# Patient Record
Sex: Male | Born: 1940
Health system: Southern US, Community
[De-identification: ages and names within clinical notes are randomized; demographics above are authoritative.]

## PROBLEM LIST (undated history)

## (undated) DIAGNOSIS — H409 Unspecified glaucoma: Secondary | ICD-10-CM

## (undated) DIAGNOSIS — H269 Unspecified cataract: Secondary | ICD-10-CM

## (undated) DIAGNOSIS — C9110 Chronic lymphocytic leukemia of B-cell type not having achieved remission: Principal | ICD-10-CM

## (undated) DIAGNOSIS — I4891 Unspecified atrial fibrillation: Secondary | ICD-10-CM

## (undated) HISTORY — DX: Chronic lymphocytic leukemia of B-cell type not having achieved remission: C91.10

## (undated) HISTORY — PX: TRANSTHORACIC ECHOCARDIOGRAM: SHX275

## (undated) HISTORY — PX: GUM SURGERY: SHX658

## (undated) HISTORY — DX: Unspecified cataract: H26.9

## (undated) HISTORY — PX: COLONOSCOPY: SHX174

## (undated) HISTORY — DX: Unspecified glaucoma: H40.9

## (undated) HISTORY — PX: TONSILLECTOMY: SUR1361

---

## 1997-08-17 HISTORY — PX: ROTATOR CUFF REPAIR: SHX139

## 1998-05-13 ENCOUNTER — Other Ambulatory Visit: Admission: RE | Admit: 1998-05-13 | Discharge: 1998-05-13 | Payer: Self-pay | Admitting: Hematology & Oncology

## 2001-12-12 ENCOUNTER — Encounter: Admission: RE | Admit: 2001-12-12 | Discharge: 2002-03-12 | Payer: Self-pay | Admitting: Internal Medicine

## 2004-10-28 ENCOUNTER — Ambulatory Visit: Payer: Self-pay | Admitting: Hematology & Oncology

## 2005-04-28 ENCOUNTER — Ambulatory Visit: Payer: Self-pay | Admitting: Hematology & Oncology

## 2005-11-04 ENCOUNTER — Ambulatory Visit: Payer: Self-pay | Admitting: Hematology & Oncology

## 2006-04-28 ENCOUNTER — Ambulatory Visit: Payer: Self-pay | Admitting: Hematology & Oncology

## 2006-04-30 LAB — CBC WITH DIFFERENTIAL/PLATELET
MCHC: 33.3 g/dL (ref 32.0–35.9)
MCV: 96.8 fL (ref 81.6–98.0)
Platelets: 207 10*3/uL (ref 145–400)
RBC: 4.17 10*6/uL — ABNORMAL LOW (ref 4.20–5.71)
RDW: 12.7 % (ref 11.2–14.6)
WBC: 24.8 10*3/uL — ABNORMAL HIGH (ref 4.0–10.0)

## 2006-04-30 LAB — MANUAL DIFFERENTIAL
ALC: 19.3 10*3/uL — ABNORMAL HIGH (ref 0.9–3.3)
ANC (CHCC manual diff): 5.2 10*3/uL (ref 1.5–6.5)
LYMPH: 78 % — ABNORMAL HIGH (ref 14–49)
MONO: 1 % (ref 0–14)
PLT EST: ADEQUATE
SEG: 21 % — ABNORMAL LOW (ref 38–77)
nRBC: 0 % (ref 0–0)

## 2006-10-26 ENCOUNTER — Ambulatory Visit: Payer: Self-pay | Admitting: Hematology & Oncology

## 2006-10-29 LAB — CBC WITH DIFFERENTIAL/PLATELET
BASO%: 0.2 % (ref 0.0–2.0)
Eosinophils Absolute: 0 10*3/uL (ref 0.0–0.5)
LYMPH%: 87.7 % — ABNORMAL HIGH (ref 14.0–48.0)
MCHC: 33.3 g/dL (ref 32.0–35.9)
MONO#: 0.2 10*3/uL (ref 0.1–0.9)
NEUT#: 2.3 10*3/uL (ref 1.5–6.5)
RBC: 4.62 10*6/uL (ref 4.20–5.71)
RDW: 15.3 % — ABNORMAL HIGH (ref 11.2–14.6)
WBC: 21.2 10*3/uL — ABNORMAL HIGH (ref 4.0–10.0)
lymph#: 18.6 10*3/uL — ABNORMAL HIGH (ref 0.9–3.3)

## 2006-10-29 LAB — CHCC SMEAR

## 2007-05-17 ENCOUNTER — Ambulatory Visit: Payer: Self-pay | Admitting: Hematology & Oncology

## 2007-05-20 LAB — CBC WITH DIFFERENTIAL/PLATELET
BASO%: 0.4 % (ref 0.0–2.0)
EOS%: 0.1 % (ref 0.0–7.0)
Eosinophils Absolute: 0 10*3/uL (ref 0.0–0.5)
MCH: 33 pg (ref 28.0–33.4)
MCHC: 34.3 g/dL (ref 32.0–35.9)
MCV: 96.4 fL (ref 81.6–98.0)
MONO%: 2.2 % (ref 0.0–13.0)
NEUT#: 3.7 10*3/uL (ref 1.5–6.5)
RBC: 4.42 10*6/uL (ref 4.20–5.71)
RDW: 13.9 % (ref 11.2–14.6)

## 2007-05-20 LAB — CHCC SMEAR

## 2007-10-31 ENCOUNTER — Ambulatory Visit: Payer: Self-pay | Admitting: Gastroenterology

## 2007-11-09 ENCOUNTER — Ambulatory Visit: Payer: Self-pay | Admitting: Internal Medicine

## 2007-11-16 ENCOUNTER — Ambulatory Visit: Payer: Self-pay | Admitting: Hematology & Oncology

## 2007-11-18 LAB — CBC WITH DIFFERENTIAL/PLATELET
BASO%: 0.2 % (ref 0.0–2.0)
EOS%: 0.1 % (ref 0.0–7.0)
HCT: 42.1 % (ref 38.7–49.9)
MCH: 32.1 pg (ref 28.0–33.4)
MCHC: 33.4 g/dL (ref 32.0–35.9)
NEUT%: 15.1 % — ABNORMAL LOW (ref 40.0–75.0)
lymph#: 20.3 10*3/uL — ABNORMAL HIGH (ref 0.9–3.3)

## 2008-05-17 ENCOUNTER — Ambulatory Visit: Payer: Self-pay | Admitting: Hematology & Oncology

## 2008-05-18 LAB — CBC WITH DIFFERENTIAL (CANCER CENTER ONLY)
EOS%: 0.2 % (ref 0.0–7.0)
LYMPH%: 86.9 % — ABNORMAL HIGH (ref 14.0–48.0)
MCH: 32.2 pg (ref 28.0–33.4)
MCHC: 33.7 g/dL (ref 32.0–35.9)
MCV: 95 fL (ref 82–98)
MONO%: 2.4 % (ref 0.0–13.0)
NEUT#: 2.5 10*3/uL (ref 1.5–6.5)
Platelets: 120 10*3/uL — ABNORMAL LOW (ref 145–400)
RDW: 11.5 % (ref 10.5–14.6)

## 2008-05-18 LAB — CHCC SATELLITE - SMEAR

## 2008-05-18 LAB — TECHNOLOGIST REVIEW CHCC SATELLITE

## 2008-07-19 ENCOUNTER — Ambulatory Visit: Payer: Self-pay | Admitting: Hematology & Oncology

## 2008-07-20 LAB — CBC WITH DIFFERENTIAL (CANCER CENTER ONLY)
BASO#: 1 10*3/uL — ABNORMAL HIGH (ref 0.0–0.2)
EOS%: 0.1 % (ref 0.0–7.0)
HCT: 44.4 % (ref 38.7–49.9)
HGB: 14.8 g/dL (ref 13.0–17.1)
LYMPH%: 87.8 % — ABNORMAL HIGH (ref 14.0–48.0)
MCH: 32.6 pg (ref 28.0–33.4)
MCHC: 33.4 g/dL (ref 32.0–35.9)
MCV: 98 fL (ref 82–98)
MONO%: 3 % (ref 0.0–13.0)
NEUT#: 2.3 10*3/uL (ref 1.5–6.5)
NEUT%: 6.4 % — ABNORMAL LOW (ref 40.0–80.0)

## 2008-07-24 LAB — IGG, IGA, IGM
IgA: 67 mg/dL — ABNORMAL LOW (ref 68–378)
IgG (Immunoglobin G), Serum: 888 mg/dL (ref 694–1618)

## 2008-07-24 LAB — PROTEIN ELECTROPHORESIS, SERUM
Albumin ELP: 64.9 % (ref 55.8–66.1)
Alpha-1-Globulin: 3.6 % (ref 2.9–4.9)
Alpha-2-Globulin: 9.7 % (ref 7.1–11.8)
Total Protein, Serum Electrophoresis: 6.6 g/dL (ref 6.0–8.3)

## 2008-08-17 HISTORY — PX: CYST REMOVAL TRUNK: SHX6283

## 2008-10-04 ENCOUNTER — Ambulatory Visit (HOSPITAL_BASED_OUTPATIENT_CLINIC_OR_DEPARTMENT_OTHER): Admission: RE | Admit: 2008-10-04 | Discharge: 2008-10-04 | Payer: Self-pay | Admitting: General Surgery

## 2008-10-04 ENCOUNTER — Encounter (INDEPENDENT_AMBULATORY_CARE_PROVIDER_SITE_OTHER): Payer: Self-pay | Admitting: General Surgery

## 2008-10-11 ENCOUNTER — Ambulatory Visit: Payer: Self-pay | Admitting: Hematology & Oncology

## 2008-10-12 LAB — CBC WITH DIFFERENTIAL (CANCER CENTER ONLY)
BASO#: 1.3 10*3/uL — ABNORMAL HIGH (ref 0.0–0.2)
EOS%: 0.2 % (ref 0.0–7.0)
Eosinophils Absolute: 0.1 10*3/uL (ref 0.0–0.5)
HCT: 47.1 % (ref 38.7–49.9)
HGB: 15.3 g/dL (ref 13.0–17.1)
LYMPH#: 31.8 10*3/uL — ABNORMAL HIGH (ref 0.9–3.3)
MCHC: 32.4 g/dL (ref 32.0–35.9)
NEUT#: 2.3 10*3/uL (ref 1.5–6.5)
NEUT%: 6.4 % — ABNORMAL LOW (ref 40.0–80.0)
RBC: 4.71 10*6/uL (ref 4.20–5.70)

## 2008-10-12 LAB — CHCC SATELLITE - SMEAR

## 2008-10-12 LAB — TECHNOLOGIST REVIEW CHCC SATELLITE

## 2008-12-20 ENCOUNTER — Ambulatory Visit: Payer: Self-pay | Admitting: Hematology & Oncology

## 2008-12-21 LAB — CBC WITH DIFFERENTIAL (CANCER CENTER ONLY)
BASO#: 0.5 10*3/uL — ABNORMAL HIGH (ref 0.0–0.2)
BASO%: 1.7 % (ref 0.0–2.0)
HCT: 42.1 % (ref 38.7–49.9)
HGB: 14 g/dL (ref 13.0–17.1)
LYMPH#: 23.2 10*3/uL — ABNORMAL HIGH (ref 0.9–3.3)
MONO#: 0.4 10*3/uL (ref 0.1–0.9)
NEUT#: 3 10*3/uL (ref 1.5–6.5)
NEUT%: 11.1 % — ABNORMAL LOW (ref 40.0–80.0)
RDW: 11.4 % (ref 10.5–14.6)
WBC: 27.2 10*3/uL — ABNORMAL HIGH (ref 4.0–10.0)

## 2008-12-21 LAB — TECHNOLOGIST REVIEW CHCC SATELLITE

## 2009-04-24 ENCOUNTER — Ambulatory Visit: Payer: Self-pay | Admitting: Hematology & Oncology

## 2009-04-25 LAB — CBC WITH DIFFERENTIAL (CANCER CENTER ONLY)
BASO%: 2.4 % — ABNORMAL HIGH (ref 0.0–2.0)
EOS%: 0.2 % (ref 0.0–7.0)
LYMPH#: 27.7 10*3/uL — ABNORMAL HIGH (ref 0.9–3.3)
MONO#: 0.7 10*3/uL (ref 0.1–0.9)
Platelets: 128 10*3/uL — ABNORMAL LOW (ref 145–400)
RDW: 11.8 % (ref 10.5–14.6)
WBC: 31.5 10*3/uL — ABNORMAL HIGH (ref 4.0–10.0)

## 2009-04-25 LAB — TECHNOLOGIST REVIEW CHCC SATELLITE

## 2009-08-27 ENCOUNTER — Ambulatory Visit: Payer: Self-pay | Admitting: Hematology & Oncology

## 2009-08-29 LAB — CBC WITH DIFFERENTIAL (CANCER CENTER ONLY)
BASO#: 0.6 10*3/uL — ABNORMAL HIGH (ref 0.0–0.2)
BASO%: 2 % (ref 0.0–2.0)
EOS%: 0.3 % (ref 0.0–7.0)
Eosinophils Absolute: 0.1 10*3/uL (ref 0.0–0.5)
HCT: 46.4 % (ref 38.7–49.9)
HGB: 15.1 g/dL (ref 13.0–17.1)
LYMPH#: 28.4 10*3/uL — ABNORMAL HIGH (ref 0.9–3.3)
LYMPH%: 88.7 % — ABNORMAL HIGH (ref 14.0–48.0)
MCH: 32 pg (ref 28.0–33.4)
MCHC: 32.6 g/dL (ref 32.0–35.9)
MCV: 98 fL (ref 82–98)
MONO#: 0.8 10*3/uL (ref 0.1–0.9)
MONO%: 2.4 % (ref 0.0–13.0)
NEUT#: 2.1 10*3/uL (ref 1.5–6.5)
NEUT%: 6.6 % — ABNORMAL LOW (ref 40.0–80.0)
Platelets: 120 10*3/uL — ABNORMAL LOW (ref 145–400)
RBC: 4.73 10*6/uL (ref 4.20–5.70)
RDW: 11.2 % (ref 10.5–14.6)
WBC: 32 10*3/uL — ABNORMAL HIGH (ref 4.0–10.0)

## 2009-08-29 LAB — CHCC SATELLITE - SMEAR

## 2009-12-31 ENCOUNTER — Ambulatory Visit: Payer: Self-pay | Admitting: Hematology & Oncology

## 2010-01-02 LAB — CBC WITH DIFFERENTIAL (CANCER CENTER ONLY)
BASO#: 0.8 10*3/uL — ABNORMAL HIGH (ref 0.0–0.2)
BASO%: 2.6 % — ABNORMAL HIGH (ref 0.0–2.0)
EOS%: 0.2 % (ref 0.0–7.0)
Eosinophils Absolute: 0.1 10*3/uL (ref 0.0–0.5)
HCT: 45.6 % (ref 38.7–49.9)
HGB: 15.1 g/dL (ref 13.0–17.1)
LYMPH#: 27.4 10*3/uL — ABNORMAL HIGH (ref 0.9–3.3)
LYMPH%: 87.7 % — ABNORMAL HIGH (ref 14.0–48.0)
MCH: 32.5 pg (ref 28.0–33.4)
MCHC: 33.1 g/dL (ref 32.0–35.9)
MCV: 98 fL (ref 82–98)
MONO#: 0.6 10*3/uL (ref 0.1–0.9)
MONO%: 2 % (ref 0.0–13.0)
NEUT#: 2.4 10*3/uL (ref 1.5–6.5)
NEUT%: 7.5 % — ABNORMAL LOW (ref 40.0–80.0)
Platelets: 103 10*3/uL — ABNORMAL LOW (ref 145–400)
RBC: 4.65 10*6/uL (ref 4.20–5.70)
RDW: 11.1 % (ref 10.5–14.6)
WBC: 31.3 10*3/uL — ABNORMAL HIGH (ref 4.0–10.0)

## 2010-01-02 LAB — CHCC SATELLITE - SMEAR

## 2010-04-29 ENCOUNTER — Ambulatory Visit: Payer: Self-pay | Admitting: Hematology & Oncology

## 2010-08-17 HISTORY — PX: CATARACT EXTRACTION: SUR2

## 2010-08-26 ENCOUNTER — Ambulatory Visit: Payer: Self-pay | Admitting: Hematology & Oncology

## 2010-08-28 LAB — CBC WITH DIFFERENTIAL (CANCER CENTER ONLY)
BASO#: 0.8 10*3/uL — ABNORMAL HIGH (ref 0.0–0.2)
BASO%: 2.4 % — ABNORMAL HIGH (ref 0.0–2.0)
EOS%: 0.2 % (ref 0.0–7.0)
Eosinophils Absolute: 0.1 10*3/uL (ref 0.0–0.5)
HCT: 46.7 % (ref 38.7–49.9)
HGB: 15.2 g/dL (ref 13.0–17.1)
LYMPH#: 30.4 10*3/uL — ABNORMAL HIGH (ref 0.9–3.3)
LYMPH%: 86.7 % — ABNORMAL HIGH (ref 14.0–48.0)
MCH: 32 pg (ref 28.0–33.4)
MCHC: 32.6 g/dL (ref 32.0–35.9)
MCV: 98 fL (ref 82–98)
MONO#: 0.9 10*3/uL (ref 0.1–0.9)
MONO%: 2.6 % (ref 0.0–13.0)
NEUT#: 2.8 10*3/uL (ref 1.5–6.5)
NEUT%: 8.1 % — ABNORMAL LOW (ref 40.0–80.0)
Platelets: 131 10*3/uL — ABNORMAL LOW (ref 145–400)
RBC: 4.75 10*6/uL (ref 4.20–5.70)
RDW: 10.8 % (ref 10.5–14.6)
WBC: 35.1 10*3/uL — ABNORMAL HIGH (ref 4.0–10.0)

## 2010-08-28 LAB — COMPREHENSIVE METABOLIC PANEL
ALT: 14 U/L (ref 0–53)
AST: 13 U/L (ref 0–37)
Albumin: 4.2 g/dL (ref 3.5–5.2)
Alkaline Phosphatase: 64 U/L (ref 39–117)
BUN: 16 mg/dL (ref 6–23)
CO2: 30 mEq/L (ref 19–32)
Calcium: 8.8 mg/dL (ref 8.4–10.5)
Chloride: 102 mEq/L (ref 96–112)
Creatinine, Ser: 0.97 mg/dL (ref 0.40–1.50)
Glucose, Bld: 244 mg/dL — ABNORMAL HIGH (ref 70–99)
Potassium: 4.3 mEq/L (ref 3.5–5.3)
Sodium: 142 mEq/L (ref 135–145)
Total Bilirubin: 0.5 mg/dL (ref 0.3–1.2)
Total Protein: 6.8 g/dL (ref 6.0–8.3)

## 2010-08-28 LAB — CHCC SATELLITE - SMEAR

## 2010-08-28 LAB — TECHNOLOGIST REVIEW CHCC SATELLITE

## 2010-12-02 LAB — POCT HEMOGLOBIN-HEMACUE: Hemoglobin: 14.6 g/dL (ref 13.0–17.0)

## 2010-12-11 ENCOUNTER — Encounter (HOSPITAL_BASED_OUTPATIENT_CLINIC_OR_DEPARTMENT_OTHER): Payer: Medicare Other | Admitting: Hematology & Oncology

## 2010-12-11 ENCOUNTER — Other Ambulatory Visit: Payer: Self-pay | Admitting: Hematology & Oncology

## 2010-12-11 DIAGNOSIS — H318 Other specified disorders of choroid: Secondary | ICD-10-CM

## 2010-12-11 DIAGNOSIS — C9110 Chronic lymphocytic leukemia of B-cell type not having achieved remission: Secondary | ICD-10-CM

## 2010-12-11 LAB — MANUAL DIFFERENTIAL (CHCC SATELLITE)
ALC: 23.8 10*3/uL — ABNORMAL HIGH (ref 0.9–3.3)
ANC (CHCC HP manual diff): 2.7 10*3/uL (ref 1.5–6.5)
Platelet Morphology: NORMAL

## 2010-12-11 LAB — CBC WITH DIFFERENTIAL (CANCER CENTER ONLY)
MCHC: 32.1 g/dL (ref 32.0–35.9)
Platelets: 115 10*3/uL — ABNORMAL LOW (ref 145–400)
RDW: 15.1 % (ref 11.1–15.7)

## 2010-12-11 LAB — LACTATE DEHYDROGENASE: LDH: 95 U/L (ref 94–250)

## 2010-12-11 LAB — CHCC SATELLITE - SMEAR

## 2010-12-30 NOTE — Op Note (Signed)
NAME:  Lucas [name], HANDEL NO.:  [id number]   MEDICAL RECORD NO.:  [id number]          PATIENT TYPE:  AMB   LOCATION:  DSC                          FACILITY:  [facility]   PHYSICIAN:  Gabrielle Dare. Janee Morn, M.D.DATE OF BIRTH:  10/12/1940   DATE OF PROCEDURE:  10/04/2008  DATE OF DISCHARGE:                               OPERATIVE REPORT   PREOPERATIVE DIAGNOSIS:  Sebaceous cyst, back.   POSTOPERATIVE DIAGNOSIS:  Sebaceous cyst, back.   PROCEDURE:  Excision of sebaceous cyst of the back.   SURGEON:  Gabrielle Dare. Janee Morn, M.D.   ANESTHESIA:  MAC.   HISTORY OF PRESENT ILLNESS:  Mr. Onstott is a 70 year old gentleman with  a history of CLL, who I initially saw in the office for an infected  sebaceous cyst.  He underwent incision and drainage and has completed a  course of antibiotics.  On followup, the acute infection was completely  resolved.  He was then scheduled for elective excision in light of his  medical history to prevent infectious complications and recurrence.   PROCEDURE IN DETAIL:  Informed consent was obtained.  The patient was  identified in the preop holding area.  His site was marked.  He was  brought to the operating room.  MAC anesthesia was administered by the  Anesthesia staff to his back.  He was prepped and draped in sterile  fashion.  A mixture of 1% lidocaine with epinephrine and 0.25% Marcaine  plain was injected under and around the cyst area.  An elliptical skin  incision was made to encompass the entirety of the old I and D site and  previous sinus.  Subcutaneous tissues were dissected down, and the cyst  was dissected out completely intact from the subcutaneous tissues.  It  did extend more cephalad.  This was sent to Pathology.  The wound was  copiously irrigated and meticulous hemostasis was obtained using Bovie  cautery.  The wound was then closed in layers with subcutaneous tissues  approximated with interrupted 3-0 Vicryl suture and the skin  closed with  running 4-0 Monocryl subcuticular stitch followed by Dermabond.  The  patient tolerated the procedure well without apparent complication.  He  was taken to recovery room in stable condition.      Gabrielle Dare Janee Morn, M.D.  Electronically Signed     BET/MEDQ  D:  10/04/2008  T:  10/04/2008  Job:  [id number]   cc:   Rose Phi. Myna Hidalgo, M.D.  Geoffry Paradise, M.D.

## 2011-04-10 ENCOUNTER — Other Ambulatory Visit: Payer: Self-pay | Admitting: Dermatology

## 2011-05-04 ENCOUNTER — Other Ambulatory Visit: Payer: Self-pay | Admitting: Hematology & Oncology

## 2011-05-04 ENCOUNTER — Encounter (HOSPITAL_BASED_OUTPATIENT_CLINIC_OR_DEPARTMENT_OTHER): Payer: Medicare Other | Admitting: Hematology & Oncology

## 2011-05-04 DIAGNOSIS — D696 Thrombocytopenia, unspecified: Secondary | ICD-10-CM

## 2011-05-04 DIAGNOSIS — C9110 Chronic lymphocytic leukemia of B-cell type not having achieved remission: Secondary | ICD-10-CM

## 2011-05-04 DIAGNOSIS — H318 Other specified disorders of choroid: Secondary | ICD-10-CM

## 2011-05-04 LAB — CHCC SATELLITE - SMEAR

## 2011-05-04 LAB — MANUAL DIFFERENTIAL (CHCC SATELLITE)
ALC: 26.6 10*3/uL — ABNORMAL HIGH (ref 0.9–3.3)
PLT EST ~~LOC~~: DECREASED
Platelet Morphology: NORMAL
SEG: 12 % — ABNORMAL LOW (ref 40–75)

## 2011-05-04 LAB — CBC WITH DIFFERENTIAL (CANCER CENTER ONLY)
Platelets: 105 10*3/uL — ABNORMAL LOW (ref 145–400)
RBC: 4.61 10*6/uL (ref 4.20–5.70)
RDW: 14.5 % (ref 11.1–15.7)
WBC: 30.6 10*3/uL — ABNORMAL HIGH (ref 4.0–10.0)

## 2011-05-15 ENCOUNTER — Other Ambulatory Visit: Payer: Self-pay | Admitting: Dermatology

## 2011-08-07 ENCOUNTER — Telehealth: Payer: Self-pay | Admitting: Hematology & Oncology

## 2011-08-07 ENCOUNTER — Telehealth: Payer: Self-pay | Admitting: *Deleted

## 2011-08-07 NOTE — Telephone Encounter (Signed)
I left a message telling him that NO live viral vaccines should be administered.  Yellow fever, oral typhoid, MMR, zoster and oral polio are live vaccines.  Any other vaccine should be ok.  I told him to call back with any questions.  Lucas [name]

## 2011-08-07 NOTE — Telephone Encounter (Signed)
Pt called requesting to speak with Dr Myna Hidalgo. He is going to [place] in Feb and there are questions regarding some of the injections he has to have because of his CLL. He can be reached at [phone]. Message given to Dr Myna Hidalgo.

## 2011-08-25 DIAGNOSIS — Z23 Encounter for immunization: Secondary | ICD-10-CM | POA: Diagnosis not present

## 2011-09-03 ENCOUNTER — Encounter: Payer: Self-pay | Admitting: Hematology & Oncology

## 2011-09-03 ENCOUNTER — Ambulatory Visit (HOSPITAL_BASED_OUTPATIENT_CLINIC_OR_DEPARTMENT_OTHER): Payer: Medicare Other | Admitting: Hematology & Oncology

## 2011-09-03 ENCOUNTER — Other Ambulatory Visit (HOSPITAL_BASED_OUTPATIENT_CLINIC_OR_DEPARTMENT_OTHER): Payer: Medicare Other | Admitting: Lab

## 2011-09-03 VITALS — BP 109/72 | HR 70 | Temp 97.6°F | Ht 71.25 in | Wt 168.0 lb

## 2011-09-03 DIAGNOSIS — C9110 Chronic lymphocytic leukemia of B-cell type not having achieved remission: Secondary | ICD-10-CM

## 2011-09-03 DIAGNOSIS — IMO0001 Reserved for inherently not codable concepts without codable children: Secondary | ICD-10-CM | POA: Insufficient documentation

## 2011-09-03 DIAGNOSIS — H318 Other specified disorders of choroid: Secondary | ICD-10-CM | POA: Diagnosis not present

## 2011-09-03 HISTORY — DX: Chronic lymphocytic leukemia of B-cell type not having achieved remission: C91.10

## 2011-09-03 LAB — MANUAL DIFFERENTIAL (CHCC SATELLITE)
ALC: 29.7 10*3/uL — ABNORMAL HIGH (ref 0.9–3.3)
PLT EST ~~LOC~~: DECREASED

## 2011-09-03 LAB — CBC WITH DIFFERENTIAL (CANCER CENTER ONLY)
MCV: 98 fL (ref 82–98)
Platelets: 105 10*3/uL — ABNORMAL LOW (ref 145–400)
RDW: 14.4 % (ref 11.1–15.7)
WBC: 32.3 10*3/uL — ABNORMAL HIGH (ref 4.0–10.0)

## 2011-09-03 NOTE — Progress Notes (Signed)
This office note has been dictated.

## 2011-09-03 NOTE — Progress Notes (Signed)
CC:   Geoffry Paradise, M.D. Bernette Redbird, M.D. Casper Harrison, MD Rodrigo Ran, OD  DIAGNOSES: 1. Chronic lymphocytic leukemia, stage B. 2. Chorioretinitis of the right eye. 3. Hematochezia.  CURRENT THERAPY:  Observation.  INTERIM HISTORY:  Mr. Turgeon comes in for followup.  We last saw him in September.  Since then, he has been doing okay.  Unfortunately, he has noted some bright red blood per rectum over the past couple days.  It is painless.  This has not happened to him before.  He says there is no blood on the tissue paper.  His last colonoscopy was about 4 years ago.  He has had no other issues.  He says his chorioretinitis is not much of a problem right now.  He is getting ready go to [place] in February.  I talked to him about which vaccines that he can and cannot take.  Given his CLL, live vaccines may not be a good idea for him.  He has had no cough or shortness of breath.  He had no problems over the holidays.  He has had a good appetite.  No nausea or vomiting.  PHYSICAL EXAM:  General:  This is a well-developed, well-nourished white gentleman in no obvious distress.  Vital Signs:  Temperature 97.6, pulse 70, respiratory rate 16, blood pressure 109/72.  Weight 168.  Head/Neck: Exam shows a normocephalic, atraumatic skull.  There are no ocular or oral lesions.  I did not notice any erythema or inflammation of the right.  Pupils react appropriately.  His left supraclavicular lymph node really is barely palpable.  I cannot palpate his left submandibular lymph node at this point time.  Lungs:  Clear bilaterally.  Cardiac: Regular rate and rhythm with a normal S1, S2.  There are no murmurs, rubs, or bruits.  Axillae:  Exam shows no bilateral axillary adenopathy. Abdomen:  Soft with good bowel sounds.  There is no fluid wave.  There is no guarding or rebound tenderness.  There is no palpable hepatosplenomegaly.  Rectal:  Exam shows very small external hemorrhoids.   The prostate is very smooth.  There are no rectal masses. His stool is brown with some focal areas of blood.  His guaiac, however, is negative.  Extremities:  No clubbing, cyanosis or edema.  Neurologic: No focal neurological deficits.  LABORATORY STUDIES:  White cell count is 32.1, hemoglobin 15.4, hematocrit 46, platelet count 105.  White cell differential shows 22 lymphocytes, 7 segs.  IMPRESSION:  Mr. Weissinger is a 71 year old gentleman with a history of CLL.  This really has not been an issue for him.  I have been following him for many years.  His white cell count has been trending up slowly, but again, he has been asymptomatic.  His lymphadenopathy seems to "wax and wane."  I am not sure what to make of his rectal bleeding.  Again, his stool looks like there were streaks of blood, but on the guaiac test, it was negative.  I did speak to Dr. Matthias Hughs of gastroenterology.  He would like to see Mr. Livsey for an evaluation and likely colonoscopy.  I gave Dr. Matthias Hughs Mr. Ormond's phone number.  Otherwise, we will have Mr. Kundrat come back to see [place] in 3-4 months.  I do not see any problems with him going over to [place] with respect to the CLL.  I do want to make sure, however, that this rectal issue is addressed.    ______________________________ Josph Macho, M.D. PRE/MEDQ  D:  09/03/2011  T:  09/03/2011  Job:  [id number]  ADDENDUM:  Mr. Dargis called and told us that he and wife had beets before he noted the "blood" in the stool.  This could explain the (-) guaiac test.

## 2011-09-08 LAB — PROTEIN ELECTROPHORESIS, SERUM
Albumin ELP: 64.3 % (ref 55.8–66.1)
Alpha-1-Globulin: 3.5 % (ref 2.9–4.9)
Alpha-2-Globulin: 9.6 % (ref 7.1–11.8)
Beta 2: 3.5 % (ref 3.2–6.5)
Beta Globulin: 5.3 % (ref 4.7–7.2)
Gamma Globulin: 13.8 % (ref 11.1–18.8)

## 2011-09-09 ENCOUNTER — Emergency Department (HOSPITAL_COMMUNITY): Payer: Medicare Other

## 2011-09-09 ENCOUNTER — Inpatient Hospital Stay (HOSPITAL_COMMUNITY)
Admission: EM | Admit: 2011-09-09 | Discharge: 2011-09-10 | DRG: 309 | Disposition: A | Discharge status: Home / Self Care | Payer: Medicare Other | Attending: Cardiovascular Disease | Admitting: Cardiovascular Disease

## 2011-09-09 ENCOUNTER — Encounter (HOSPITAL_COMMUNITY): Payer: Self-pay | Admitting: *Deleted

## 2011-09-09 DIAGNOSIS — Z91013 Allergy to seafood: Secondary | ICD-10-CM

## 2011-09-09 DIAGNOSIS — R5383 Other fatigue: Secondary | ICD-10-CM | POA: Diagnosis not present

## 2011-09-09 DIAGNOSIS — Z7982 Long term (current) use of aspirin: Secondary | ICD-10-CM

## 2011-09-09 DIAGNOSIS — R55 Syncope and collapse: Secondary | ICD-10-CM | POA: Diagnosis not present

## 2011-09-09 DIAGNOSIS — H209 Unspecified iridocyclitis: Secondary | ICD-10-CM | POA: Diagnosis not present

## 2011-09-09 DIAGNOSIS — R5381 Other malaise: Secondary | ICD-10-CM | POA: Diagnosis not present

## 2011-09-09 DIAGNOSIS — IMO0001 Reserved for inherently not codable concepts without codable children: Secondary | ICD-10-CM | POA: Diagnosis present

## 2011-09-09 DIAGNOSIS — H409 Unspecified glaucoma: Secondary | ICD-10-CM | POA: Diagnosis present

## 2011-09-09 DIAGNOSIS — C9110 Chronic lymphocytic leukemia of B-cell type not having achieved remission: Secondary | ICD-10-CM | POA: Diagnosis present

## 2011-09-09 DIAGNOSIS — Z79899 Other long term (current) drug therapy: Secondary | ICD-10-CM

## 2011-09-09 DIAGNOSIS — E119 Type 2 diabetes mellitus without complications: Secondary | ICD-10-CM | POA: Diagnosis present

## 2011-09-09 DIAGNOSIS — I4891 Unspecified atrial fibrillation: Secondary | ICD-10-CM | POA: Diagnosis not present

## 2011-09-09 DIAGNOSIS — Z87892 Personal history of anaphylaxis: Secondary | ICD-10-CM

## 2011-09-09 DIAGNOSIS — D7289 Other specified disorders of white blood cells: Secondary | ICD-10-CM | POA: Diagnosis not present

## 2011-09-09 DIAGNOSIS — Z9849 Cataract extraction status, unspecified eye: Secondary | ICD-10-CM

## 2011-09-09 DIAGNOSIS — I959 Hypotension, unspecified: Secondary | ICD-10-CM | POA: Diagnosis not present

## 2011-09-09 DIAGNOSIS — I480 Paroxysmal atrial fibrillation: Secondary | ICD-10-CM | POA: Diagnosis present

## 2011-09-09 DIAGNOSIS — R42 Dizziness and giddiness: Secondary | ICD-10-CM | POA: Diagnosis not present

## 2011-09-09 LAB — CBC
HCT: 45.4 % (ref 39.0–52.0)
Hemoglobin: 14.9 g/dL (ref 13.0–17.0)
MCH: 32 pg (ref 26.0–34.0)
MCHC: 32.8 g/dL (ref 30.0–36.0)
MCV: 97.6 fL (ref 78.0–100.0)
Platelets: 112 10*3/uL — ABNORMAL LOW (ref 150–400)
RBC: 4.65 MIL/uL (ref 4.22–5.81)
RDW: 14.5 % (ref 11.5–15.5)
WBC: 32.8 10*3/uL — ABNORMAL HIGH (ref 4.0–10.5)

## 2011-09-09 LAB — BASIC METABOLIC PANEL
BUN: 19 mg/dL (ref 6–23)
CO2: 28 mEq/L (ref 19–32)
Chloride: 105 mEq/L (ref 96–112)
GFR calc Af Amer: >90 mL/min (ref >90)
Potassium: 4.3 mEq/L (ref 3.5–5.1)

## 2011-09-09 LAB — DIFFERENTIAL
Basophils Absolute: 0 10*3/uL (ref 0.0–0.1)
Basophils Relative: 0 % (ref 0–1)
Eosinophils Absolute: 0 10*3/uL (ref 0.0–0.7)
Eosinophils Relative: 0 % (ref 0–5)
Lymphocytes Relative: 90 % — ABNORMAL HIGH (ref 12–46)
Lymphs Abs: 29.5 10*3/uL — ABNORMAL HIGH (ref 0.7–4.0)
Monocytes Absolute: 1 10*3/uL (ref 0.1–1.0)
Monocytes Relative: 3 % (ref 3–12)
Neutro Abs: 2.3 10*3/uL (ref 1.7–7.7)
Neutrophils Relative %: 7 % — ABNORMAL LOW (ref 43–77)

## 2011-09-09 LAB — TROPONIN I: Troponin I: <0.3 ng/mL (ref <0.30)

## 2011-09-09 MED ORDER — ASPIRIN 81 MG PO CHEW
324.0000 mg | CHEWABLE_TABLET | Freq: Once | ORAL | Status: AC
  Administered 2011-09-10: 324 mg via ORAL
  Filled 2011-09-09: qty 4

## 2011-09-09 NOTE — ED Provider Notes (Signed)
History     CSN: [id number]  Arrival date & time 09/09/11  2208   First MD Initiated Contact with Patient 09/09/11 2300      Chief Complaint  Patient presents with  . Dizziness    (Consider location/radiation/quality/duration/timing/severity/associated sxs/prior treatment) HPI Comments: The patient is a 71 year old male who presents for evaluation of near syncope, lightheadedness and feeling generalized weakness when first standing up. This lasts for several seconds before the lightheadedness and generalized weakness clear. He has no symptoms while seated or lying down. The lightheadedness is moderate to severe in intensity. It is not associated with chest pain, shortness of breath, nausea, vomiting, recent diarrhea, headache, or focal neurologic deficit. He denies any dizziness. He has no symptoms when lying down and moving his head from side to side. He has not had similar symptoms previously. The patient has a medical history of chronic lymphocytic leukemia, for which she sees Dr. Myna Hidalgo of oncology. Otherwise he is seen by Dr. Jacky Kindle for generalized medical care. He has no history of hypertension and takes no antihypertensives. He has no history of arrhythmia and no history of myocardial infarction or other heart problems including congestive heart failure. He is currently in no distress. She denies symptoms of palpitations or irregular heartbeat, but his wife says she checked his blood pressure and found it to be low, lower than usual, and his pulse to be irregular which prompted concern and their presentation to the emergency department.  The history is provided by the patient and the spouse.    Past Medical History  Diagnosis Date  . CLL (chronic lymphoblastic leukemia) 09/03/2011  . Chronic lymphocytic leukemia     dx 1999    Past Surgical History  Procedure Date  . Rotator cuff repair   . Cataract extraction     No family history on file.  History  Substance Use Topics    . Smoking status: Never Smoker   . Smokeless tobacco: Not on file  . Alcohol Use: Yes      Review of Systems  Constitutional: Negative for fever, chills and appetite change.  HENT: Negative for ear pain, congestion, sore throat, rhinorrhea, drooling, trouble swallowing, neck pain, neck stiffness, voice change and postnasal drip.   Eyes: Negative.  Negative for photophobia and visual disturbance.  Respiratory: Negative for cough, chest tightness, shortness of breath and wheezing.   Cardiovascular: Negative for chest pain and palpitations.  Gastrointestinal: Negative for nausea, vomiting, abdominal pain, diarrhea, constipation and abdominal distention.  Genitourinary: Negative.   Musculoskeletal: Negative for myalgias, back pain, joint swelling, arthralgias and gait problem.  Skin: Negative for color change, pallor, rash and wound.  Neurological: Positive for weakness. Negative for dizziness, tremors, seizures, syncope, facial asymmetry, speech difficulty, light-headedness, numbness and headaches.  Hematological: Negative.   Psychiatric/Behavioral: Negative.     Allergies  Other and Shellfish-derived products  Home Medications   Current Outpatient Rx  Name Route Sig Dispense Refill  . ASPIRIN 81 MG PO TABS Oral Take 81 mg by mouth daily.     Marland Kitchen BRIMONIDINE TARTRATE 0.2 % OP SOLN Left Eye Place 1 drop into the left eye 2 (two) times daily.     Marland Kitchen CETIRIZINE HCL 10 MG PO TABS Oral Take 10 mg by mouth daily.    Marland Kitchen TIMOLOL MALEATE 0.5 % OP SOLN Left Eye Place 1 drop into the left eye 2 (two) times daily. Left eye      BP 102/63  Pulse 103  Temp(Src) 98.4 F (  36.9 C) (Oral)  Resp 18  SpO2 97%  Physical Exam  Nursing note and vitals reviewed. Constitutional: He is oriented to person, place, and time. He appears well-developed and well-nourished. No distress.  HENT:  Head: Normocephalic and atraumatic.  Mouth/Throat: Oropharynx is clear and moist.  Eyes: EOM are normal. Pupils  are equal, round, and reactive to light.  Neck: Normal range of motion. Neck supple. No JVD present. No tracheal deviation present.  Cardiovascular: Normal rate, S1 normal, S2 normal, normal heart sounds and intact distal pulses.   No extrasystoles are present. PMI is not displaced.  Exam reveals no gallop and no friction rub.   No murmur heard.      The patient has an irregularly irregular heart rhythm, and atrial fibrillation is seen on the cardiac monitor, rate controlled. Peripheral pulses are full and palpable.  Pulmonary/Chest: Effort normal and breath sounds normal. No accessory muscle usage or stridor. Not tachypneic. No respiratory distress. He has no decreased breath sounds. He has no wheezes. He has no rhonchi. He has no rales. He exhibits no tenderness, no bony tenderness, no crepitus and no retraction.  Abdominal: Soft. Bowel sounds are normal. He exhibits no distension and no mass. There is no tenderness. There is no rebound and no guarding.  Musculoskeletal: Normal range of motion. He exhibits no edema and no tenderness.  Neurological: He is alert and oriented to person, place, and time. He has normal reflexes. He displays normal reflexes. No cranial nerve deficit. He exhibits normal muscle tone. Coordination normal.       No nystagmus  Skin: Skin is warm and dry. No rash noted. He is not diaphoretic. No erythema. No pallor.  Psychiatric: He has a normal mood and affect. His behavior is normal. Judgment and thought content normal.    ED Course  Procedures (including critical care time)    Date: 09/09/2011  Rate: 87  Rhythm: atrial fibrillation  QRS Axis: normal  Intervals: normal  ST/T Wave abnormalities: normal  Conduction Disutrbances:Atrial fibrillation  Narrative Interpretation: Presumably new onset atrial fibrillation without other abnormalities  Old EKG Reviewed: none available    Labs Reviewed  CBC  DIFFERENTIAL  BASIC METABOLIC PANEL  TROPONIN I  CK TOTAL AND  CKMB  PRO B NATRIURETIC PEPTIDE   No results found.   No diagnosis found.    MDM  Acute myocardial infarction, cardiomyopathy, congestive heart failure, atrial fibrillation-new onset, electrolyte abnormality, anemia are all entertained amongst other etiologies in the patient's differential diagnosis.        Felisa Bonier, MD 09/09/11 [phone]

## 2011-09-09 NOTE — ED Notes (Signed)
Pt states that since dinner tonight he has had dizziness when going to stand up.  His wife checked his BP and found that he was 99/70.  Pt denies any dizziness at any other time (other than when standing up quickly).  Pt is alert and oriented here in triage, he has not been sick lately

## 2011-09-10 ENCOUNTER — Encounter (HOSPITAL_COMMUNITY): Payer: Self-pay | Admitting: Cardiology

## 2011-09-10 ENCOUNTER — Other Ambulatory Visit: Payer: Self-pay

## 2011-09-10 DIAGNOSIS — E119 Type 2 diabetes mellitus without complications: Secondary | ICD-10-CM | POA: Diagnosis present

## 2011-09-10 DIAGNOSIS — H409 Unspecified glaucoma: Secondary | ICD-10-CM | POA: Diagnosis present

## 2011-09-10 DIAGNOSIS — Z91013 Allergy to seafood: Secondary | ICD-10-CM | POA: Diagnosis not present

## 2011-09-10 DIAGNOSIS — I4891 Unspecified atrial fibrillation: Secondary | ICD-10-CM | POA: Diagnosis not present

## 2011-09-10 DIAGNOSIS — I480 Paroxysmal atrial fibrillation: Secondary | ICD-10-CM | POA: Diagnosis present

## 2011-09-10 DIAGNOSIS — R55 Syncope and collapse: Secondary | ICD-10-CM | POA: Diagnosis not present

## 2011-09-10 DIAGNOSIS — Z87892 Personal history of anaphylaxis: Secondary | ICD-10-CM | POA: Diagnosis not present

## 2011-09-10 DIAGNOSIS — Z79899 Other long term (current) drug therapy: Secondary | ICD-10-CM | POA: Diagnosis not present

## 2011-09-10 DIAGNOSIS — C9110 Chronic lymphocytic leukemia of B-cell type not having achieved remission: Secondary | ICD-10-CM | POA: Diagnosis present

## 2011-09-10 DIAGNOSIS — Z9849 Cataract extraction status, unspecified eye: Secondary | ICD-10-CM | POA: Diagnosis not present

## 2011-09-10 DIAGNOSIS — Z7982 Long term (current) use of aspirin: Secondary | ICD-10-CM | POA: Diagnosis not present

## 2011-09-10 LAB — COMPREHENSIVE METABOLIC PANEL WITH GFR
ALT: 10 U/L (ref 0–53)
AST: 19 U/L (ref 0–37)
Albumin: 3.6 g/dL (ref 3.5–5.2)
Alkaline Phosphatase: 56 U/L (ref 39–117)
BUN: 17 mg/dL (ref 6–23)
CO2: 28 meq/L (ref 19–32)
Calcium: 8.7 mg/dL (ref 8.4–10.5)
Chloride: 105 meq/L (ref 96–112)
Creatinine, Ser: 0.9 mg/dL (ref 0.50–1.35)
GFR calc Af Amer: >90 mL/min
GFR calc non Af Amer: 84 mL/min — ABNORMAL LOW
Glucose, Bld: 136 mg/dL — ABNORMAL HIGH (ref 70–99)
Potassium: 5.1 meq/L (ref 3.5–5.1)
Sodium: 140 meq/L (ref 135–145)
Total Bilirubin: 0.3 mg/dL (ref 0.3–1.2)
Total Protein: 6.1 g/dL (ref 6.0–8.3)

## 2011-09-10 LAB — LIPID PANEL
HDL: 44 mg/dL (ref >39)
LDL Cholesterol: 107 mg/dL — ABNORMAL HIGH (ref 0–99)
Total CHOL/HDL Ratio: 3.8 RATIO
VLDL: 17 mg/dL (ref 0–40)

## 2011-09-10 LAB — CK TOTAL AND CKMB (NOT AT ARMC)
CK, MB: 2.4 ng/mL (ref 0.3–4.0)
Relative Index: INVALID (ref 0.0–2.5)

## 2011-09-10 LAB — PATHOLOGIST SMEAR REVIEW

## 2011-09-10 LAB — CARDIAC PANEL(CRET KIN+CKTOT+MB+TROPI)
CK, MB: 2.1 ng/mL (ref 0.3–4.0)
CK, MB: 2.1 ng/mL (ref 0.3–4.0)
Relative Index: INVALID (ref 0.0–2.5)
Relative Index: INVALID (ref 0.0–2.5)
Total CK: 62 U/L (ref 7–232)
Total CK: 55 U/L (ref 7–232)
Troponin I: <0.3 ng/mL
Troponin I: <0.3 ng/mL

## 2011-09-10 LAB — CBC
HCT: 44.8 % (ref 39.0–52.0)
Hemoglobin: 15.2 g/dL (ref 13.0–17.0)
MCH: 33.3 pg (ref 26.0–34.0)
MCHC: 33.9 g/dL (ref 30.0–36.0)
MCV: 98 fL (ref 78.0–100.0)
Platelets: 92 K/uL — ABNORMAL LOW (ref 150–400)
RBC: 4.57 MIL/uL (ref 4.22–5.81)
RDW: 14.6 % (ref 11.5–15.5)
WBC: 29.7 K/uL — ABNORMAL HIGH (ref 4.0–10.5)

## 2011-09-10 LAB — PROTIME-INR
INR: 1.1 (ref 0.00–1.49)
Prothrombin Time: 14.4 s (ref 11.6–15.2)

## 2011-09-10 LAB — HEMOGLOBIN A1C
Hgb A1c MFr Bld: 7.3 % — ABNORMAL HIGH
Mean Plasma Glucose: 163 mg/dL — ABNORMAL HIGH

## 2011-09-10 LAB — PRO B NATRIURETIC PEPTIDE: Pro B Natriuretic peptide (BNP): 172.8 pg/mL — ABNORMAL HIGH (ref 0–125)

## 2011-09-10 MED ORDER — DOCUSATE SODIUM 100 MG PO CAPS
100.0000 mg | ORAL_CAPSULE | Freq: Two times a day (BID) | ORAL | Status: DC
  Filled 2011-09-10 (×2): qty 1

## 2011-09-10 MED ORDER — ONDANSETRON HCL 4 MG/2ML IJ SOLN: 4.0000 mg | Freq: Four times a day (QID) | INTRAMUSCULAR | Status: DC | PRN

## 2011-09-10 MED ORDER — ASPIRIN EC 81 MG PO TBEC
81.0000 mg | DELAYED_RELEASE_TABLET | Freq: Every day | ORAL | Status: DC
Start: 2011-09-10 — End: 2011-09-10
  Administered 2011-09-10: 81 mg via ORAL
  Filled 2011-09-10: qty 1

## 2011-09-10 MED ORDER — LISINOPRIL 2.5 MG PO TABS
2.5000 mg | ORAL_TABLET | Freq: Every day | ORAL | Status: DC
  Filled 2011-09-10: qty 1

## 2011-09-10 MED ORDER — METOPROLOL SUCCINATE ER 25 MG PO TB24: ORAL_TABLET | ORAL | Status: DC

## 2011-09-10 MED ORDER — MORPHINE SULFATE 2 MG/ML IJ SOLN: 2.0000 mg | INTRAMUSCULAR | Status: DC | PRN

## 2011-09-10 MED ORDER — ACETAMINOPHEN 325 MG PO TABS: 650.0000 mg | ORAL_TABLET | ORAL | Status: DC | PRN

## 2011-09-10 MED ORDER — ZOLPIDEM TARTRATE 5 MG PO TABS: 5.0000 mg | ORAL_TABLET | Freq: Every evening | ORAL | Status: DC | PRN

## 2011-09-10 MED ORDER — ENOXAPARIN SODIUM 40 MG/0.4ML ~~LOC~~ SOLN
40.0000 mg | SUBCUTANEOUS | Status: DC
  Filled 2011-09-10: qty 0.4

## 2011-09-10 MED ORDER — TIMOLOL MALEATE 0.5 % OP SOLN
1.0000 [drp] | Freq: Two times a day (BID) | OPHTHALMIC | Status: DC
  Filled 2011-09-10: qty 5

## 2011-09-10 MED ORDER — ACETAMINOPHEN 325 MG PO TABS: 650.0000 mg | ORAL_TABLET | Freq: Four times a day (QID) | ORAL | Status: DC | PRN

## 2011-09-10 MED ORDER — SODIUM CHLORIDE 0.9 % IJ SOLN: 3.0000 mL | INTRAMUSCULAR | Status: DC | PRN

## 2011-09-10 MED ORDER — METOPROLOL TARTRATE 12.5 MG HALF TABLET
12.5000 mg | ORAL_TABLET | Freq: Two times a day (BID) | ORAL | Status: DC
  Filled 2011-09-10: qty 1

## 2011-09-10 MED ORDER — SODIUM CHLORIDE 0.9 % IV SOLN: 250.0000 mL | INTRAVENOUS | Status: DC | PRN

## 2011-09-10 MED ORDER — SODIUM CHLORIDE 0.9 % IJ SOLN
3.0000 mL | Freq: Two times a day (BID) | INTRAMUSCULAR | Status: DC
  Administered 2011-09-10: 3 mL via INTRAVENOUS

## 2011-09-10 MED ORDER — ALUM & MAG HYDROXIDE-SIMETH 200-200-20 MG/5ML PO SUSP: 30.0000 mL | Freq: Four times a day (QID) | ORAL | Status: DC | PRN

## 2011-09-10 MED ORDER — BRIMONIDINE TARTRATE 0.2 % OP SOLN
1.0000 [drp] | Freq: Two times a day (BID) | OPHTHALMIC | Status: DC
  Filled 2011-09-10: qty 5

## 2011-09-10 MED ORDER — METOPROLOL TARTRATE 12.5 MG HALF TABLET
12.5000 mg | ORAL_TABLET | Freq: Two times a day (BID) | ORAL | Status: DC
  Administered 2011-09-10: 12.5 mg via ORAL
  Filled 2011-09-10 (×2): qty 1

## 2011-09-10 MED ORDER — LORATADINE 10 MG PO TABS
10.0000 mg | ORAL_TABLET | Freq: Every day | ORAL | Status: DC
  Filled 2011-09-10: qty 1

## 2011-09-10 MED ORDER — OFF THE BEAT BOOK
Freq: Once | Status: AC
  Administered 2011-09-10: 04:00:00
  Filled 2011-09-10: qty 1

## 2011-09-10 NOTE — H&P (Signed)
Lucas [name] is an 71 y.o. male.   Chief Complaint: dizziness, near syncope HPI: Pt is a 71 y/o wm presenting to [facility] ED via POV after experiencing a couple near syncopal events with position change while at home. His BP was checked by his wife with a good Systolic pressure but irregular pulse. Pt denies any CP, increased WOB, Nausea, diaphoresis, fever, chills, or HA sx. W/u at [facility] noted the patient to be in new onset Afib rate controlled. Pt denies any h/o of A-fib, known CAD, prior MI, prior CVA, TIA, CKD. Thyroid D/z or CHF. Pt states he's had borderline DM diet controlled. Pt denies h/o of ETOH abuse of use of illicit drugs. Pt denies first generation Family hx of premature CAD. Pt also denies use of tobacco products.  Past Medical History  Diagnosis Date  . CLL (chronic lymphoblastic leukemia) 09/03/2011  . Chronic lymphocytic leukemia     dx 1999    Past Surgical History  Procedure Date  . Rotator cuff repair   . Cataract extraction     No family history on file. Social History:  reports that he has never smoked. He does not have any smokeless tobacco history on file. He reports that he drinks alcohol. He reports that he does not use illicit drugs.  Allergies:  Allergies  Allergen Reactions  . Other Anaphylaxis    Shellfish  . Shellfish-Derived Products Anaphylaxis    Medications Prior to Admission  Medication Dose Route Frequency Provider Last Rate Last Dose  . aspirin chewable tablet 324 mg  324 mg Oral Once Felisa Bonier, MD   324 mg at 09/10/11 0138   Medications Prior to Admission  Medication Sig Dispense Refill  . aspirin 81 MG tablet Take 81 mg by mouth daily.       . brimonidine (ALPHAGAN) 0.2 % ophthalmic solution Place 1 drop into the left eye 2 (two) times daily.       . timolol (TIMOPTIC) 0.5 % ophthalmic solution Place 1 drop into the left eye 2 (two) times daily. Left eye        Results for orders placed during the hospital encounter of 09/09/11  (from the past 48 hour(s))  CBC     Status: Abnormal   Collection Time   09/09/11 11:02 PM      Component Value Range Comment   WBC 32.8 (*) 4.0 - 10.5 (K/uL)    RBC 4.65  4.22 - 5.81 (MIL/uL)    Hemoglobin 14.9  13.0 - 17.0 (g/dL)    HCT 78.2  95.6 - 21.3 (%)    MCV 97.6  78.0 - 100.0 (fL)    MCH 32.0  26.0 - 34.0 (pg)    MCHC 32.8  30.0 - 36.0 (g/dL)    RDW 08.6  57.8 - 46.9 (%)    Platelets 112 (*) 150 - 400 (K/uL) PLATELET COUNT CONFIRMED BY SMEAR  DIFFERENTIAL     Status: Abnormal   Collection Time   09/09/11 11:02 PM      Component Value Range Comment   Neutrophils Relative 7 (*) 43 - 77 (%)    Lymphocytes Relative 90 (*) 12 - 46 (%)    Monocytes Relative 3  3 - 12 (%)    Eosinophils Relative 0  0 - 5 (%)    Basophils Relative 0  0 - 1 (%)    Neutro Abs 2.3  1.7 - 7.7 (K/uL)    Lymphs Abs 29.5 (*) 0.7 - 4.0 (  K/uL)    Monocytes Absolute 1.0  0.1 - 1.0 (K/uL)    Eosinophils Absolute 0.0  0.0 - 0.7 (K/uL)    Basophils Absolute 0.0  0.0 - 0.1 (K/uL)    WBC Morphology ATYPICAL LYMPHOCYTES   ABSOLUTE LYMPHOCYTOSIS  BASIC METABOLIC PANEL     Status: Abnormal   Collection Time   09/09/11 11:02 PM      Component Value Range Comment   Sodium 140  135 - 145 (mEq/L)    Potassium 4.3  3.5 - 5.1 (mEq/L)    Chloride 105  96 - 112 (mEq/L)    CO2 28  19 - 32 (mEq/L)    Glucose, Bld 142 (*) 70 - 99 (mg/dL)    BUN 19  6 - 23 (mg/dL)    Creatinine, Ser 1.61  0.50 - 1.35 (mg/dL)    Calcium 8.8  8.4 - 10.5 (mg/dL)    GFR calc non Af Amer 85 (*) >90 (mL/min)    GFR calc Af Amer >90  >90 (mL/min)   TROPONIN I     Status: Normal   Collection Time   09/09/11 11:02 PM      Component Value Range Comment   Troponin I <0.30  <0.30 (ng/mL)   CK TOTAL AND CKMB     Status: Normal   Collection Time   09/09/11 11:26 PM      Component Value Range Comment   Total CK 64  7 - 232 (U/L)    CK, MB 2.4  0.3 - 4.0 (ng/mL)    Relative Index RELATIVE INDEX IS INVALID  0.0 - 2.5    PRO B NATRIURETIC  PEPTIDE     Status: Abnormal   Collection Time   09/09/11 11:26 PM      Component Value Range Comment   Pro B Natriuretic peptide (BNP) 172.8 (*) 0 - 125 (pg/mL)    Dg Chest 2 View  09/10/2011  *RADIOLOGY REPORT*  Clinical Data: Dizziness and weakness; hypotension.  CHEST - 2 VIEW  Comparison: None.  Findings: The lungs are well-aerated and clear.  There is no evidence of focal opacification, pleural effusion or pneumothorax.  The heart is normal in size; the mediastinal contour is within normal limits.  No acute osseous abnormalities are seen.  IMPRESSION: No acute cardiopulmonary process seen.  Original Report Authenticated By: Tonia Ghent, M.D.    Review of Systems  Constitutional: Negative for fever, chills, weight loss, malaise/fatigue and diaphoresis.  HENT: Negative for ear pain, neck pain and tinnitus.   Eyes: Negative for blurred vision, double vision and photophobia.  Respiratory: Negative for cough, hemoptysis and sputum production.   Cardiovascular: Negative for chest pain, palpitations and leg swelling.  Gastrointestinal: Negative for heartburn, nausea, vomiting and abdominal pain.  Genitourinary: Negative for dysuria, urgency and frequency.  Musculoskeletal: Negative for myalgias and back pain.  Skin: Negative for itching and rash.  Neurological: Positive for dizziness. Negative for tingling, tremors, sensory change, speech change, focal weakness, seizures, loss of consciousness, weakness and headaches.  Psychiatric/Behavioral: Negative for depression, suicidal ideas and substance abuse. The patient is not nervous/anxious.     Blood pressure 94/63, pulse 91, temperature 98.1 F (36.7 C), temperature source Oral, resp. rate 14, SpO2 95.00%. Physical Exam   Gen: 71 y/o WM A&O x 3 spheres, NAD Integument" W/D Neck: soft, supple without JVD or palpable goiter Pulm: CTA Cardio: reg, irregular with audible S1, S2 Abd: soft with positive BS Ext: non edematous with DP 2/4  bilat  Neuro: CN 2 - 12 intact  Assessment/Plan 1) New onset A fib with controlled Ventricular rate, r/o ACS, Thyroid d/o, Cardiomyopathy 2)CLL, DX 1999 3)Glaucoma 4) S/p Cataract repair OD 5)Shoulder surgery 6) Borderline DM  SIMON,SPENCER E 09/10/2011, 2:17 AM   Agree with note written by Donell Sievert PAC  Pt admitted with dizziness and PAF with CVR. No CP or SOB. Enz neg. Converted spontaneously to NSR. H/O low BP and CVLL followed by Arlan Organ. Exam benign. Labs OK. EKG without STTWC. OK to D/C home. Will get OP 2D echo and myoview. No need for coumadin A/C. Continue ASA. Low dose BB. Pt has a trip to [place] upcoming which I feel he can still go on at low risk.  Runell Gess 09/10/2011 11:38 AM

## 2011-09-10 NOTE — Discharge Summary (Signed)
Physician Discharge Summary  Patient ID: Lucas [name] MRN: [id number] DOB/AGE: 1941/08/16 71 y.o.  Admit date: 09/09/2011 Discharge date: 09/10/2011  Discharge Diagnoses:  Principal Problem:  *Atrial fibrillation, new onset, converted to SR,Sbrady. Active Problems:  Diabetes mellitus,borderline  CLL (chronic lymphoblastic leukemia)Dx. 1999   Discharged Condition: good  Hospital Course: 71 year old male presented to the emergency room after experiencing a couple of near syncopal events with position change at home. He has no previous history of atrial failure no cardiac disease that he is aware of either.  He does have a history of chronic lymphoblastic leukemia diagnosed in 1999.  On arrival to the emergency room he was found to be in atrial fibrillation with controlled ventricular rate.  He also had some dizziness prior to admission. Cardiac enzymes have been negative.  Patient will ambulate.  He has converted to sinus rhythm slight sinus bradycardia. He denies any chest pain. Dr. Allyson Sabal he has seen and examined the patient he is stable, plans will be to discharge home now on low-dose beta blocker Toprol-XL 12 and half milligrams daily on an as an outpatient he will have a 2-D echo as well as a exercise Myoview.  We will try and do these studies within the next 2 weeks as patient has a trip planned to [place] in 3 weeks.  Consults: None  Significant Diagnostic Studies:  At discharge sodium 140 potassium 5.1 chloride 105 CO2 28 BUN 17 creatinine 0.90 calcium 8.7 alkaline phosphatase 6 albumin 3.6 AST 19 ALT 10 total protein 6.1 total bilirubin 0.3  Cardiac enzymes are negative with CKs 64-62, MB is 2.4-2.1, and troponin I less than 0.30x2. ProBNP 172.  Total cholesterol 168 triglycerides 84 HDL 44 LDL 107  Hemoglobin 15.2 hematocrit 44.8 CBC 29.7 platelets 92  Pro time 14.4 INR 1.1 Hemoglobin A1c 7.3  TSH 4.714.  Two-view chest x-ray: No acute cardiopulmonary process  seen  EKG: Atrial fib.with no acute ST changes, heart rate controlled.  Discharge Exam: Blood pressure 104/68, pulse 72, temperature 97.2 F (36.2 C), temperature source Oral, resp. rate 16, height 6' (1.829 m), weight 77.656 kg (171 lb 3.2 oz), SpO2 98.00%.  see H&P only change is now heart rate Regular.  Disposition: home   Discharge Orders    Future Appointments: Provider: Department: Dept Phone: Center:   11/26/2011 8:30 AM Gwendolyn A. Maisie Fus [place] [phone] None   11/26/2011 9:00 AM Josph Macho, MD [facility] [phone] None     Medication List  As of 09/10/2011 12:30 PM   STOP taking these medications         DUREZOL 0.05 % Emul         TAKE these medications         aspirin 81 MG tablet   Take 81 mg by mouth daily.      brimonidine 0.2 % ophthalmic solution   Commonly known as: ALPHAGAN   Place 1 drop into the left eye 2 (two) times daily.      cetirizine 10 MG tablet   Commonly known as: ZYRTEC   Take 10 mg by mouth daily.      metoprolol succinate 25 MG 24 hr tablet   Commonly known as: TOPROL-XL   Take half a tablet daily to equal 12.5 mg daily.      timolol 0.5 % ophthalmic solution   Commonly known as: TIMOPTIC   Place 1 drop into the left eye 2 (two) times daily. Left eye  Follow-up Information    Follow up with ARONSON,[name], MD.      Follow up with Runell Gess, MD on 09/15/2011. (at 1:00 pm for stress test and ultrasound of your heart.)    Contact information:   [address] [place] [address] [phone]       Follow up with Runell Gess, MD on 09/16/2011. (at 2:00 pm with Dr. Allyson Sabal to review tests)    Contact information:   [address] [place] [address] [phone]        call if you have any problems prior to the appointment. Heart healthy diabetic diet. The day of stress testing do not eat or drink after 6 AM do not wear cologne to  test or strong deodorant. Were walking shoes. Do not take Metoprolol on the 28th where the 29th until after the test.  Signed: Sou Nohr R 09/10/2011, 12:30 PM

## 2011-09-10 NOTE — ED Notes (Signed)
Patient transported to X-ray 

## 2011-09-10 NOTE — ED Notes (Signed)
Patient denies pain and is resting comfortably.  

## 2011-09-14 DIAGNOSIS — H2510 Age-related nuclear cataract, unspecified eye: Secondary | ICD-10-CM | POA: Diagnosis not present

## 2011-09-15 DIAGNOSIS — I4891 Unspecified atrial fibrillation: Secondary | ICD-10-CM | POA: Diagnosis not present

## 2011-09-15 DIAGNOSIS — I10 Essential (primary) hypertension: Secondary | ICD-10-CM | POA: Diagnosis not present

## 2011-09-15 DIAGNOSIS — R9431 Abnormal electrocardiogram [ECG] [EKG]: Secondary | ICD-10-CM | POA: Diagnosis not present

## 2011-09-15 DIAGNOSIS — E039 Hypothyroidism, unspecified: Secondary | ICD-10-CM | POA: Diagnosis not present

## 2011-09-15 DIAGNOSIS — E119 Type 2 diabetes mellitus without complications: Secondary | ICD-10-CM | POA: Diagnosis not present

## 2011-09-15 HISTORY — PX: CARDIOVASCULAR STRESS TEST: SHX262

## 2011-09-16 ENCOUNTER — Other Ambulatory Visit: Payer: Self-pay | Admitting: Cardiovascular Disease

## 2011-09-16 ENCOUNTER — Encounter (HOSPITAL_COMMUNITY): Payer: Self-pay | Admitting: Pharmacy Technician

## 2011-09-16 DIAGNOSIS — R9439 Abnormal result of other cardiovascular function study: Secondary | ICD-10-CM | POA: Diagnosis not present

## 2011-09-16 DIAGNOSIS — I4891 Unspecified atrial fibrillation: Secondary | ICD-10-CM | POA: Diagnosis not present

## 2011-09-17 ENCOUNTER — Ambulatory Visit (HOSPITAL_COMMUNITY)
Admission: RE | Admit: 2011-09-17 | Discharge: 2011-09-17 | Disposition: A | Discharge status: Home / Self Care | Payer: Medicare Other | Source: Ambulatory Visit | Attending: Cardiovascular Disease | Admitting: Cardiovascular Disease

## 2011-09-17 ENCOUNTER — Encounter (HOSPITAL_COMMUNITY)
Admission: RE | Disposition: A | Discharge status: Home / Self Care | Payer: Self-pay | Source: Ambulatory Visit | Attending: Cardiovascular Disease

## 2011-09-17 DIAGNOSIS — R9439 Abnormal result of other cardiovascular function study: Secondary | ICD-10-CM | POA: Insufficient documentation

## 2011-09-17 DIAGNOSIS — R9430 Abnormal result of cardiovascular function study, unspecified: Secondary | ICD-10-CM | POA: Diagnosis not present

## 2011-09-17 DIAGNOSIS — I4891 Unspecified atrial fibrillation: Secondary | ICD-10-CM | POA: Insufficient documentation

## 2011-09-17 HISTORY — PX: LEFT HEART CATHETERIZATION WITH CORONARY ANGIOGRAM: SHX5451

## 2011-09-17 SURGERY — LEFT HEART CATHETERIZATION WITH CORONARY ANGIOGRAM
Anesthesia: LOCAL

## 2011-09-17 MED ORDER — FAMOTIDINE IN NACL 20-0.9 MG/50ML-% IV SOLN
INTRAVENOUS | Status: AC
  Filled 2011-09-17: qty 50

## 2011-09-17 MED ORDER — METHYLPREDNISOLONE SODIUM SUCC 125 MG IJ SOLR
60.0000 mg | INTRAMUSCULAR | Status: AC
  Administered 2011-09-17: 60 mg via INTRAVENOUS

## 2011-09-17 MED ORDER — DIAZEPAM 5 MG PO TABS
5.0000 mg | ORAL_TABLET | ORAL | Status: AC
  Administered 2011-09-17: 5 mg via ORAL

## 2011-09-17 MED ORDER — LIDOCAINE HCL (PF) 1 % IJ SOLN
INTRAMUSCULAR | Status: AC
  Filled 2011-09-17: qty 30

## 2011-09-17 MED ORDER — DIPHENHYDRAMINE HCL 50 MG/ML IJ SOLN
INTRAMUSCULAR | Status: AC
  Filled 2011-09-17: qty 1

## 2011-09-17 MED ORDER — METHYLPREDNISOLONE SODIUM SUCC 125 MG IJ SOLR
INTRAMUSCULAR | Status: AC
  Filled 2011-09-17: qty 2

## 2011-09-17 MED ORDER — DIPHENHYDRAMINE HCL 50 MG/ML IJ SOLN
25.0000 mg | INTRAMUSCULAR | Status: AC
  Administered 2011-09-17: 25 mg via INTRAVENOUS

## 2011-09-17 MED ORDER — HEPARIN (PORCINE) IN NACL 2-0.9 UNIT/ML-% IJ SOLN
INTRAMUSCULAR | Status: AC
  Filled 2011-09-17: qty 2000

## 2011-09-17 MED ORDER — SODIUM CHLORIDE 0.9 % IV SOLN
INTRAVENOUS | Status: DC
  Administered 2011-09-17: 15:00:00 via INTRAVENOUS

## 2011-09-17 MED ORDER — NITROGLYCERIN 0.2 MG/ML ON CALL CATH LAB
INTRAVENOUS | Status: AC
  Filled 2011-09-17: qty 1

## 2011-09-17 MED ORDER — SODIUM CHLORIDE 0.9 % IJ SOLN: 3.0000 mL | INTRAMUSCULAR | Status: DC | PRN

## 2011-09-17 MED ORDER — DIAZEPAM 5 MG PO TABS
ORAL_TABLET | ORAL | Status: AC
  Filled 2011-09-17: qty 1

## 2011-09-17 MED ORDER — HEPARIN SODIUM (PORCINE) 1000 UNIT/ML IJ SOLN
INTRAMUSCULAR | Status: AC
  Filled 2011-09-17: qty 1

## 2011-09-17 MED ORDER — FAMOTIDINE IN NACL 20-0.9 MG/50ML-% IV SOLN
20.0000 mg | INTRAVENOUS | Status: AC
  Administered 2011-09-17: 20 mg via INTRAVENOUS

## 2011-09-17 MED ORDER — VERAPAMIL HCL 2.5 MG/ML IV SOLN
INTRAVENOUS | Status: AC
  Filled 2011-09-17: qty 2

## 2011-09-17 NOTE — Op Note (Signed)
Lucas [name] is a 71 y.o. male    [id number] LOCATION:  FACILITY: [facility]  PHYSICIAN: Nanetta Batty, M.D. May 25, 1941   DATE OF PROCEDURE:  09/17/2011  DATE OF DISCHARGE:  [facility]  CARDIAC CATHETERIZATION     History obtained from chart review. [name] is a 44-year-old married Caucasian male father one child who has a history of paroxysmal atrial fibrillation spontaneously converting to sinus rhythm during admission overnight January 23 of January 24 of this year. He had a Myoview stress test that showed mild ischemia in the RCA territory. He is  scheduled to go to [place] in 2 weeks. He presents now for outpatient diagnostic coronary arteriography with her. The right radial approach at the fundus anatomy rule out an ischemic etiology.   PROCEDURE DESCRIPTION:    The patient was brought to the second floor  [place] [facility] cath lab in the postabsorptive state. He was  premedicated with Valium 5 mg by mouth. His right wrist was prepped and shaved in usual sterile fashion. Xylocaine 1% was used  for local anesthesia. A 5 French sheath was inserted into the right radial  artery using standard Seldinger technique. The patient received  5000 units  of heparin  intravenously.  5 [place] TIG catheter along with 5 French pigtail catheter were used for selective coronary angiography and left ventriculography respectively. Visipaque dye was used for the entirety of the case. Retrograde aortic, left ventricular and pullback pressures were recorded.    HEMODYNAMICS:    AO SYSTOLIC/AO DIASTOLIC: 115/69   LV SYSTOLIC/LV DIASTOLIC: 111/13  ANGIOGRAPHIC RESULTS:   1. Left main; normal  2. LAD; normal 3. Left circumflex; normal.  4. Right coronary artery; normal 5. Left ventriculography; RAO left ventriculogram was performed using  25 mL of Visipaque dye at 12 mL/second. The overall LVEF estimated  60 %  With/Without wall motion  abnormalities  IMPRESSION:[name] has normal coronary arteries and normal left ventricular function. I believe his  Positive Myoview stress test was false positive. The right radial sheath was removed and a TR band was placed on the right wrist chief patent hemostasis. The patient left the lab in stable condition. He'll be discharged home today as an outpatient and will see back in the office in 2 weeks, prior to his departure to [place].  Runell Gess MD, [facility] 09/17/2011 3:41 PM

## 2011-09-17 NOTE — H&P (Signed)
H & P will be scanned in.  Pt was reexamined and existing H & P reviewed. No changes found.  Runell Gess, MD [facility] 09/17/2011 3:16 PM

## 2011-09-28 DIAGNOSIS — I4891 Unspecified atrial fibrillation: Secondary | ICD-10-CM | POA: Diagnosis not present

## 2011-09-30 DIAGNOSIS — H401290 Low-tension glaucoma, unspecified eye, stage unspecified: Secondary | ICD-10-CM | POA: Diagnosis not present

## 2011-09-30 DIAGNOSIS — H2010 Chronic iridocyclitis, unspecified eye: Secondary | ICD-10-CM | POA: Diagnosis not present

## 2011-09-30 DIAGNOSIS — H01009 Unspecified blepharitis unspecified eye, unspecified eyelid: Secondary | ICD-10-CM | POA: Diagnosis not present

## 2011-10-02 DIAGNOSIS — H2010 Chronic iridocyclitis, unspecified eye: Secondary | ICD-10-CM | POA: Diagnosis not present

## 2011-10-29 ENCOUNTER — Other Ambulatory Visit: Payer: Self-pay | Admitting: Dermatology

## 2011-10-29 DIAGNOSIS — L905 Scar conditions and fibrosis of skin: Secondary | ICD-10-CM | POA: Diagnosis not present

## 2011-10-29 DIAGNOSIS — D239 Other benign neoplasm of skin, unspecified: Secondary | ICD-10-CM | POA: Diagnosis not present

## 2011-10-29 DIAGNOSIS — D485 Neoplasm of uncertain behavior of skin: Secondary | ICD-10-CM | POA: Diagnosis not present

## 2011-10-29 DIAGNOSIS — L570 Actinic keratosis: Secondary | ICD-10-CM | POA: Diagnosis not present

## 2011-10-29 DIAGNOSIS — L820 Inflamed seborrheic keratosis: Secondary | ICD-10-CM | POA: Diagnosis not present

## 2011-11-11 DIAGNOSIS — H209 Unspecified iridocyclitis: Secondary | ICD-10-CM | POA: Diagnosis not present

## 2011-11-13 DIAGNOSIS — L570 Actinic keratosis: Secondary | ICD-10-CM | POA: Diagnosis not present

## 2011-11-24 DIAGNOSIS — H1030 Unspecified acute conjunctivitis, unspecified eye: Secondary | ICD-10-CM | POA: Diagnosis not present

## 2011-11-26 ENCOUNTER — Other Ambulatory Visit (HOSPITAL_BASED_OUTPATIENT_CLINIC_OR_DEPARTMENT_OTHER): Payer: Medicare Other | Admitting: Lab

## 2011-11-26 ENCOUNTER — Other Ambulatory Visit: Payer: Self-pay

## 2011-11-26 ENCOUNTER — Ambulatory Visit (HOSPITAL_BASED_OUTPATIENT_CLINIC_OR_DEPARTMENT_OTHER): Payer: Medicare Other | Admitting: Hematology & Oncology

## 2011-11-26 ENCOUNTER — Emergency Department (HOSPITAL_COMMUNITY): Payer: Medicare Other

## 2011-11-26 ENCOUNTER — Encounter (HOSPITAL_COMMUNITY): Payer: Self-pay | Admitting: *Deleted

## 2011-11-26 ENCOUNTER — Emergency Department (HOSPITAL_COMMUNITY)
Admission: EM | Admit: 2011-11-26 | Discharge: 2011-11-26 | Disposition: A | Discharge status: Home / Self Care | Payer: Medicare Other | Attending: Emergency Medicine | Admitting: Emergency Medicine

## 2011-11-26 VITALS — BP 112/70 | HR 77 | Temp 97.6°F | Ht 72.0 in | Wt 167.0 lb

## 2011-11-26 DIAGNOSIS — Z832 Family history of diseases of the blood and blood-forming organs and certain disorders involving the immune mechanism: Secondary | ICD-10-CM

## 2011-11-26 DIAGNOSIS — R0602 Shortness of breath: Secondary | ICD-10-CM | POA: Diagnosis not present

## 2011-11-26 DIAGNOSIS — R0789 Other chest pain: Secondary | ICD-10-CM | POA: Insufficient documentation

## 2011-11-26 DIAGNOSIS — C9110 Chronic lymphocytic leukemia of B-cell type not having achieved remission: Secondary | ICD-10-CM

## 2011-11-26 DIAGNOSIS — E119 Type 2 diabetes mellitus without complications: Secondary | ICD-10-CM | POA: Insufficient documentation

## 2011-11-26 DIAGNOSIS — I2510 Atherosclerotic heart disease of native coronary artery without angina pectoris: Secondary | ICD-10-CM | POA: Diagnosis not present

## 2011-11-26 DIAGNOSIS — I4891 Unspecified atrial fibrillation: Secondary | ICD-10-CM | POA: Diagnosis not present

## 2011-11-26 DIAGNOSIS — R079 Chest pain, unspecified: Secondary | ICD-10-CM | POA: Diagnosis not present

## 2011-11-26 DIAGNOSIS — R5381 Other malaise: Secondary | ICD-10-CM | POA: Insufficient documentation

## 2011-11-26 DIAGNOSIS — R918 Other nonspecific abnormal finding of lung field: Secondary | ICD-10-CM | POA: Diagnosis not present

## 2011-11-26 HISTORY — DX: Unspecified atrial fibrillation: I48.91

## 2011-11-26 LAB — MANUAL DIFFERENTIAL (CHCC SATELLITE)
ALC: 19.2 10*3/uL — ABNORMAL HIGH (ref 0.9–3.3)
ANC (CHCC HP manual diff): 3.3 10*3/uL (ref 1.5–6.5)
PLT EST ~~LOC~~: DECREASED
Platelet Morphology: NORMAL
SEG: 14 % — ABNORMAL LOW (ref 40–75)

## 2011-11-26 LAB — POCT I-STAT TROPONIN I
Troponin i, poc: 0.01 ng/mL (ref 0.00–0.08)
Troponin i, poc: 0 ng/mL (ref 0.00–0.08)

## 2011-11-26 LAB — BASIC METABOLIC PANEL
BUN: 14 mg/dL (ref 6–23)
Chloride: 101 mEq/L (ref 96–112)
Creatinine, Ser: 0.88 mg/dL (ref 0.50–1.35)
GFR calc Af Amer: >90 mL/min (ref >90)
Glucose, Bld: 144 mg/dL — ABNORMAL HIGH (ref 70–99)

## 2011-11-26 LAB — IFE INTERPRETATION

## 2011-11-26 LAB — CBC
HCT: 43.8 % (ref 39.0–52.0)
Hemoglobin: 14.1 g/dL (ref 13.0–17.0)
MCV: 98.6 fL (ref 78.0–100.0)
RDW: 13.9 % (ref 11.5–15.5)
WBC: 25.1 10*3/uL — ABNORMAL HIGH (ref 4.0–10.5)

## 2011-11-26 LAB — CBC WITH DIFFERENTIAL (CANCER CENTER ONLY)
HCT: 43.7 % (ref 38.7–49.9)
HGB: 14.2 g/dL (ref 13.0–17.1)
MCH: 32.1 pg (ref 28.0–33.4)
MCHC: 32.5 g/dL (ref 32.0–35.9)

## 2011-11-26 LAB — CHCC SATELLITE - SMEAR

## 2011-11-26 MED ORDER — DIPHENHYDRAMINE HCL 25 MG PO CAPS
50.0000 mg | ORAL_CAPSULE | Freq: Once | ORAL | Status: AC
  Administered 2011-11-26: 50 mg via ORAL
  Filled 2011-11-26: qty 2

## 2011-11-26 MED ORDER — IOHEXOL 350 MG/ML SOLN
100.0000 mL | Freq: Once | INTRAVENOUS | Status: AC | PRN
  Administered 2011-11-26: 100 mL via INTRAVENOUS

## 2011-11-26 NOTE — Discharge Instructions (Signed)
Chest Pain, Nonspecific  It is often hard to give a specific diagnosis for the cause of chest pain. There is always a chance that your pain could be related to something serious, like a heart attack or a blood clot in the lungs. You need to follow up with your caregiver for further evaluation. More lab tests or other studies such as X-rays, electrocardiography, stress testing, or cardiac imaging may be needed to find the cause of your pain.  Most of the time, nonspecific chest pain improves within 2 to 3 days with rest and mild pain medicine. For the next few days, avoid physical exertion or activities that bring on pain. Do not smoke. Avoid drinking alcohol. Call your caregiver for routine follow-up as advised.   SEEK IMMEDIATE MEDICAL CARE IF:   You develop increased chest pain or pain that radiates to the arm, neck, jaw, back, or abdomen.   You develop shortness of breath, increased coughing, or you start coughing up blood.   You have severe back or abdominal pain, nausea, or vomiting.   You develop severe weakness, fainting, fever, or chills.  Document Released: 08/03/2005 Document Revised: 07/23/2011 Document Reviewed: 01/21/2007  Exit[employer] Patient Information 2012 [employer].

## 2011-11-26 NOTE — ED Notes (Signed)
Spoke with Dr. Estill Bamberg (radiologist).  He stated the patient is safe to have a CTA.  Advised Dr. Anitra Lauth.

## 2011-11-26 NOTE — ED Notes (Signed)
Patient is AOx4 and comfortable with his discharge instructions. 

## 2011-11-26 NOTE — ED Notes (Signed)
Pt states that he was driving and had sudden onset of CP.  Pt states that pain went away.  Pt denies any pain at this time. Pt denies any SOB, nausea or any other symptoms.

## 2011-11-26 NOTE — ED Notes (Signed)
Advised MD the patient is allergic to shellfish (causes his throat to close) since he is due to have a CTA.  MD advised me to ask radiology what is the protocol for this situation.

## 2011-11-26 NOTE — ED Provider Notes (Addendum)
History     CSN: [id number]  Arrival date & time 11/26/11  1478   First MD Initiated Contact with Patient 11/26/11 1917      Chief Complaint  Patient presents with  . Chest Pain    (Consider location/radiation/quality/duration/timing/severity/associated sxs/prior treatment) Patient is a 71 y.o. male presenting with chest pain. The history is provided by the patient.  Chest Pain The chest pain began 3 - 5 hours ago. Duration of episode(s) is 4 seconds. Chest pain occurs intermittently. The chest pain is resolved. Associated with: nothing. At its most intense, the pain is at 4/10. The pain is currently at 0/10. The severity of the pain is mild. The quality of the pain is described as brief, sharp and stabbing. The pain does not radiate. Exacerbated by: nothing but causes tingling in both arms. Pertinent negatives for primary symptoms include no fever, no syncope, no shortness of breath, no cough, no wheezing, no palpitations, no abdominal pain, no nausea, no vomiting and no dizziness.  Pertinent negatives for associated symptoms include no lower extremity edema. He tried nothing for the symptoms. Risk factors: Recent travel from [place].  His family medical history is significant for PE in family.     Past Medical History  Diagnosis Date  . CLL (chronic lymphoblastic leukemia) 09/03/2011  . Chronic lymphocytic leukemia     dx 1999  . Diabetes mellitus,borderline 09/10/2011  . Atrial fibrillation     Past Surgical History  Procedure Date  . Rotator cuff repair   . Cataract extraction   . Cardiac catheterization     History reviewed. No pertinent family history.  History  Substance Use Topics  . Smoking status: Never Smoker   . Smokeless tobacco: Not on file  . Alcohol Use: Yes      Review of Systems  Constitutional: Negative for fever.  Respiratory: Negative for cough, shortness of breath and wheezing.   Cardiovascular: Positive for chest pain. Negative for palpitations  and syncope.  Gastrointestinal: Negative for nausea, vomiting and abdominal pain.  Neurological: Negative for dizziness.  All other systems reviewed and are negative.    Allergies  Other; Shellfish-derived products; and Adhesive  Home Medications   Current Outpatient Rx  Name Route Sig Dispense Refill  . ASPIRIN EC 81 MG PO TBEC Oral Take 81 mg by mouth daily.    Marland Kitchen BRIMONIDINE TARTRATE 0.2 % OP SOLN Left Eye Place 1 drop into the left eye 2 (two) times daily.     Marland Kitchen METOPROLOL SUCCINATE ER 25 MG PO TB24 Oral Take 12.5 mg by mouth daily.    Marland Kitchen TIMOLOL MALEATE 0.5 % OP SOLN Left Eye Place 1 drop into the left eye 2 (two) times daily. Left eye    . TOBRAMYCIN-DEXAMETHASONE 0.3-0.05 % OP SUSP Both Eyes Place 1 drop into both eyes 4 (four) times daily.      BP 138/78  Pulse 65  Temp(Src) 98.1 F (36.7 C) (Oral)  Resp 16  Ht 6' (1.829 m)  Wt 167 lb (75.751 kg)  BMI 22.65 kg/m2  SpO2 100%  Physical Exam  Nursing note and vitals reviewed. Constitutional: He is oriented to person, place, and time. He appears well-developed and well-nourished. No distress.  HENT:  Head: Normocephalic and atraumatic.  Mouth/Throat: Oropharynx is clear and moist.  Eyes: Conjunctivae and EOM are normal. Pupils are equal, round, and reactive to light.  Neck: Normal range of motion. Neck supple.  Cardiovascular: Normal rate, regular rhythm and intact distal pulses.  No murmur heard. Pulmonary/Chest: Effort normal and breath sounds normal. No respiratory distress. He has no wheezes. He has no rales. He exhibits no tenderness.  Abdominal: Soft. He exhibits no distension. There is no tenderness. There is no rebound and no guarding.  Musculoskeletal: Normal range of motion. He exhibits no edema and no tenderness.  Neurological: He is alert and oriented to person, place, and time.  Skin: Skin is warm and dry. No rash noted. No erythema.  Psychiatric: He has a normal mood and affect. His behavior is normal.      ED Course  Procedures (including critical care time)  Labs Reviewed  CBC - Abnormal; Notable for the following:    WBC 25.1 (*)    Platelets 100 (*) PLATELET COUNT CONFIRMED BY SMEAR   All other components within normal limits  BASIC METABOLIC PANEL - Abnormal; Notable for the following:    Glucose, Bld 144 (*)    GFR calc non Af Amer 85 (*)    All other components within normal limits  POCT I-STAT TROPONIN I   Dg Chest 2 View  11/26/2011  *RADIOLOGY REPORT*  Clinical Data: Chest pain, weakness and shortness of breath.  CHEST - 2 VIEW  Comparison: Chest x-ray 09/09/2011.  Findings: Lung volumes are normal.  No consolidative airspace disease.  No pleural effusions.  No pneumothorax.  No pulmonary nodule or mass noted.  Pulmonary vasculature and the cardiomediastinal silhouette are within normal limits.  IMPRESSION: 1. No radiographic evidence of acute cardiopulmonary disease.  Original Report Authenticated By: Florencia Reasons, M.D.   Ct Angio Chest W/cm &/or Wo Cm  11/26/2011  *RADIOLOGY REPORT*  Clinical Data: Chest pain.  Evaluate for pulmonary embolism.  CT ANGIOGRAPHY CHEST  Technique:  Multidetector CT imaging of the chest using the standard protocol during bolus administration of intravenous contrast. Multiplanar reconstructed images including MIPs were obtained and reviewed to evaluate the vascular anatomy.  Contrast: OMNIPAQUE IOHEXOL 350 MG/ML SOLN  Comparison: No priors.  Findings:  Mediastinum: There are no filling defects within the pulmonary arterial tree to suggest underlying pulmonary embolism. Heart size is normal. There is no significant pericardial fluid, thickening or pericardial calcification. There is atherosclerosis of the thoracic aorta, the great vessels of the mediastinum and the coronary arteries, including calcified atherosclerotic plaque in the left main and left circumflex coronary arteries. No pathologically enlarged mediastinal or hilar lymph nodes.  Esophagus is unremarkable in appearance.  Lungs/Pleura: There are dependent linear opacities throughout the lower lobes of the lungs bilaterally, and in the inferior segment of the lingula, compatible with areas of subsegmental atelectasis and/or scarring.  No definite focal airspace consolidation.  No pleural effusions.  Upper Abdomen: Unremarkable.  Musculoskeletal: There are no aggressive appearing lytic or blastic lesions noted in the visualized portions of the skeleton.  IMPRESSION: 1.  No evidence of pulmonary embolism. 2.  Extensive linear opacities throughout the lower lobes of the lungs bilaterally, and the inferior segment of the lingula, most compatible with areas of subsegmental atelectasis and/or scarring. 3. Atherosclerosis, including left main and left circumflex coronary artery disease. Please note that although the presence of coronary artery calcium documents the presence of coronary artery disease, the severity of this disease and any potential stenosis cannot be assessed on this non-gated CT examination.  Assessment for potential risk factor modification, dietary therapy or pharmacologic therapy may be warranted, if clinically indicated.  Original Report Authenticated By: Florencia Reasons, M.D.     Date: 11/26/2011  Rate: 68  Rhythm: normal sinus rhythm  QRS Axis: normal  Intervals: normal  ST/T Wave abnormalities: normal  Conduction Disutrbances:none  Narrative Interpretation:   Old EKG Reviewed: unchanged   1. Atypical chest pain       MDM   Patient with exam concerning for possible PE. He recently traveled back from [place] 3 weeks ago it also has a history of CLL. Today he has had intermittent bouts of left-sided chest pain that lasts 1-2 seconds and then goes away.  Patient is well-appearing on exam and has normal vital signs. He also states that his sister recently had blood clots and was found to have a genetic marker that made her an increased risk. Also patient  recently had A. fib in January and had a heart catheterization that was in within normal limits. Patient's symptoms do not seem to be cardiac his EKG, troponin and lab tests were within normal limits.  Chest x-ray and CT PE are pending.  Chest x-ray within normal limits. CT negative for PE. 2 months ago patient had a catheterization which showed completely normal coronary arteries. Unclear what is causing this atypical left chest pain today.   CTPA negative. Repeat enzymes within normal limits. Unclear what is causing his chest pain however did not feel it is cardiac or respiratory origin. We'll have him followup with his PCP     Gwyneth Sprout, MD 11/26/11 1610  Gwyneth Sprout, MD 11/26/11 [id number]  Gwyneth Sprout, MD 11/26/11 2240  Gwyneth Sprout, MD 11/26/11 2243

## 2011-11-26 NOTE — ED Notes (Signed)
Patient took an adult aspirin prior to arrival.

## 2011-11-26 NOTE — ED Notes (Signed)
C/o sharp left sided cp with assoc left arm numbness

## 2011-11-26 NOTE — Progress Notes (Signed)
This office note has been dictated.

## 2011-11-27 NOTE — Progress Notes (Signed)
CC:   Geoffry Paradise, MD Nanetta Batty, M.D. Casper Harrison, MD Rodrigo Ran, OD  DIAGNOSIS: 1. Stage B CLL. 2. Chorioretinitis of the right eye.  CURRENT THERAPY:  Observation.  INTERVAL HISTORY:  Lucas [name] comes in for followup.  He had a great time in [place].  He went to [place] for about 2 weeks.  This was in late February.  He got all of his requisite vaccinations.  He had a wonderful time over there.  He had no problems outside of some diarrhea.  There was no bleeding with the diarrhea.  He did have a flare-up of his chorioretinitis before he left.  This, however, was not a problem while he was over in [place].  He has had no fever.  He has had no cough.  He has had no change in bowel or bladder habits outside of the diarrhea that he had after he came back.  He has not noted any rashes.  He has had no headache.  There has been no dysphasia or odynophagia.  He says that he has really cut back his wine consumption.  He says that once he started doing this, a lot of his symptoms seem to improve.  PHYSICAL EXAMINATION:  This is a well-developed, well-nourished white gentleman in no obvious distress.  Vital signs:  97.7, pulse 77, respiratory rate 22, blood pressure 112/76.  Weight is 167.  Head and neck exam shows a normocephalic, atraumatic skull.  There are no ocular or oral lesions.  There are no palpable cervical or supraclavicular lymph nodes.  Lungs:  Clear to percussion and auscultation bilaterally. Cardiac:  Regular rate and rhythm with normal S1, S2.  There are no murmurs, rubs or bruits.  Abdomen:  Soft with good bowel sounds.  There is no palpable abdominal mass.  There is no palpable hepatosplenomegaly. Back: No tenderness over the spine, ribs, or hips.  Extremities:  No clubbing, cyanosis or edema.  Axillary exam shows no bilateral axillary adenopathy.  Neurologic:  No focal neurological deficits.  LABORATORY STUDIES:  White cell count is 23.4,  hemoglobin 14.2, hematocrit 43.7, platelet count 99,000.  LDH is a 89.  IMPRESSION:  Lucas [name] is a 71 year old gentleman.  He has stage B CLL.  We have been following him now probably for, I think, 10 years. His white cell count tends to fluctuate.  I did forget to mention that in late January he was seen by Dr. Nanetta Batty of Cardiology.  Dr. Allyson Sabal did a cardiac cath because of the question of paroxysmal atrial fibrillation.  He also was having, I think, some chest discomfort.  He had a stress test, which seemed to show some ischemia.  However, his cardiac cath showed excellent coronary artery anatomy without any significant blockages.  I am glad Dr. Allyson Sabal did this procedure before Lucas [name] went over to [place] because it would have been a "mess" if Lucas [name] would have had issues with his heart while in [place].  Again, his white cell count tends to fluctuate.  His platelet count is on the lower side but yet stable.  I just do not see any evidence of progression for CLL.  We will continue to follow him along.  I will plan to get him back to see me in another 3-4 months.    ______________________________ Josph Macho, M.D. PRE/MEDQ  D:  11/26/2011  T:  11/27/2011  Job:  [id number]

## 2011-12-01 DIAGNOSIS — H1030 Unspecified acute conjunctivitis, unspecified eye: Secondary | ICD-10-CM | POA: Diagnosis not present

## 2011-12-01 LAB — PROTEIN ELECTROPHORESIS, SERUM, WITH REFLEX
Albumin ELP: 62.6 % (ref 55.8–66.1)
Beta Globulin: 4.9 % (ref 4.7–7.2)
Total Protein, Serum Electrophoresis: 6.1 g/dL (ref 6.0–8.3)

## 2011-12-01 LAB — IGG, IGA, IGM
IgG (Immunoglobin G), Serum: 1070 mg/dL (ref 650–1600)
IgM, Serum: 82 mg/dL (ref 41–251)

## 2011-12-01 LAB — PROTHROMBIN GENE MUTATION

## 2011-12-01 LAB — MTHFR DNA ANALYSIS

## 2011-12-04 ENCOUNTER — Telehealth: Payer: Self-pay | Admitting: *Deleted

## 2011-12-04 DIAGNOSIS — I4891 Unspecified atrial fibrillation: Secondary | ICD-10-CM | POA: Diagnosis not present

## 2011-12-04 NOTE — Telephone Encounter (Addendum)
Message copied by Mirian Capuchin on Fri Dec 04, 2011  9:05 AM ------      Message from: Arlan Organ R      Created: Thu Dec 03, 2011  6:24 PM       Call- all clotting studies are normal!!  There is no hereditary disorder that he has.  Cindee Lame This message given to pt.  Voiced understanding.

## 2011-12-23 DIAGNOSIS — H4040X0 Glaucoma secondary to eye inflammation, unspecified eye, stage unspecified: Secondary | ICD-10-CM | POA: Diagnosis not present

## 2011-12-23 DIAGNOSIS — H209 Unspecified iridocyclitis: Secondary | ICD-10-CM | POA: Diagnosis not present

## 2011-12-23 DIAGNOSIS — H409 Unspecified glaucoma: Secondary | ICD-10-CM | POA: Diagnosis not present

## 2011-12-30 DIAGNOSIS — L570 Actinic keratosis: Secondary | ICD-10-CM | POA: Diagnosis not present

## 2012-01-14 NOTE — Telephone Encounter (Signed)
Opened in error

## 2012-02-11 DIAGNOSIS — H2010 Chronic iridocyclitis, unspecified eye: Secondary | ICD-10-CM | POA: Diagnosis not present

## 2012-02-23 DIAGNOSIS — Z125 Encounter for screening for malignant neoplasm of prostate: Secondary | ICD-10-CM | POA: Diagnosis not present

## 2012-02-23 DIAGNOSIS — E119 Type 2 diabetes mellitus without complications: Secondary | ICD-10-CM | POA: Diagnosis not present

## 2012-02-23 DIAGNOSIS — E785 Hyperlipidemia, unspecified: Secondary | ICD-10-CM | POA: Diagnosis not present

## 2012-02-24 DIAGNOSIS — H20029 Recurrent acute iridocyclitis, unspecified eye: Secondary | ICD-10-CM | POA: Diagnosis not present

## 2012-02-24 DIAGNOSIS — H35379 Puckering of macula, unspecified eye: Secondary | ICD-10-CM | POA: Diagnosis not present

## 2012-02-24 DIAGNOSIS — H3090 Unspecified chorioretinal inflammation, unspecified eye: Secondary | ICD-10-CM | POA: Diagnosis not present

## 2012-03-02 DIAGNOSIS — Z23 Encounter for immunization: Secondary | ICD-10-CM | POA: Diagnosis not present

## 2012-03-02 DIAGNOSIS — E119 Type 2 diabetes mellitus without complications: Secondary | ICD-10-CM | POA: Diagnosis not present

## 2012-03-02 DIAGNOSIS — E785 Hyperlipidemia, unspecified: Secondary | ICD-10-CM | POA: Diagnosis not present

## 2012-03-02 DIAGNOSIS — N401 Enlarged prostate with lower urinary tract symptoms: Secondary | ICD-10-CM | POA: Diagnosis not present

## 2012-03-02 DIAGNOSIS — Z0000 Encounter for general adult medical examination without abnormal findings: Secondary | ICD-10-CM | POA: Diagnosis not present

## 2012-03-03 DIAGNOSIS — Z1212 Encounter for screening for malignant neoplasm of rectum: Secondary | ICD-10-CM | POA: Diagnosis not present

## 2012-03-23 ENCOUNTER — Other Ambulatory Visit: Payer: Medicare Other | Admitting: Lab

## 2012-03-23 ENCOUNTER — Ambulatory Visit: Payer: Medicare Other | Admitting: Hematology & Oncology

## 2012-03-31 ENCOUNTER — Other Ambulatory Visit: Payer: Medicare Other | Admitting: Lab

## 2012-03-31 ENCOUNTER — Ambulatory Visit: Payer: Medicare Other | Admitting: Hematology & Oncology

## 2012-04-06 ENCOUNTER — Other Ambulatory Visit (HOSPITAL_BASED_OUTPATIENT_CLINIC_OR_DEPARTMENT_OTHER): Payer: Medicare Other | Admitting: Lab

## 2012-04-06 ENCOUNTER — Ambulatory Visit (HOSPITAL_BASED_OUTPATIENT_CLINIC_OR_DEPARTMENT_OTHER): Payer: Medicare Other | Admitting: Hematology & Oncology

## 2012-04-06 VITALS — BP 109/67 | HR 67 | Temp 97.6°F | Resp 20 | Ht 72.0 in | Wt 165.0 lb

## 2012-04-06 DIAGNOSIS — C9110 Chronic lymphocytic leukemia of B-cell type not having achieved remission: Secondary | ICD-10-CM

## 2012-04-06 LAB — CBC WITH DIFFERENTIAL (CANCER CENTER ONLY)
MCH: 31.7 pg (ref 28.0–33.4)
Platelets: 105 10*3/uL — ABNORMAL LOW (ref 145–400)
RBC: 4.73 10*6/uL (ref 4.20–5.70)
RDW: 14.6 % (ref 11.1–15.7)

## 2012-04-06 LAB — MANUAL DIFFERENTIAL (CHCC SATELLITE)
PLT EST ~~LOC~~: DECREASED
Platelet Morphology: NORMAL
SEG: 11 % — ABNORMAL LOW (ref 40–75)

## 2012-04-06 LAB — CHCC SATELLITE - SMEAR

## 2012-04-06 NOTE — Progress Notes (Signed)
This office note has been dictated.

## 2012-04-07 NOTE — Progress Notes (Signed)
CC:   Geoffry Paradise, M.D. Nanetta Batty, M.D. Casper Harrison, MD Rodrigo Ran, OD  DIAGNOSIS: 1. Stage B CLL. 2. Recurrent chorioretinitis of the right eye.  CURRENT THERAPY:  Observation.  INTERIM HISTORY:  Mr. Lomeli comes in for followup.  We saw 4 months ago.  He has been doing real well.  He and his wife are getting ready go to the [facility] in September.  They will be gone for 2 weeks.  He has been volunteering locally.  He was at the local golf tournament last week.  He had a great time.  He has not noted any problems with fatigue or weakness.  His chorioretinitis has not been too bad for him.  He said usually has a recurrence every 3 months or so.  He takes steroid drops for this when there is a flare-up.  He has had no headache.  He has had no cough or shortness of breath.  He has had no nausea or vomiting.  There has been no change in bowel or bladder habits.  He has had no rashes.  PHYSICAL EXAMINATION:  This is a well-developed, well-nourished white gentleman in no obvious distress.  Vital signs:  97.6, pulse 67, respiratory rate 20, blood pressure 109/67.  Weight is 165.  Head and neck:  Normocephalic, atraumatic skull.  There are no ocular or oral lesions.  There are no palpable cervical or supraclavicular lymph nodes. I do not see any inflammation of his right eye.  Lungs:  Clear bilaterally.  Cardiac:  Regular rate and rhythm with a normal S1 and S2. There are no murmurs, rubs or bruits.  Abdomen:  Soft with good bowel sounds.  There is no palpable abdominal mass.  There is no palpable hepatosplenomegaly.  Back:  No tenderness over the spine, ribs, or hips. lymph nodes:  Maybe a faint lymph node in the left cervical chain. There is no discrete axillary lymphadenopathy bilaterally.  There is no inguinal lymphadenopathy.  Extremities:  No clubbing, cyanosis or edema.  LABORATORY STUDIES:  White cell count is 22.6, hemoglobin 15, hematocrit 46, platelet  count 105.  IMPRESSION:  Mr. Delbuono is a 71 year old gentleman with stage B CLL. We have been following him now probably for a good 10 or 11 years.  He has had no problems with the CLL.  So far, he has not had any further problems with his heart.  He did have a cardiac catheterization back I think in the wintertime.  We will go ahead and get him back in 6 months now.  I do not see that he would need to be seen before then or have any blood work done beforehand.    ______________________________ Josph Macho, M.D. PRE/MEDQ  D:  04/06/2012  T:  04/07/2012  Job:  [id number]

## 2012-04-22 DIAGNOSIS — I4891 Unspecified atrial fibrillation: Secondary | ICD-10-CM | POA: Diagnosis not present

## 2012-05-17 DIAGNOSIS — Z23 Encounter for immunization: Secondary | ICD-10-CM | POA: Diagnosis not present

## 2012-05-19 ENCOUNTER — Other Ambulatory Visit: Payer: Self-pay | Admitting: Dermatology

## 2012-05-19 DIAGNOSIS — B353 Tinea pedis: Secondary | ICD-10-CM | POA: Diagnosis not present

## 2012-05-19 DIAGNOSIS — D485 Neoplasm of uncertain behavior of skin: Secondary | ICD-10-CM | POA: Diagnosis not present

## 2012-05-19 DIAGNOSIS — L570 Actinic keratosis: Secondary | ICD-10-CM | POA: Diagnosis not present

## 2012-05-19 DIAGNOSIS — L821 Other seborrheic keratosis: Secondary | ICD-10-CM | POA: Diagnosis not present

## 2012-05-19 DIAGNOSIS — D0439 Carcinoma in situ of skin of other parts of face: Secondary | ICD-10-CM | POA: Diagnosis not present

## 2012-05-19 DIAGNOSIS — Z85828 Personal history of other malignant neoplasm of skin: Secondary | ICD-10-CM | POA: Diagnosis not present

## 2012-06-07 DIAGNOSIS — D0439 Carcinoma in situ of skin of other parts of face: Secondary | ICD-10-CM | POA: Diagnosis not present

## 2012-06-22 DIAGNOSIS — H40049 Steroid responder, unspecified eye: Secondary | ICD-10-CM | POA: Diagnosis not present

## 2012-07-21 IMAGING — CR DG CHEST 2V
2 series · 2 of 2 positions shown · non-contrast
Comparison: None.

CLINICAL DATA: Dizziness and weakness; hypotension.

CHEST - 2 VIEW

[w chest pa]
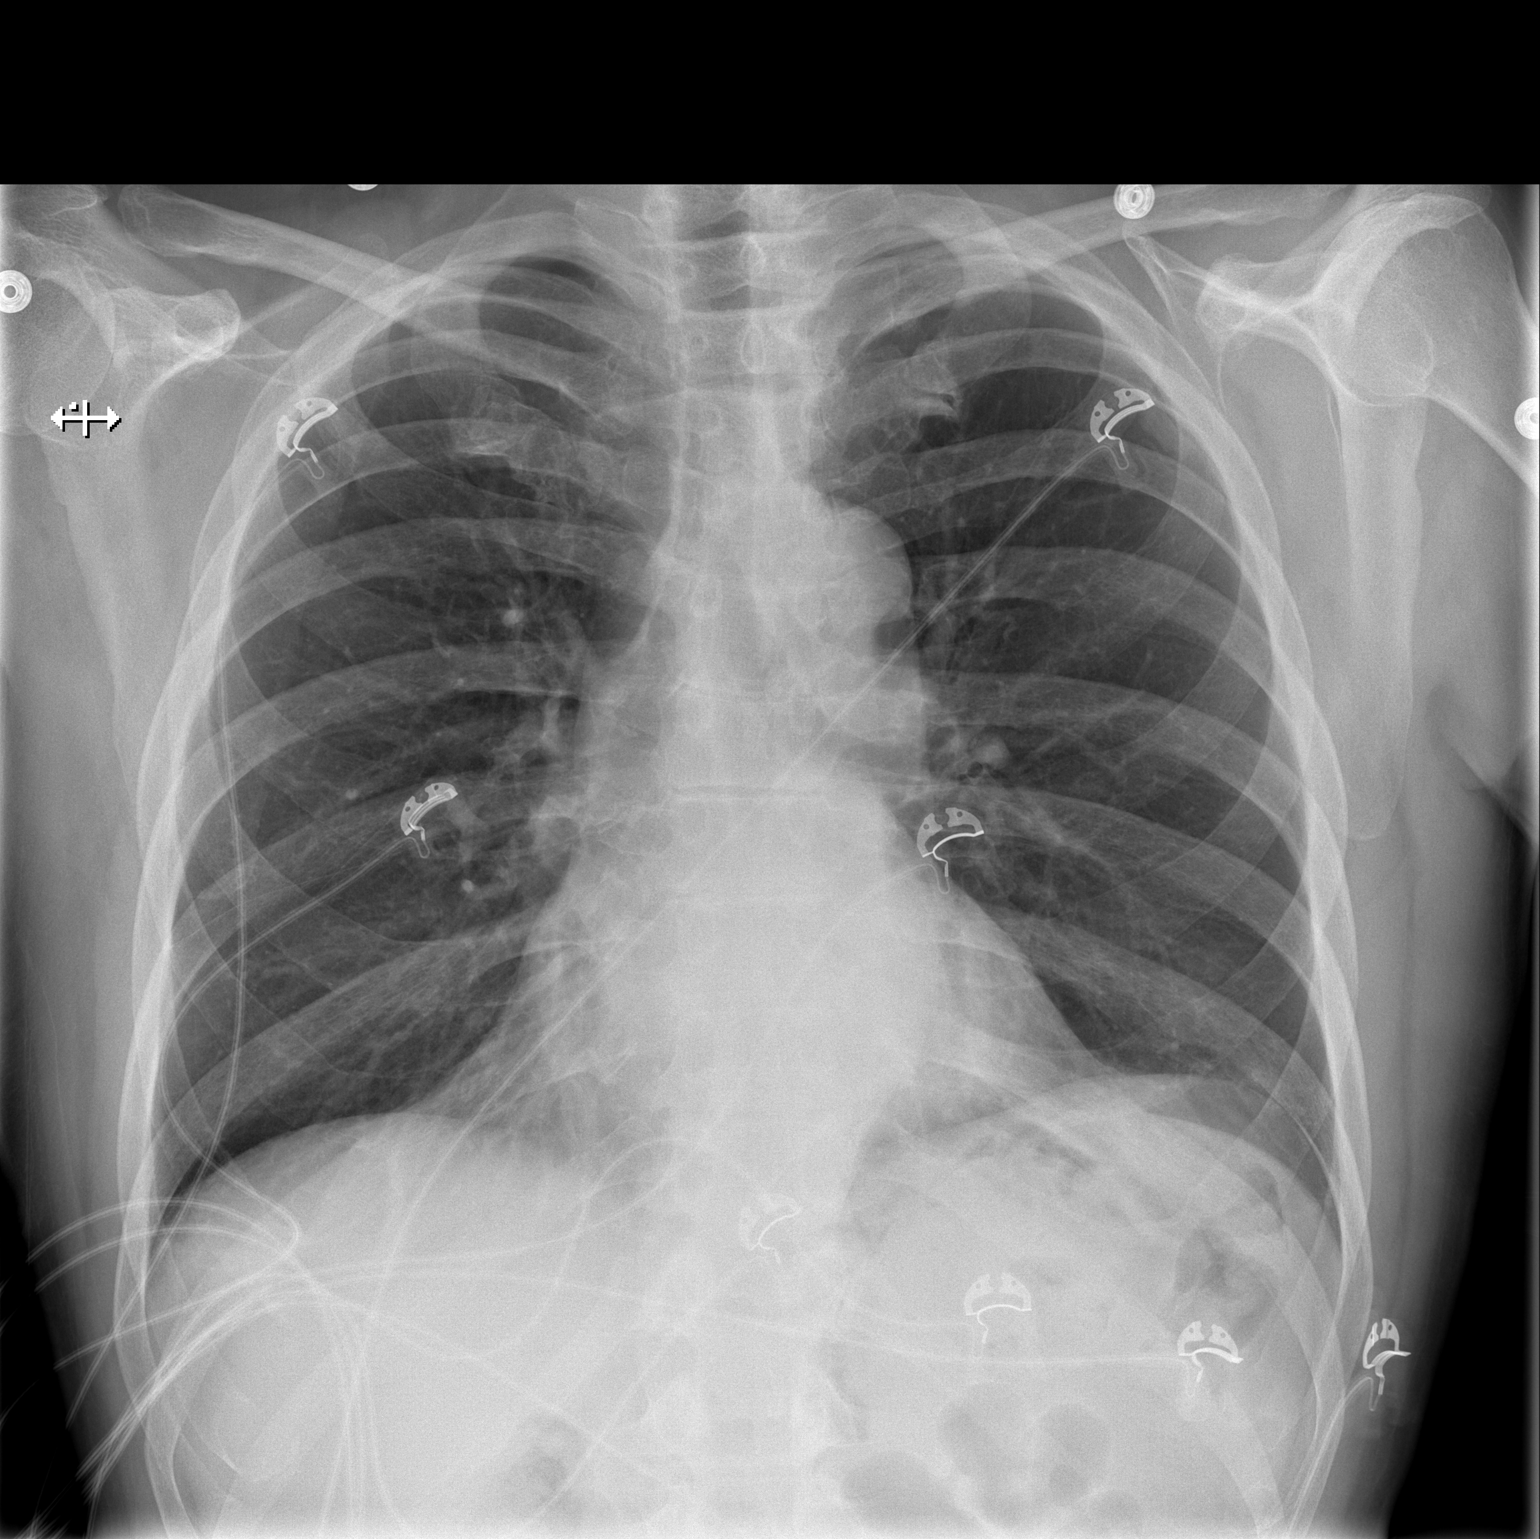

[w chest lat]
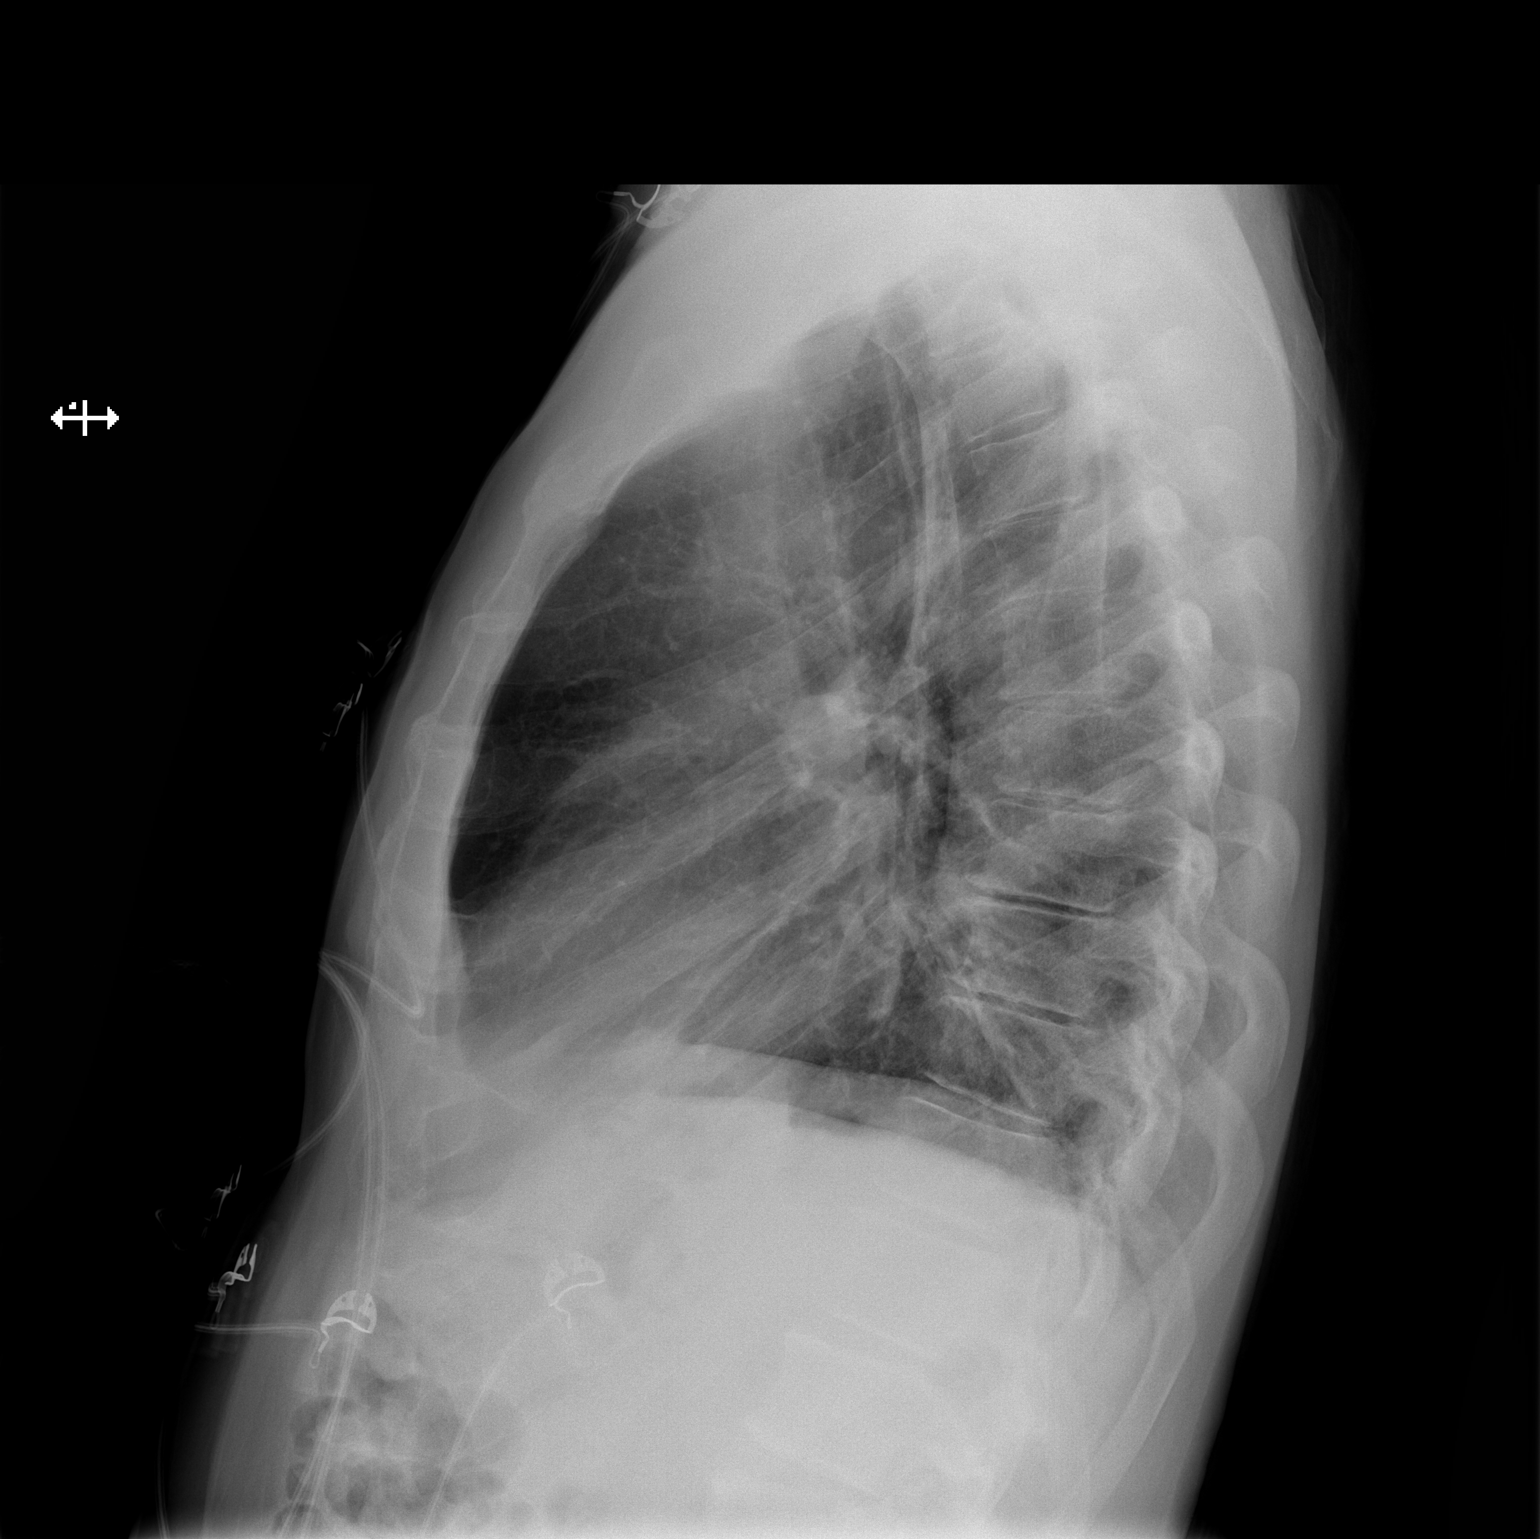

[2 of 2 positions shown; findings below may reference images not displayed]

FINDINGS: The lungs are well-aerated and clear.  There is no
evidence of focal opacification, pleural effusion or pneumothorax.

The heart is normal in size; the mediastinal contour is within
normal limits.  No acute osseous abnormalities are seen.
IMPRESSION: No acute cardiopulmonary process seen.

## 2012-08-23 DIAGNOSIS — R079 Chest pain, unspecified: Secondary | ICD-10-CM | POA: Diagnosis not present

## 2012-08-31 DIAGNOSIS — L570 Actinic keratosis: Secondary | ICD-10-CM | POA: Diagnosis not present

## 2012-08-31 DIAGNOSIS — D485 Neoplasm of uncertain behavior of skin: Secondary | ICD-10-CM | POA: Diagnosis not present

## 2012-08-31 DIAGNOSIS — L919 Hypertrophic disorder of the skin, unspecified: Secondary | ICD-10-CM | POA: Diagnosis not present

## 2012-08-31 DIAGNOSIS — L821 Other seborrheic keratosis: Secondary | ICD-10-CM | POA: Diagnosis not present

## 2012-08-31 DIAGNOSIS — D0470 Carcinoma in situ of skin of unspecified lower limb, including hip: Secondary | ICD-10-CM | POA: Diagnosis not present

## 2012-08-31 DIAGNOSIS — L909 Atrophic disorder of skin, unspecified: Secondary | ICD-10-CM | POA: Diagnosis not present

## 2012-08-31 DIAGNOSIS — B079 Viral wart, unspecified: Secondary | ICD-10-CM | POA: Diagnosis not present

## 2012-09-15 DIAGNOSIS — D0470 Carcinoma in situ of skin of unspecified lower limb, including hip: Secondary | ICD-10-CM | POA: Diagnosis not present

## 2012-09-15 DIAGNOSIS — E785 Hyperlipidemia, unspecified: Secondary | ICD-10-CM | POA: Diagnosis not present

## 2012-09-15 DIAGNOSIS — C9110 Chronic lymphocytic leukemia of B-cell type not having achieved remission: Secondary | ICD-10-CM | POA: Diagnosis not present

## 2012-09-15 DIAGNOSIS — E119 Type 2 diabetes mellitus without complications: Secondary | ICD-10-CM | POA: Diagnosis not present

## 2012-09-19 ENCOUNTER — Ambulatory Visit (HOSPITAL_BASED_OUTPATIENT_CLINIC_OR_DEPARTMENT_OTHER): Payer: Medicare Other | Admitting: Hematology & Oncology

## 2012-09-19 ENCOUNTER — Other Ambulatory Visit (HOSPITAL_BASED_OUTPATIENT_CLINIC_OR_DEPARTMENT_OTHER): Payer: Medicare Other | Admitting: Lab

## 2012-09-19 VITALS — BP 112/71 | HR 63 | Temp 97.5°F | Resp 18 | Ht 72.0 in | Wt 169.0 lb

## 2012-09-19 DIAGNOSIS — C9110 Chronic lymphocytic leukemia of B-cell type not having achieved remission: Secondary | ICD-10-CM

## 2012-09-19 LAB — MANUAL DIFFERENTIAL (CHCC SATELLITE)
ALC: 19.2 10*3/uL — ABNORMAL HIGH (ref 0.9–3.3)
ANC (CHCC HP manual diff): 2.9 10*3/uL (ref 1.5–6.5)
PLT EST ~~LOC~~: DECREASED

## 2012-09-19 LAB — CBC WITH DIFFERENTIAL (CANCER CENTER ONLY)
HCT: 44.5 % (ref 38.7–49.9)
MCHC: 31.7 g/dL — ABNORMAL LOW (ref 32.0–35.9)
Platelets: 99 10*3/uL — ABNORMAL LOW (ref 145–400)
RDW: 14.7 % (ref 11.1–15.7)
WBC: 22.6 10*3/uL — ABNORMAL HIGH (ref 4.0–10.0)

## 2012-09-19 LAB — CHCC SATELLITE - SMEAR

## 2012-09-19 NOTE — Progress Notes (Signed)
This office note has been dictated.

## 2012-09-20 NOTE — Progress Notes (Signed)
CC:   Geoffry Paradise, M.D. Nanetta Batty, M.D. Casper Harrison, MD Rodrigo Ran, OD  DIAGNOSES: 1. Chronic lymphocytic leukemia-stage B. 2. Chorioretinitis of the right eye.  CURRENT THERAPY:  Observation.  INTERIM HISTORY:  Lucas [name] comes in for his followup.  He has been doing fairly well.  [place] in [place].  They really enjoyed themselves.  They will be going to [place] probably in August.  He has had no cardiac issues.  His chorioretinitis has not been exacerbated.  He has had no change in medications.  He has had no nausea or vomiting. There has been no change in bowel or bladder habits.  He has had no rashes.  PHYSICAL EXAMINATION:  General:  This is a well-developed, well- nourished white gentleman in no obvious distress.  Vital signs: Temperature of 97.5, pulse 63, respiratory rate 18, blood pressure 112/71.  Weight is 169.  Head and neck:  Normocephalic, atraumatic skull.  There are no ocular or oral lesions.  There are no palpable cervical or supraclavicular lymph nodes.  Pupils react appropriately. The left supraclavicular lymph node is maybe about 1 cm.  It is soft and mobile and nontender.  No other adenopathies noted in the neck.  Lungs: Clear bilaterally.  Cardiac:  Regular rate and rhythm with a normal S1 and S2.  There are no murmurs, rubs, or bruits.  Abdomen:  Soft with good bowel sounds.  There is no palpable abdominal mass.  There is no fluid wave.  There is no palpable hepatosplenomegaly.  Axillary:  No bilateral axillary adenopathy.  Inguinal:  No inguinal adenopathy bilaterally.  Extremities:  No clubbing, cyanosis, or edema.  Skin:  No rashes, ecchymosis, or petechia.  LABORATORY STUDIES:  White cell count is 22.6, hemoglobin 14.1, hematocrit 45, platelet count 99,000.  IMPRESSION:  Lucas [name] is a 72 year old gentleman with stage B chronic lymphocytic leukemia.  He continues to do quite well.  I do not see  any evidence of progressive disease.  We have been seeing him now for about 15 years.  He has really done nicely.  We will plan to get him back in 6 months.  We will him back before he goes on his trip to [place] in August.    ______________________________ Josph Macho, M.D. PRE/MEDQ  D:  09/19/2012  T:  09/20/2012  Job:  [id number]

## 2012-10-07 IMAGING — CT CT ANGIO CHEST
1 of 3 series · 18 of 36 positions shown · IV contrast (APPLIED)
Comparison: No priors.

CLINICAL DATA: Chest pain.  Evaluate for pulmonary embolism.

CT ANGIOGRAPHY CHEST
TECHNIQUE: Multidetector CT imaging of the chest using the
standard protocol during bolus administration of intravenous
contrast. Multiplanar reconstructed images including MIPs were
obtained and reviewed to evaluate the vascular anatomy.
Contrast: 100mL OMNIPAQUE IOHEXOL 350 MG/ML SOLN

[Series 6: pulm embolism 1.0 b25f thin · axial · 0.64mm/px · z∈[+1275,+1507]mm · 18 of 256 slices shown]
[im 12/256  lung]
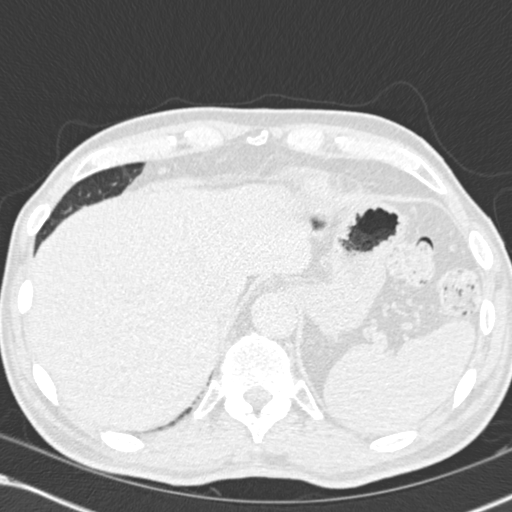
[im 23/256  soft-tissue]
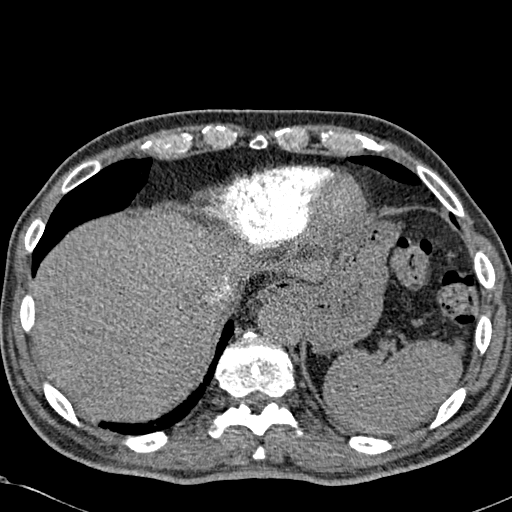
[im 45/256  lung]
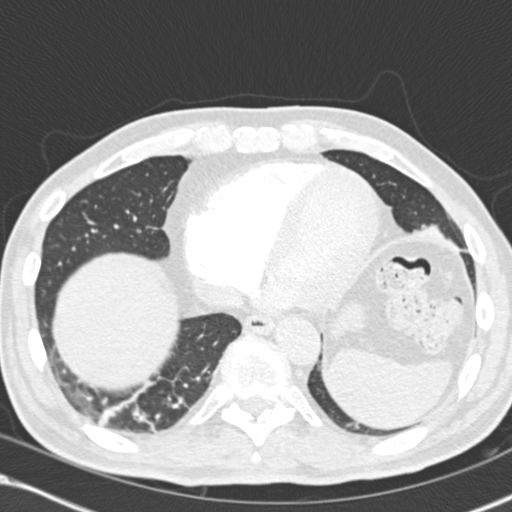
[im 56/256  soft-tissue]
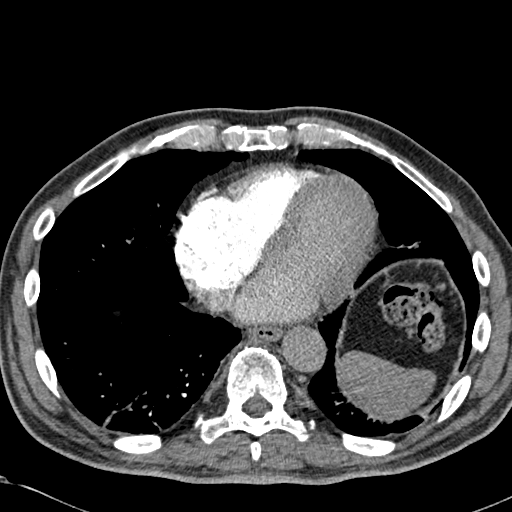
[im 67/256  lung]
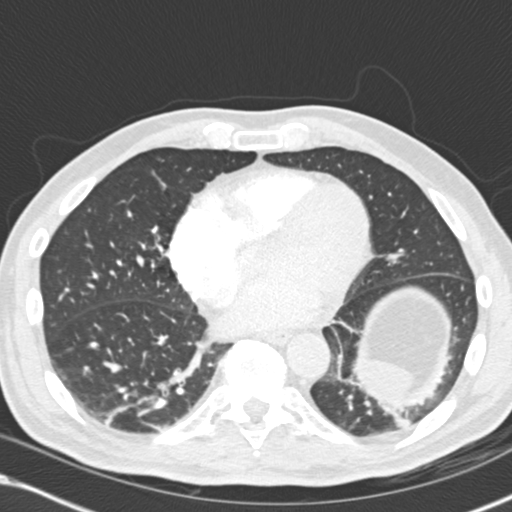
[im 78/256  soft-tissue]
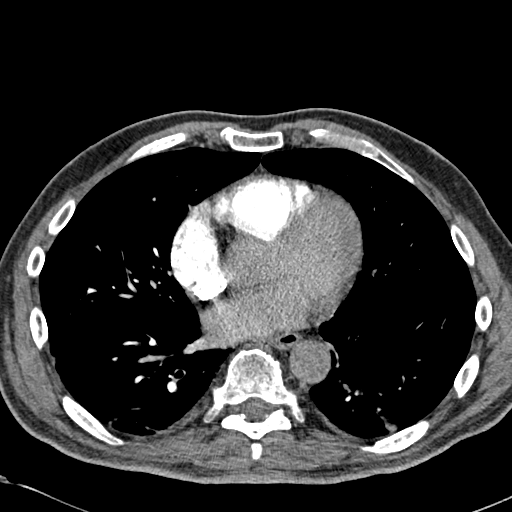
[im 89/256  lung]
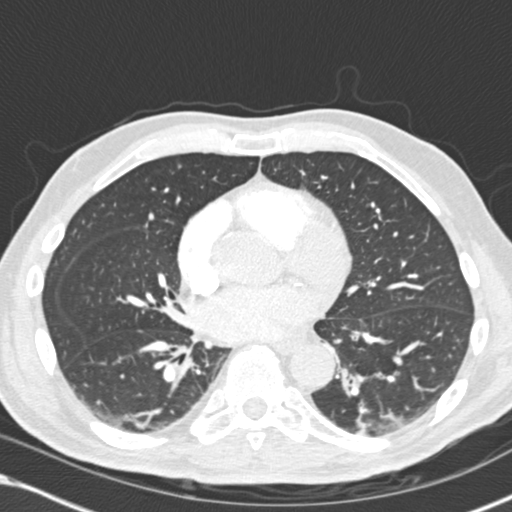
[im 111/256  soft-tissue]
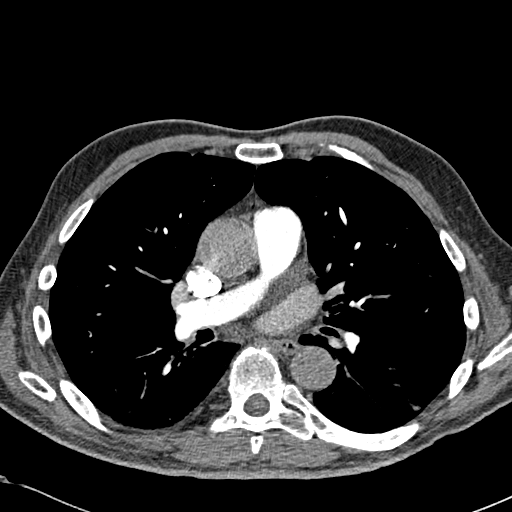
[im 122/256  lung]
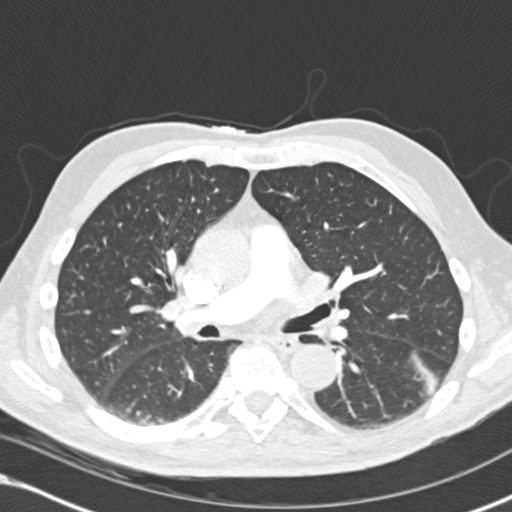
[im 134/256  soft-tissue]
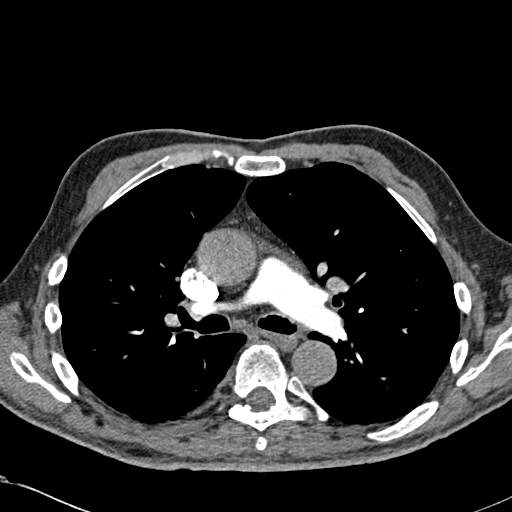
[im 145/256  lung]
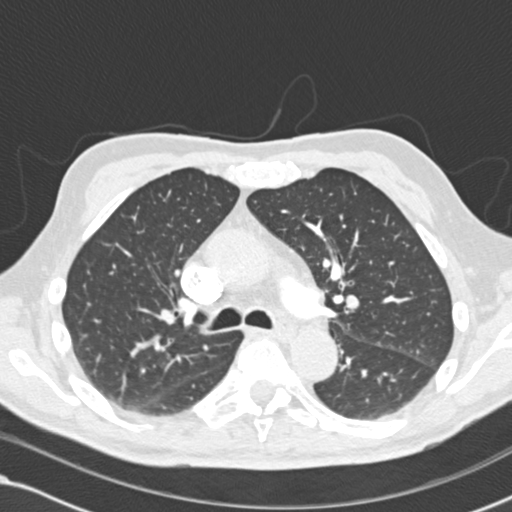
[im 167/256  soft-tissue]
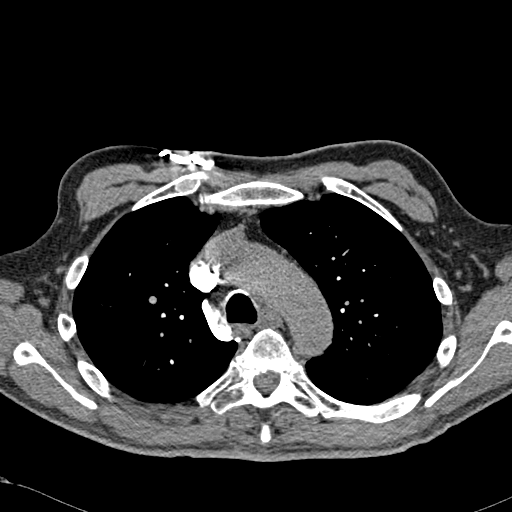
[im 178/256  lung]
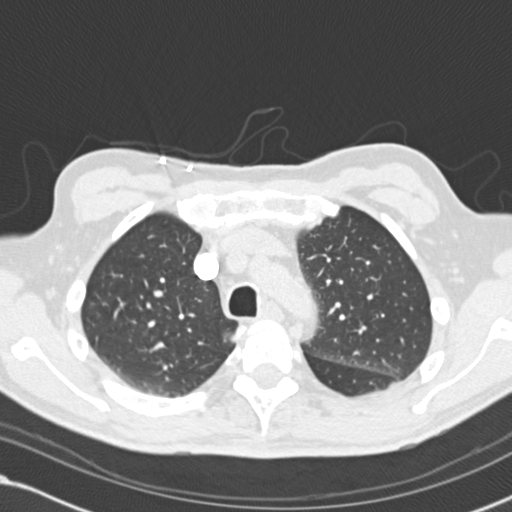
[im 189/256  soft-tissue]
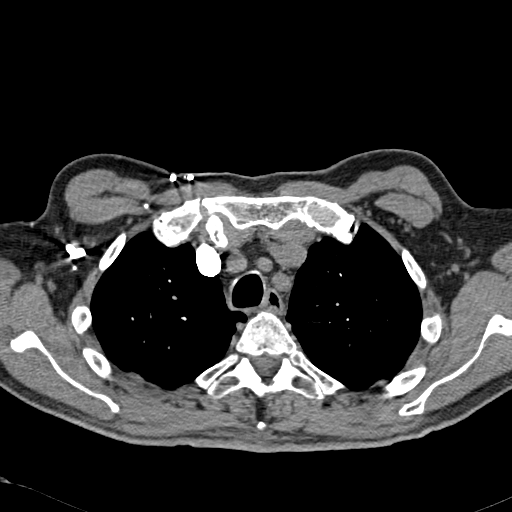
[im 200/256  lung]
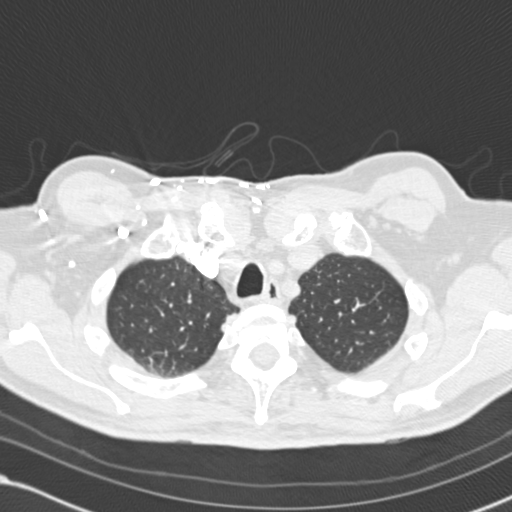
[im 211/256  soft-tissue]
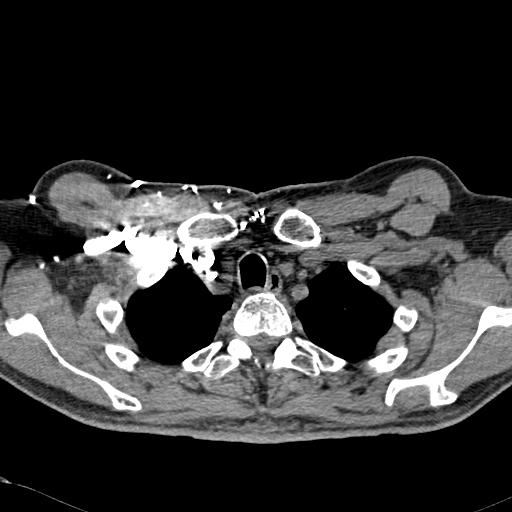
[im 233/256  lung]
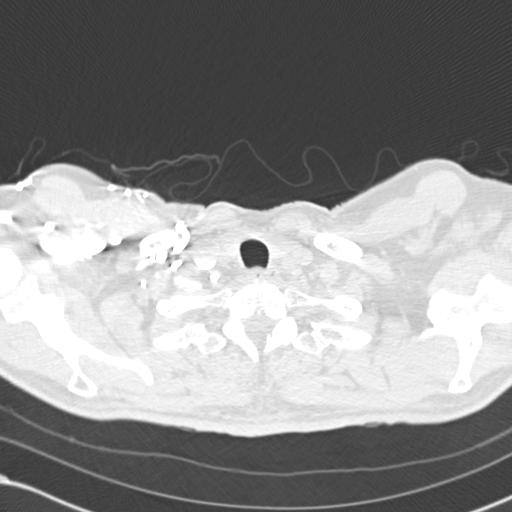
[im 244/256  soft-tissue]
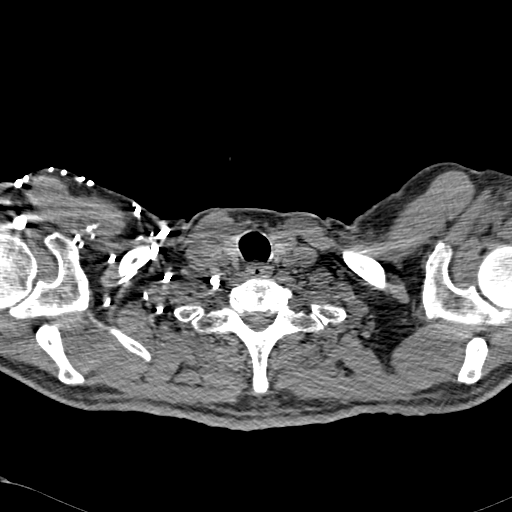

[18 of 36 positions shown; findings below may reference images not displayed]

FINDINGS: Mediastinum: There are no filling defects within the pulmonary
arterial tree to suggest underlying pulmonary embolism. Heart size
is normal. There is no significant pericardial fluid, thickening or
pericardial calcification. There is atherosclerosis of the thoracic
aorta, the great vessels of the mediastinum and the coronary
arteries, including calcified atherosclerotic plaque in the left
main and left circumflex coronary arteries. No pathologically
enlarged mediastinal or hilar lymph nodes. Esophagus is
unremarkable in appearance.

Lungs/Pleura: There are dependent linear opacities throughout the
lower lobes of the lungs bilaterally, and in the inferior segment
of the lingula, compatible with areas of subsegmental atelectasis
and/or scarring.  No definite focal airspace consolidation.  No
pleural effusions.

Upper Abdomen: Unremarkable.

Musculoskeletal: There are no aggressive appearing lytic or blastic
lesions noted in the visualized portions of the skeleton.
IMPRESSION: 1.  No evidence of pulmonary embolism.
2.  Extensive linear opacities throughout the lower lobes of the
lungs bilaterally, and the inferior segment of the lingula, most
compatible with areas of subsegmental atelectasis and/or scarring.
3. Atherosclerosis, including left main and left circumflex
coronary artery disease. Please note that although the presence of
coronary artery calcium documents the presence of coronary artery
disease, the severity of this disease and any potential stenosis
cannot be assessed on this non-gated CT examination.  Assessment
for potential risk factor modification, dietary therapy or
pharmacologic therapy may be warranted, if clinically indicated.

## 2012-10-18 ENCOUNTER — Encounter: Payer: Self-pay | Admitting: Gastroenterology

## 2012-12-13 ENCOUNTER — Other Ambulatory Visit: Payer: Self-pay | Admitting: Dermatology

## 2012-12-13 DIAGNOSIS — L821 Other seborrheic keratosis: Secondary | ICD-10-CM | POA: Diagnosis not present

## 2012-12-13 DIAGNOSIS — L820 Inflamed seborrheic keratosis: Secondary | ICD-10-CM | POA: Diagnosis not present

## 2012-12-13 DIAGNOSIS — D485 Neoplasm of uncertain behavior of skin: Secondary | ICD-10-CM | POA: Diagnosis not present

## 2012-12-13 DIAGNOSIS — D0460 Carcinoma in situ of skin of unspecified upper limb, including shoulder: Secondary | ICD-10-CM | POA: Diagnosis not present

## 2012-12-13 DIAGNOSIS — Z85828 Personal history of other malignant neoplasm of skin: Secondary | ICD-10-CM | POA: Diagnosis not present

## 2012-12-13 DIAGNOSIS — L570 Actinic keratosis: Secondary | ICD-10-CM | POA: Diagnosis not present

## 2013-01-11 DIAGNOSIS — H3090 Unspecified chorioretinal inflammation, unspecified eye: Secondary | ICD-10-CM | POA: Diagnosis not present

## 2013-01-11 DIAGNOSIS — H40049 Steroid responder, unspecified eye: Secondary | ICD-10-CM | POA: Diagnosis not present

## 2013-01-11 DIAGNOSIS — H35379 Puckering of macula, unspecified eye: Secondary | ICD-10-CM | POA: Diagnosis not present

## 2013-01-11 DIAGNOSIS — H4011X0 Primary open-angle glaucoma, stage unspecified: Secondary | ICD-10-CM | POA: Diagnosis not present

## 2013-02-07 DIAGNOSIS — H401290 Low-tension glaucoma, unspecified eye, stage unspecified: Secondary | ICD-10-CM | POA: Diagnosis not present

## 2013-02-07 DIAGNOSIS — H2510 Age-related nuclear cataract, unspecified eye: Secondary | ICD-10-CM | POA: Diagnosis not present

## 2013-02-07 DIAGNOSIS — H409 Unspecified glaucoma: Secondary | ICD-10-CM | POA: Diagnosis not present

## 2013-02-07 DIAGNOSIS — H2010 Chronic iridocyclitis, unspecified eye: Secondary | ICD-10-CM | POA: Diagnosis not present

## 2013-03-09 DIAGNOSIS — E785 Hyperlipidemia, unspecified: Secondary | ICD-10-CM | POA: Diagnosis not present

## 2013-03-09 DIAGNOSIS — Z125 Encounter for screening for malignant neoplasm of prostate: Secondary | ICD-10-CM | POA: Diagnosis not present

## 2013-03-09 DIAGNOSIS — E119 Type 2 diabetes mellitus without complications: Secondary | ICD-10-CM | POA: Diagnosis not present

## 2013-03-15 ENCOUNTER — Ambulatory Visit (HOSPITAL_BASED_OUTPATIENT_CLINIC_OR_DEPARTMENT_OTHER): Payer: Medicare Other | Admitting: Hematology & Oncology

## 2013-03-15 ENCOUNTER — Other Ambulatory Visit (HOSPITAL_BASED_OUTPATIENT_CLINIC_OR_DEPARTMENT_OTHER): Payer: Medicare Other | Admitting: Lab

## 2013-03-15 VITALS — BP 107/66 | HR 68 | Temp 97.6°F | Resp 18 | Ht 71.0 in | Wt 164.0 lb

## 2013-03-15 DIAGNOSIS — C9110 Chronic lymphocytic leukemia of B-cell type not having achieved remission: Secondary | ICD-10-CM

## 2013-03-15 LAB — CHCC SATELLITE - SMEAR

## 2013-03-15 LAB — CBC WITH DIFFERENTIAL (CANCER CENTER ONLY)
HCT: 47 % (ref 38.7–49.9)
HGB: 15.2 g/dL (ref 13.0–17.1)
MCHC: 32.3 g/dL (ref 32.0–35.9)
RBC: 4.83 10*6/uL (ref 4.20–5.70)
WBC: 31.2 10*3/uL — ABNORMAL HIGH (ref 4.0–10.0)

## 2013-03-15 LAB — MANUAL DIFFERENTIAL (CHCC SATELLITE)
ALC: 26.8 10*3/uL — ABNORMAL HIGH (ref 0.9–3.3)
ANC (CHCC HP manual diff): 3.7 10*3/uL (ref 1.5–6.5)
LYMPH: 86 % — ABNORMAL HIGH (ref 14–48)
MONO: 2 % (ref 0–13)

## 2013-03-15 NOTE — Progress Notes (Signed)
This office note has been dictated.

## 2013-03-16 DIAGNOSIS — IMO0002 Reserved for concepts with insufficient information to code with codable children: Secondary | ICD-10-CM | POA: Diagnosis not present

## 2013-03-16 DIAGNOSIS — N401 Enlarged prostate with lower urinary tract symptoms: Secondary | ICD-10-CM | POA: Diagnosis not present

## 2013-03-16 DIAGNOSIS — E785 Hyperlipidemia, unspecified: Secondary | ICD-10-CM | POA: Diagnosis not present

## 2013-03-16 DIAGNOSIS — E119 Type 2 diabetes mellitus without complications: Secondary | ICD-10-CM | POA: Diagnosis not present

## 2013-03-16 DIAGNOSIS — Z1331 Encounter for screening for depression: Secondary | ICD-10-CM | POA: Diagnosis not present

## 2013-03-16 DIAGNOSIS — H3090 Unspecified chorioretinal inflammation, unspecified eye: Secondary | ICD-10-CM | POA: Diagnosis not present

## 2013-03-16 DIAGNOSIS — R82998 Other abnormal findings in urine: Secondary | ICD-10-CM | POA: Diagnosis not present

## 2013-03-16 DIAGNOSIS — R7989 Other specified abnormal findings of blood chemistry: Secondary | ICD-10-CM | POA: Diagnosis not present

## 2013-03-16 DIAGNOSIS — C9110 Chronic lymphocytic leukemia of B-cell type not having achieved remission: Secondary | ICD-10-CM | POA: Diagnosis not present

## 2013-03-16 DIAGNOSIS — Z0000 Encounter for general adult medical examination without abnormal findings: Secondary | ICD-10-CM | POA: Diagnosis not present

## 2013-03-16 NOTE — Progress Notes (Signed)
CC:   Geoffry Paradise, M.D. Nanetta Batty, M.D.  DIAGNOSIS: 1. CLL -- stage B. 2. Chorioretinitis of the right eye.  CURRENT THERAPY:  Observation.  INTERIM HISTORY:  [name] comes in for his followup.  We see him every 6 months.  He is feeling well.  He has had no complaints since we last saw him.  There are still some issues with his right eye with chorioretinitis.  He has not noticed any palpable lymph nodes.  He has had no problems with cough or shortness breath.  He has had no fever, sweats or chills.  He has had no rashes.  He and his wife will be going over to [place] for about 5 weeks at the end of August.  PHYSICAL EXAMINATION:  General:  This is a well-developed, well- nourished white gentleman in no obvious distress.  Vital signs: Temperature of 97.6, pulse 68, respiratory rate 18, blood pressure 107/66.  Weight is 164.  Head and neck:  Normocephalic, atraumatic skull.  There are no ocular or oral lesions.  There are no palpable cervical or supraclavicular lymph nodes.  Lungs:  Clear bilaterally. Cardiac:  Regular rate and rhythm with a normal S1 and S2.  There are no murmurs, rubs or bruits.  Abdomen:  Soft.  He has good bowel sounds. There is no fluid wave.  There is no palpable hepatosplenomegaly. Extremities:  Show no clubbing, cyanosis or edema.  Axillary exam: Shows no bilateral axillary adenopathy.  LABORATORY STUDIES:  White cell count is 31.2, hemoglobin 15.2, hematocrit 47, platelet count 93,000.  White cell differential shows 12 segs, 86 lymphocytes, 2 monos.  IMPRESSION:  Mr. Fier is a 72 year old gentleman with chronic lymphocytic leukemia.  We have been following him now probably for over 10 years.  We might be seeing some progressive disease.  We will have to see how his blood counts look when we get the back.  I want him back in about 3 months' time now, not 6.  I want to make sure that we are not seeing a rapid rise in his white cell  count    ______________________________ Josph Macho, M.D. PRE/MEDQ  D:  03/15/2013  T:  03/16/2013  Job:  [id number]

## 2013-03-17 DIAGNOSIS — Z1212 Encounter for screening for malignant neoplasm of rectum: Secondary | ICD-10-CM | POA: Diagnosis not present

## 2013-03-21 DIAGNOSIS — L570 Actinic keratosis: Secondary | ICD-10-CM | POA: Diagnosis not present

## 2013-03-21 DIAGNOSIS — D0460 Carcinoma in situ of skin of unspecified upper limb, including shoulder: Secondary | ICD-10-CM | POA: Diagnosis not present

## 2013-04-07 ENCOUNTER — Encounter: Payer: Self-pay | Admitting: Gastroenterology

## 2013-05-23 DIAGNOSIS — Z23 Encounter for immunization: Secondary | ICD-10-CM | POA: Diagnosis not present

## 2013-06-15 ENCOUNTER — Ambulatory Visit (HOSPITAL_BASED_OUTPATIENT_CLINIC_OR_DEPARTMENT_OTHER): Payer: Medicare Other | Admitting: Lab

## 2013-06-15 ENCOUNTER — Ambulatory Visit (HOSPITAL_BASED_OUTPATIENT_CLINIC_OR_DEPARTMENT_OTHER): Payer: Medicare Other | Admitting: Hematology & Oncology

## 2013-06-15 ENCOUNTER — Ambulatory Visit (AMBULATORY_SURGERY_CENTER): Payer: Self-pay

## 2013-06-15 VITALS — Ht 72.0 in | Wt 162.0 lb

## 2013-06-15 VITALS — BP 113/66 | HR 64 | Temp 97.9°F | Resp 16 | Ht 71.0 in | Wt 164.0 lb

## 2013-06-15 DIAGNOSIS — Z800 Family history of malignant neoplasm of digestive organs: Secondary | ICD-10-CM

## 2013-06-15 DIAGNOSIS — C9100 Acute lymphoblastic leukemia not having achieved remission: Secondary | ICD-10-CM

## 2013-06-15 DIAGNOSIS — C9110 Chronic lymphocytic leukemia of B-cell type not having achieved remission: Secondary | ICD-10-CM

## 2013-06-15 DIAGNOSIS — IMO0001 Reserved for inherently not codable concepts without codable children: Secondary | ICD-10-CM

## 2013-06-15 LAB — CBC WITH DIFFERENTIAL (CANCER CENTER ONLY)
HCT: 45.6 % (ref 38.7–49.9)
MCV: 97 fL (ref 82–98)
Platelets: 92 10*3/uL — ABNORMAL LOW (ref 145–400)
RBC: 4.68 10*6/uL (ref 4.20–5.70)
RDW: 14.6 % (ref 11.1–15.7)
WBC: 29.8 10*3/uL — ABNORMAL HIGH (ref 4.0–10.0)

## 2013-06-15 LAB — MANUAL DIFFERENTIAL (CHCC SATELLITE)
LYMPH: 89 % — ABNORMAL HIGH (ref 14–48)
MONO: 2 % (ref 0–13)

## 2013-06-15 LAB — CHCC SATELLITE - SMEAR

## 2013-06-15 MED ORDER — SOD PICOSULFATE-MAG OX-CIT ACD 10-3.5-12 MG-GM-GM PO PACK: 1.0000 | PACK | Freq: Once | ORAL | Status: DC

## 2013-06-15 NOTE — Progress Notes (Signed)
This office note has been dictated.

## 2013-06-16 NOTE — Progress Notes (Signed)
CC:   Geoffry Paradise, M.D. Nanetta Batty, M.D.  DIAGNOSES: 1. Chronic lymphocytic leukemia -- stage C. 2. Chorioretinitis of the right eye.  CURRENT THERAPY:  Observation.  INTERIM HISTORY:  Mr. Dipaola comes in for his followup.  We last saw him back in July.  He has been seen by Dr. Jacky Kindle.  Dr. Jacky Kindle has also noted that he has had some thrombocytopenia.  Mr. Shein is due for a colonoscopy in a couple weeks.  I told Mr. Shovlin that he can have the colonoscopy without any problems even though his platelet count might be below 100,000.  He had a flare-up of the chorioretinitis recently.  He tends to get these.  He did have a nice time over in [place].  He travels quite a bit with his wife.  [place] the mountains today [place], I think, a few days.  He has not noted any palpable lymph glands.  He has had no cough or shortness of breath.  His heart has been doing okay.  He has not noted any problems with abdominal pain.  There has been no change in bowel or bladder habits.  He has had no leg swelling.  He has had no joint issues.  He has had no headache.  PHYSICAL EXAMINATION:  General:  This is a well-developed, well- nourished white gentleman in no obvious distress.  Vital signs: Temperature of 97.9, pulse 64, respiratory rate 16, blood pressure 113/66.  Weight is 164 pounds.  Head and Neck:  Normocephalic, atraumatic skull.  There are no ocular or oral lesions.  There are no palpable cervical or supraclavicular lymph nodes.  Lungs:  Clear bilaterally.  Cardiac:  Regular rate and rhythm with a normal S1 and S2. There are no murmurs, rubs, or bruits.  Axillary:  Some slight fullness in the left axilla.  I do not detect any obvious lymph nodes.  Right axilla is unremarkable.  Abdomen:  Soft.  He has good bowel sounds. There is no fluid wave.  There is no palpable hepatosplenomegaly.  He has no inguinal adenopathy.  Back:  No tenderness over the spine, ribs, or  hips.  Extremities:  No clubbing, cyanosis, or edema.  He has good range of motion of his joints.  He has good muscle strength in upper and lower extremities.  Skin:  No rashes, ecchymoses, or petechiae. Neurological:  No focal neurological deficits.  LABORATORY STUDIES:  White cell count is 30, hemoglobin 14.5, hematocrit 45.6, platelet count 92,000.  White cell differential shows 9% segs, 89% lymphs, 2 monos.  Peripheral smear shows increase in lymphocytes.  These do appear to be mature.  He has some smudge cells.  I do not see any nucleoli within the lymphocytes.  His white cells show no spherocytes.  There are no schistocytes.  I see no nucleated red blood cells.  There are no target cells.  Platelets are slightly decreased in number.  Platelets are well granulated.  IMPRESSION:  Mr. Malkin is a 72 year old gentleman with longstanding chronic lymphocytic leukemia.  I have been following him now for about 11 or 12 years.  He is having no problems with this.  His white cell count tends to be trending upward.  His platelets are low, but relatively stable.  I do not see any evidence of hemolytic anemia.  I do not see any evidence of extensive marrow involvement that would affect his marrow function.  He has not had recurrent infections.  There are no growing lymph  nodes.  Again, I think we can just watch him for right now.  I do want to get him back in another 3 months.  He will be heading down to [place] next year in February.  Again, he and his wife travel quite a bit.  I want to make sure that we get him back before he travels to the [place] to make sure that everything is okay.    ______________________________ Josph Macho, M.D. PRE/MEDQ  D:  06/15/2013  T:  06/16/2013  Job:  [id number]

## 2013-06-20 ENCOUNTER — Telehealth: Payer: Self-pay | Admitting: Gastroenterology

## 2013-06-20 ENCOUNTER — Telehealth: Payer: Self-pay | Admitting: Hematology & Oncology

## 2013-06-20 LAB — PROTEIN ELECTROPHORESIS, SERUM, WITH REFLEX
Gamma Globulin: 11 % — ABNORMAL LOW (ref 11.1–18.8)
Total Protein, Serum Electrophoresis: 6.5 g/dL (ref 6.0–8.3)

## 2013-06-20 LAB — COMPREHENSIVE METABOLIC PANEL
ALT: 11 U/L (ref 0–53)
AST: 15 U/L (ref 0–37)
Albumin: 4.1 g/dL (ref 3.5–5.2)
CO2: 31 mEq/L (ref 19–32)
Calcium: 9 mg/dL (ref 8.4–10.5)
Chloride: 103 mEq/L (ref 96–112)
Potassium: 4.3 mEq/L (ref 3.5–5.3)

## 2013-06-20 LAB — IGG, IGA, IGM: IgG (Immunoglobin G), Serum: 980 mg/dL (ref 650–1600)

## 2013-06-20 LAB — RETICULOCYTES (CHCC): Retic Ct Pct: 1 % (ref 0.4–2.3)

## 2013-06-20 NOTE — Telephone Encounter (Signed)
Pt cx 2-2 I called back left message to call and reschedule

## 2013-06-20 NOTE — Telephone Encounter (Signed)
Pt will come by [facility] 4th floor and pick up sample of Prepopik

## 2013-06-26 ENCOUNTER — Ambulatory Visit (INDEPENDENT_AMBULATORY_CARE_PROVIDER_SITE_OTHER): Payer: Medicare Other | Admitting: Cardiology

## 2013-06-26 ENCOUNTER — Other Ambulatory Visit: Payer: Self-pay | Admitting: Cardiology

## 2013-06-26 ENCOUNTER — Emergency Department (HOSPITAL_COMMUNITY)
Admission: EM | Admit: 2013-06-26 | Discharge: 2013-06-26 | Disposition: A | Discharge status: Home / Self Care | Payer: Medicare Other | Attending: Emergency Medicine | Admitting: Emergency Medicine

## 2013-06-26 ENCOUNTER — Telehealth: Payer: Self-pay | Admitting: Cardiovascular Disease

## 2013-06-26 ENCOUNTER — Encounter: Payer: Self-pay | Admitting: Cardiology

## 2013-06-26 ENCOUNTER — Encounter (HOSPITAL_COMMUNITY): Payer: Self-pay | Admitting: Emergency Medicine

## 2013-06-26 VITALS — BP 104/80 | HR 98 | Ht 72.0 in | Wt 166.3 lb

## 2013-06-26 DIAGNOSIS — Z856 Personal history of leukemia: Secondary | ICD-10-CM | POA: Insufficient documentation

## 2013-06-26 DIAGNOSIS — E119 Type 2 diabetes mellitus without complications: Secondary | ICD-10-CM | POA: Insufficient documentation

## 2013-06-26 DIAGNOSIS — M6281 Muscle weakness (generalized): Secondary | ICD-10-CM | POA: Insufficient documentation

## 2013-06-26 DIAGNOSIS — I4891 Unspecified atrial fibrillation: Secondary | ICD-10-CM

## 2013-06-26 DIAGNOSIS — Z79899 Other long term (current) drug therapy: Secondary | ICD-10-CM | POA: Diagnosis not present

## 2013-06-26 DIAGNOSIS — Z7982 Long term (current) use of aspirin: Secondary | ICD-10-CM | POA: Insufficient documentation

## 2013-06-26 DIAGNOSIS — R42 Dizziness and giddiness: Secondary | ICD-10-CM | POA: Insufficient documentation

## 2013-06-26 LAB — CBC
HCT: 47.5 % (ref 39.0–52.0)
Hemoglobin: 15.8 g/dL (ref 13.0–17.0)
MCH: 32 pg (ref 26.0–34.0)
MCV: 96.3 fL (ref 78.0–100.0)
Platelets: 102 10*3/uL — ABNORMAL LOW (ref 150–400)
RBC: 4.93 MIL/uL (ref 4.22–5.81)
WBC: 26.6 10*3/uL — ABNORMAL HIGH (ref 4.0–10.5)

## 2013-06-26 LAB — BASIC METABOLIC PANEL
CO2: 30 mEq/L (ref 19–32)
Calcium: 9.6 mg/dL (ref 8.4–10.5)
Chloride: 105 mEq/L (ref 96–112)
Creatinine, Ser: 0.9 mg/dL (ref 0.50–1.35)
Glucose, Bld: 104 mg/dL — ABNORMAL HIGH (ref 70–99)
Sodium: 142 mEq/L (ref 135–145)

## 2013-06-26 LAB — TSH: TSH: 2.536 u[IU]/mL (ref 0.350–4.500)

## 2013-06-26 MED ORDER — SODIUM CHLORIDE 0.9 % IV BOLUS (SEPSIS)
250.0000 mL | Freq: Once | INTRAVENOUS | Status: AC
  Administered 2013-06-26: 250 mL via INTRAVENOUS

## 2013-06-26 MED ORDER — FLECAINIDE ACETATE 100 MG PO TABS
200.0000 mg | ORAL_TABLET | Freq: Once | ORAL | Status: DC
  Filled 2013-06-26: qty 2

## 2013-06-26 NOTE — Progress Notes (Signed)
HPI The patient presents for evaluation of palpitations.  He has a history of atrial fibrillation that was self limited in the past. He has had a workup and I was able to review records from last year. He had a slightly abnormal stress test last year but had normal coronaries on catheterization. He had an echocardiogram that was essentially unremarkable with a well preserved ejection fraction. He was in his usual state of health until this morning. He developed lightheadedness and some weakness and an irregular heart rate and was added to my schedule. He knows he was not in this rhythm yesterday. He's not describing any chest pressure, neck or arm discomfort. He's not having any shortness of breath, PND or orthopnea. He has had no weight gain or edema.  Allergies  Allergen Reactions  . Other Anaphylaxis    Shellfish  . Shellfish-Derived Products Anaphylaxis  . Adhesive [Tape] Other (See Comments)    REDNESS    Current Outpatient Prescriptions  Medication Sig Dispense Refill  . aspirin EC 81 MG tablet Take 81 mg by mouth daily.      . brimonidine (ALPHAGAN) 0.2 % ophthalmic solution Place 1 drop into the left eye 2 (two) times daily.       . DUREZOL 0.05 % EMUL Apply to eye 2 (two) times daily as needed.       . metoprolol succinate (TOPROL-XL) 25 MG 24 hr tablet Take 12.5 mg by mouth daily.      . Sod Picosulfate-Mag Ox-Cit Acd 10-3.5-12 MG-GM-GM PACK Take 1 kit by mouth once.  1 each  0  . timolol (TIMOPTIC) 0.5 % ophthalmic solution Place 1 drop into the left eye daily. Left eye       No current facility-administered medications for this visit.    Past Medical History  Diagnosis Date  . CLL (chronic lymphoblastic leukemia) 09/03/2011    dx 1999  . Diabetes mellitus,borderline 09/10/2011  . Atrial fibrillation     Past Surgical History  Procedure Laterality Date  . Rotator cuff repair  1999  . Cataract extraction  2012  . Tonsillectomy    . Cyst removal trunk  2010    back    . Cardiac catheterization  09/17/2011    Normal coronary arteries and LV function  . Cardiovascular stress test  09/15/2011    Mild-moderate perfusion defect due to infarct/scar w/ moderate perinfarct ischemia seen in basal inferoseptal, basal inferior, mid inferoseptal, mid inferior and apical inferior regions. Observed defect may in part be attributable to diagphragmatic attenuation, however there is also reversibility that would suggest a true defect. EKG negative for ischemia.  . Transthoracic echocardiogram  09/15/2011    EF >55%, Normal LV systolic function    ROS:  Positive for low back pain, trouble with urinary flow.  Otherwise as stated in the HPI and negative for all other systems.  PHYSICAL EXAM BP 104/80 GENERAL:  Well appearing HEENT:  Pupils equal round and reactive, fundi not visualized, oral mucosa unremarkable NECK:  No jugular venous distention, waveform within normal limits, carotid upstroke brisk and symmetric, no bruits, no thyromegaly LYMPHATICS:  No cervical, inguinal adenopathy LUNGS:  Clear to auscultation bilaterally BACK:  No CVA tenderness CHEST:  Unremarkable HEART:  PMI not displaced or sustained,S1 and S2 within normal limits, no S3,  no clicks, no rubs,  no murmurs irregular ABD:  Flat, positive bowel sounds normal in frequency in pitch, no bruits, no rebound, no guarding, no midline pulsatile mass, no  hepatomegaly, no splenomegaly EXT:  2 plus pulses throughout, no edema, no cyanosis no clubbing SKIN:  No rashes no nodules NEURO:  Cranial nerves II through XII grossly intact, motor grossly intact throughout PSYCH:  Cognitively intact, oriented to person place and time   EKG:  Atrial fibrillation, rate 98, axis within normal limits, intervals within normal limits, no acute ST-T wave changes, borderline anterior R wave progression. 06/26/2013   ASSESSMENT AND PLAN  ATRIAL FIBRILLATION:  The onset of this was this morning that is bothering the patient. He  has a structurally normal heart by recent cath an echocardiogram. Therefore, he is an ideal candidate for a pill in pocket flecainide 200 mg. I will send him to the emergency room for this so that he can be monitored for the appropriate duration. This if this works he could then be treated with this method at home. If he does not convert I would suggest DC cardioversion possibly with an overnight observation.  [name] has a CHA2DS2 - VASc score of 1 with a risk of stroke of 1.3%  and a HAS - BLED score of 1 with a low risk of bleeding.  Given this he does not need anticoagulation.

## 2013-06-26 NOTE — Progress Notes (Signed)
Pt arrived  No complaints no chest pain, no SOB last meal at 0730.  Keep NPO.  Plan for  Flecainide pill in pocket.  If no conversion may proceed with DCCV.  BP 80 systolic, will give 250 cc fluid bolus.

## 2013-06-26 NOTE — ED Notes (Addendum)
Vernona Rieger, cardiology NP called and informed of pt converting to sinus rhythm. Will come to see pt shortly.

## 2013-06-26 NOTE — Consult Note (Signed)
HPI  The patient presents for evaluation of palpitations. He has a history of atrial fibrillation that was self limited in the past. He has had a workup and I was able to review records from last year. He had a slightly abnormal stress test last year but had normal coronaries on catheterization. He had an echocardiogram that was essentially unremarkable with a well preserved ejection fraction. He was in his usual state of health until this morning. He developed lightheadedness and some weakness and an irregular heart rate and was added to my schedule. He knows he was not in this rhythm yesterday. He's not describing any chest pressure, neck or arm discomfort. He's not having any shortness of breath, PND or orthopnea. He has had no weight gain or edema.  Allergies   Allergen  Reactions   .  Other  Anaphylaxis     Shellfish   .  Shellfish-Derived Products  Anaphylaxis   .  Adhesive [Tape]  Other (See Comments)     REDNESS    Current Outpatient Prescriptions   Medication  Sig  Dispense  Refill   .  aspirin EC 81 MG tablet  Take 81 mg by mouth daily.     .  brimonidine (ALPHAGAN) 0.2 % ophthalmic solution  Place 1 drop into the left eye 2 (two) times daily.     .  DUREZOL 0.05 % EMUL  Apply to eye 2 (two) times daily as needed.     .  metoprolol succinate (TOPROL-XL) 25 MG 24 hr tablet  Take 12.5 mg by mouth daily.     .  Sod Picosulfate-Mag Ox-Cit Acd 10-3.5-12 MG-GM-GM PACK  Take 1 kit by mouth once.  1 each  0   .  timolol (TIMOPTIC) 0.5 % ophthalmic solution  Place 1 drop into the left eye daily. Left eye      No current facility-administered medications for this visit.    Past Medical History   Diagnosis  Date   .  CLL (chronic lymphoblastic leukemia)  09/03/2011     dx 1999   .  Diabetes mellitus,borderline  09/10/2011   .  Atrial fibrillation     Past Surgical History   Procedure  Laterality  Date   .  Rotator cuff repair   1999   .  Cataract extraction      Tonsillectomy     .   Cyst removal trunk   2010     back   .  Cardiac catheterization   09/17/2011     Normal coronary arteries and LV function   .  Cardiovascular stress test   09/15/2011     Mild-moderate perfusion defect due to infarct/scar w/ moderate perinfarct ischemia seen in basal inferoseptal, basal inferior, mid inferoseptal, mid inferior and apical inferior regions. Observed defect may in part be attributable to diagphragmatic attenuation, however there is also reversibility that would suggest a true defect. EKG negative for ischemia.   .  Transthoracic echocardiogram   09/15/2011     EF >55%, Normal LV systolic function   ROS: Positive for low back pain, trouble with urinary flow. Otherwise as stated in the HPI and negative for all other systems.  PHYSICAL EXAM  BP 104/80  GENERAL: Well appearing  HEENT: Pupils equal round and reactive, fundi not visualized, oral mucosa unremarkable  NECK: No jugular venous distention, waveform within normal limits, carotid upstroke brisk and symmetric, no bruits, no thyromegaly  LYMPHATICS: No cervical, inguinal adenopathy  LUNGS: Clear to  auscultation bilaterally  BACK: No CVA tenderness  CHEST: Unremarkable  HEART: PMI not displaced or sustained,S1 and S2 within normal limits, no S3, no clicks, no rubs, no murmurs irregular  ABD: Flat, positive bowel sounds normal in frequency in pitch, no bruits, no rebound, no guarding, no midline pulsatile mass, no hepatomegaly, no splenomegaly  EXT: 2 plus pulses throughout, no edema, no cyanosis no clubbing  SKIN: No rashes no nodules  NEURO: Cranial nerves II through XII grossly intact, motor grossly intact throughout  PSYCH: Cognitively intact, oriented to person place and time  EKG: Atrial fibrillation, rate 98, axis within normal limits, intervals within normal limits, no acute ST-T wave changes, borderline anterior R wave progression. 06/26/2013   ASSESSMENT AND PLAN  ATRIAL FIBRILLATION: The onset of this was this morning  that is bothering the patient. He has a structurally normal heart by recent cath an echocardiogram. Therefore, he is an ideal candidate for a pill in pocket flecainide 200 mg. I will send him to the emergency room for this so that he can be monitored for the appropriate duration. This if this works he could then be treated with this method at home. If he does not convert I would suggest DC cardioversion possibly with an overnight observation. Mr. NEFI MUSICH has a CHA2DS2 - VASc score of 1 with a risk of stroke of 1.3% and a HAS - BLED score of 1 with a low risk of bleeding. Given this he does not need anticoagulation.   Pt arrived, no change in assessment from above Dr. Kary Kos note .  NS 250 cc bolus given for NP of 80/ systolic.  Labs checked and before flecainide could be given pt converted to SR.  BP  Still low so another 250 cc bolus given.    Pt. Seen and Dr. Royann Shivers discussed his PAF with him.  I have seen and examined the patient along with Nada Boozer, NP.  I have reviewed the chart, notes and new data.  I agree with NP's note.  Mr. Guitron has infrequent, but symptomatic AFib with controlled rate without known structural heart disease or previous embolic events ("lone AF" CHADS score1). He converted spontaneously to NSR before flecainide could be administered.  I do not think daily antiarrhythmic Rx (other than his low dose beta blocker) is justified as the episodes are so rare. If frequency increases, I think he is an excellent candidate for RF ablation.  Suggest repeat outpatient echo, ASA, low dose beta blocker.  Thurmon Fair, MD, [facility] [phone] 06/26/2013, 5:01 PM

## 2013-06-26 NOTE — Telephone Encounter (Signed)
Please call asap-woke up real lightheaded and pulse is irregular. Thinks he needs to be seen,

## 2013-06-26 NOTE — ED Notes (Addendum)
Vernona Rieger, NP with cardiology called with lab results

## 2013-06-26 NOTE — Telephone Encounter (Signed)
Returned call and pt verified x 2.  Pt stated when he woke up this morning he was light-headed and his pulse was all over the place.  Stated it feels different from his other A-fib.  Pt denied CP or SOB.  Confirmed he is taking metoprolol succinate.  Appt scheduled today at 12:15pm w/ Dr. Caprice Red for evaluation.

## 2013-06-26 NOTE — ED Provider Notes (Signed)
CSN: [id number]     Arrival date & time 06/26/13  1326 History   First MD Initiated Contact with Patient 06/26/13 1345     Chief Complaint  Patient presents with  . Atrial Fibrillation   (Consider location/radiation/quality/duration/timing/severity/associated sxs/prior Treatment) HPI Pt presents with c/o feeling weak and lightheaded.  He was seen this morning at Dr. Jenene Slicker cardiology office and referred to the ED when he was found to be in afib.  Pt denies feeling palpitations but checked his pulse with feeling weak and noted it was irregular.  He has had episodes of afib x 3 in the past.  He denies any other symptoms currently or recent illness.  Was on his way to play golf this morning.  There are no other associated systemic symptoms, there are no other alleviating or modifying factors.   Past Medical History  Diagnosis Date  . CLL (chronic lymphoblastic leukemia) 09/03/2011    dx 1999  . Diabetes mellitus,borderline 09/10/2011  . Atrial fibrillation    Past Surgical History  Procedure Laterality Date  . Rotator cuff repair  1999  . Cataract extraction  2012  . Tonsillectomy    . Cyst removal trunk  2010    back  . Cardiac catheterization  09/17/2011    Normal coronary arteries and LV function  . Cardiovascular stress test  09/15/2011    Mild-moderate perfusion defect due to infarct/scar w/ moderate perinfarct ischemia seen in basal inferoseptal, basal inferior, mid inferoseptal, mid inferior and apical inferior regions. Observed defect may in part be attributable to diagphragmatic attenuation, however there is also reversibility that would suggest a true defect. EKG negative for ischemia.  . Transthoracic echocardiogram  09/15/2011    EF >55%, Normal LV systolic function   Family History  Problem Relation Age of Onset  . Cancer Mother     Multiple myeloma  . Heart attack Father     Died age 2  . Stroke Father   . Cancer Sister     Breast cancer  . Heart attack Paternal  Grandmother   . Cancer Paternal Grandmother     Breast cancer  . Stroke Paternal Grandfather    History  Substance Use Topics  . Smoking status: Never Smoker   . Smokeless tobacco: Never Used  . Alcohol Use: 2.4 oz/week    4 Glasses of wine per week    Review of Systems ROS reviewed and all otherwise negative except for mentioned in HPI  Allergies  Other; Shellfish-derived products; and Adhesive  Home Medications   Current Outpatient Rx  Name  Route  Sig  Dispense  Refill  . aspirin EC 81 MG tablet   Oral   Take 81 mg by mouth daily.         . brimonidine (ALPHAGAN) 0.2 % ophthalmic solution   Left Eye   Place 1 drop into the left eye 2 (two) times daily.          . Difluprednate (DUREZOL) 0.05 % EMUL   Right Eye   Place 1 drop into the right eye daily as needed (for affected eye).         . metoprolol succinate (TOPROL-XL) 25 MG 24 hr tablet   Oral   Take 12.5 mg by mouth daily.         . timolol (TIMOPTIC) 0.5 % ophthalmic solution   Left Eye   Place 1 drop into the left eye daily. Left eye  BP 80/58  Pulse 87  Temp(Src) 98.2 F (36.8 C) (Oral)  Resp 12  SpO2 95% Vitals reviewed Physical Exam Physical Examination: General appearance - alert, well appearing, and in no distress Mental status - alert, oriented to person, place, and time Eyes - no conjunctival injection, no scleral icterus Mouth - mucous membranes moist, pharynx normal without lesions Neck - supple, no significant adenopathy Chest - clear to auscultation, no wheezes, rales or rhonchi, symmetric air entry Heart - normal rate, regular rhythm, normal S1, S2, no murmurs, rubs, clicks or gallops Abdomen - soft, nontender, nondistended, no masses or organomegaly Extremities - peripheral pulses normal, no pedal edema, no clubbing or cyanosis Skin - normal coloration and turgor, no rashes  ED Course  Procedures (including critical care time)  2:59 PM d/w cardiology- pt is  coming from Dr. Jenene Slicker office.  Cardiology to see in the ED for medical cardioversion attempt per Dr. Jenene Slicker note   Date: 06/26/2013  Rate: 93  Rhythm: atrial fibrillation  QRS Axis: normal  Intervals: normal  ST/T Wave abnormalities: q waves in anterior leads, poor r wave progression  Conduction Disutrbances:none  Narrative Interpretation:   Old EKG Reviewed: prior EKG was in sinus rhythm EKG not available in muse for interpretation Labs Review Labs Reviewed  CBC  BASIC METABOLIC PANEL  TSH   Imaging Review No results found.  EKG Interpretation   None       MDM   1. Atrial fibrillation    Pt presenting with atrial fibrillation.  He has had episodes of afib in the past.  Was seen by Dr. Antoine Poche this morning who recommends medical cardioversion and DC cardioversion if that is not successful.  Labs ordered.  Talked to cardiology who will come see patient in the ED.     Ethelda Chick, MD 06/26/13 1500

## 2013-06-26 NOTE — ED Notes (Signed)
Pt has hx of afib and this am he started to get dizzy and lightheaded checked his pulse and was found to be in Afib went to his cards dr and was sent for further tests and a pill that they have to  Watch  For 5 hours

## 2013-06-26 NOTE — ED Notes (Signed)
Pt sent by Dr's office for atrial fibrillation. Had near syncope episode this am. Reports dizziness at time. No chest pain. Hx of 3 episodes of A-Fib.

## 2013-06-29 ENCOUNTER — Encounter: Payer: Medicare Other | Admitting: Gastroenterology

## 2013-07-07 ENCOUNTER — Other Ambulatory Visit: Payer: Self-pay | Admitting: Dermatology

## 2013-07-07 DIAGNOSIS — C44519 Basal cell carcinoma of skin of other part of trunk: Secondary | ICD-10-CM | POA: Diagnosis not present

## 2013-07-07 DIAGNOSIS — Z85828 Personal history of other malignant neoplasm of skin: Secondary | ICD-10-CM | POA: Diagnosis not present

## 2013-07-07 DIAGNOSIS — L820 Inflamed seborrheic keratosis: Secondary | ICD-10-CM | POA: Diagnosis not present

## 2013-07-07 DIAGNOSIS — D239 Other benign neoplasm of skin, unspecified: Secondary | ICD-10-CM | POA: Diagnosis not present

## 2013-07-07 DIAGNOSIS — D485 Neoplasm of uncertain behavior of skin: Secondary | ICD-10-CM | POA: Diagnosis not present

## 2013-07-07 DIAGNOSIS — D0460 Carcinoma in situ of skin of unspecified upper limb, including shoulder: Secondary | ICD-10-CM | POA: Diagnosis not present

## 2013-07-07 DIAGNOSIS — L570 Actinic keratosis: Secondary | ICD-10-CM | POA: Diagnosis not present

## 2013-07-07 DIAGNOSIS — L821 Other seborrheic keratosis: Secondary | ICD-10-CM | POA: Diagnosis not present

## 2013-07-18 ENCOUNTER — Ambulatory Visit (HOSPITAL_COMMUNITY)
Admission: RE | Admit: 2013-07-18 | Discharge: 2013-07-18 | Disposition: A | Discharge status: Home / Self Care | Payer: Medicare Other | Source: Ambulatory Visit | Attending: Cardiology | Admitting: Cardiology

## 2013-07-18 DIAGNOSIS — I4891 Unspecified atrial fibrillation: Secondary | ICD-10-CM | POA: Insufficient documentation

## 2013-07-18 NOTE — Progress Notes (Signed)
2D Echo Performed 07/18/2013    Michaeljoseph Revolorio, RCS  

## 2013-07-21 ENCOUNTER — Encounter: Payer: Self-pay | Admitting: Cardiovascular Disease

## 2013-07-21 DIAGNOSIS — C44519 Basal cell carcinoma of skin of other part of trunk: Secondary | ICD-10-CM | POA: Diagnosis not present

## 2013-07-21 DIAGNOSIS — D0460 Carcinoma in situ of skin of unspecified upper limb, including shoulder: Secondary | ICD-10-CM | POA: Diagnosis not present

## 2013-07-24 ENCOUNTER — Ambulatory Visit: Payer: Medicare Other | Admitting: Cardiovascular Disease

## 2013-07-24 ENCOUNTER — Ambulatory Visit (INDEPENDENT_AMBULATORY_CARE_PROVIDER_SITE_OTHER): Payer: Medicare Other | Admitting: Cardiovascular Disease

## 2013-07-24 ENCOUNTER — Telehealth: Payer: Self-pay | Admitting: Gastroenterology

## 2013-07-24 ENCOUNTER — Encounter: Payer: Self-pay | Admitting: Cardiovascular Disease

## 2013-07-24 VITALS — BP 125/74 | HR 69 | Ht 72.0 in | Wt 167.8 lb

## 2013-07-24 DIAGNOSIS — I4891 Unspecified atrial fibrillation: Secondary | ICD-10-CM | POA: Diagnosis not present

## 2013-07-24 MED ORDER — METOPROLOL SUCCINATE ER 25 MG PO TB24: 25.0000 mg | ORAL_TABLET | Freq: Every day | ORAL | Status: DC

## 2013-07-24 NOTE — Telephone Encounter (Signed)
Per cardiology notes 07/24/13 patient has no further cardiology issues.  Normal SR and normal LV function.  Patient is rescheduled for his colonoscopy for 09/21/13 9:30.  We reviewed his new instruction date and time.

## 2013-07-24 NOTE — Patient Instructions (Signed)
Your physician recommends that you schedule a follow-up appointment in: 1 year  

## 2013-07-24 NOTE — Assessment & Plan Note (Signed)
Infrequent episodes with recent episode last month, prior to that to half years ago. He does have normal systolic function and normal coronary arteries. The plan was to treat him with flecainide, pill in the pocket, in the  hospital with observation for several hours thereafter however he converted on his own and has had no recurrent symptoms.

## 2013-07-24 NOTE — Progress Notes (Signed)
07/24/2013 Lucas [name]   [birth year]  [id number]  Primary Physician ARONSON,[name], MD Primary Cardiologist: Runell Gess MD [name]   HPI: The patient is a very pleasant 72 year old, thin and fit-appearing, married Caucasian male, father to 1 stepchild, grandfather to 2 step-grandchildren who I last saw in the office 6 months ago. He had diagnostic cath performed by me in January after a positive Myoview that revealed normal coronary arteries, normal LV function suggesting a false positive test. He has PAF with RVR in the past with atypical chest pain. He also has CLL which is quiescent followed by Dr. Arlan Organ. He did have a 3-week photo shoot safari in [place] in the [place] after I cath'd him and has taken [employer]. He is otherwise active and asymptomatic. His most recent lab work performed in January revealed total cholesterol 168, LDL 107, HDL 44. He was admitted to [facility] ER on 06/27/13 by Dr. Antoine Poche for A. Fib. Plans were to convert him with oral tonight however he converted on his own. He was seen in the [facility] ER by Dr. Royann Shivers. He's had no recurrent symptoms.    Current Outpatient Prescriptions  Medication Sig Dispense Refill  . aspirin EC 81 MG tablet Take 81 mg by mouth daily.      . brimonidine (ALPHAGAN) 0.2 % ophthalmic solution Place 1 drop into the left eye 2 (two) times daily.       . Difluprednate (DUREZOL) 0.05 % EMUL Place 1 drop into the right eye daily as needed (for affected eye).      . metoprolol succinate (TOPROL-XL) 25 MG 24 hr tablet Take 25 mg by mouth daily.       . timolol (TIMOPTIC) 0.5 % ophthalmic solution Place 1 drop into the left eye daily. Left eye       No current facility-administered medications for this visit.    Allergies  Allergen Reactions  . Other Anaphylaxis    Shellfish  . Shellfish-Derived Products Anaphylaxis  . Adhesive [Tape] Other (See Comments)    REDNESS    History   Social History   . Marital Status: Married    Spouse Name: N/A    Number of Children: 1  . Years of Education: N/A   Occupational History  .     Social History Main Topics  . Smoking status: Never Smoker   . Smokeless tobacco: Never Used  . Alcohol Use: 2.4 oz/week    4 Glasses of wine per week  . Drug Use: No  . Sexual Activity: Not on file   Other Topics Concern  . Not on file   Social History Narrative   Lives with wife.       Review of Systems: General: negative for chills, fever, night sweats or weight changes.  Cardiovascular: negative for chest pain, dyspnea on exertion, edema, orthopnea, palpitations, paroxysmal nocturnal dyspnea or shortness of breath Dermatological: negative for rash Respiratory: negative for cough or wheezing Urologic: negative for hematuria Abdominal: negative for nausea, vomiting, diarrhea, bright red blood per rectum, melena, or hematemesis Neurologic: negative for visual changes, syncope, or dizziness All other systems reviewed and are otherwise negative except as noted above.    Blood pressure 125/74, pulse 69, height 6' (1.829 m), weight 167 lb 12.8 oz (76.114 kg).  General appearance: alert and no distress Neck: no adenopathy, no carotid bruit, no JVD, supple, symmetrical, trachea midline and thyroid not enlarged, symmetric, no tenderness/mass/nodules Lungs: clear to auscultation bilaterally Heart:  regular rate and rhythm, S1, S2 normal, no murmur, click, rub or gallop Extremities: extremities normal, atraumatic, no cyanosis or edema  EKG normal sinus rhythm at 69 without ST or T wave changes  ASSESSMENT AND PLAN:   Atrial fibrillation, new onset, converted to SR,Sbrady. Infrequent episodes with recent episode last month, prior to that to half years ago. He does have normal systolic function and normal coronary arteries. The plan was to treat him with flecainide, pill in the pocket, in the  hospital with observation for several hours thereafter  however he converted on his own and has had no recurrent symptoms.      Runell Gess MD FACP,FACC,FAHA, [facility] 07/24/2013 11:15 AM

## 2013-08-23 DIAGNOSIS — H409 Unspecified glaucoma: Secondary | ICD-10-CM | POA: Diagnosis not present

## 2013-08-23 DIAGNOSIS — H401290 Low-tension glaucoma, unspecified eye, stage unspecified: Secondary | ICD-10-CM | POA: Diagnosis not present

## 2013-08-23 DIAGNOSIS — H35379 Puckering of macula, unspecified eye: Secondary | ICD-10-CM | POA: Diagnosis not present

## 2013-08-30 ENCOUNTER — Other Ambulatory Visit: Payer: Self-pay | Admitting: Cardiovascular Disease

## 2013-09-18 ENCOUNTER — Other Ambulatory Visit: Payer: Medicare Other | Admitting: Lab

## 2013-09-18 ENCOUNTER — Ambulatory Visit: Payer: Medicare Other | Admitting: Hematology & Oncology

## 2013-09-18 DIAGNOSIS — N401 Enlarged prostate with lower urinary tract symptoms: Secondary | ICD-10-CM | POA: Diagnosis not present

## 2013-09-18 DIAGNOSIS — E119 Type 2 diabetes mellitus without complications: Secondary | ICD-10-CM | POA: Diagnosis not present

## 2013-09-18 DIAGNOSIS — E785 Hyperlipidemia, unspecified: Secondary | ICD-10-CM | POA: Diagnosis not present

## 2013-09-18 DIAGNOSIS — C9110 Chronic lymphocytic leukemia of B-cell type not having achieved remission: Secondary | ICD-10-CM | POA: Diagnosis not present

## 2013-09-19 ENCOUNTER — Other Ambulatory Visit (HOSPITAL_BASED_OUTPATIENT_CLINIC_OR_DEPARTMENT_OTHER): Payer: Medicare Other | Admitting: Lab

## 2013-09-19 ENCOUNTER — Ambulatory Visit (HOSPITAL_BASED_OUTPATIENT_CLINIC_OR_DEPARTMENT_OTHER): Payer: Medicare Other | Admitting: Hematology & Oncology

## 2013-09-19 ENCOUNTER — Encounter: Payer: Self-pay | Admitting: Hematology & Oncology

## 2013-09-19 VITALS — BP 115/64 | HR 74 | Temp 97.4°F | Resp 18 | Ht 71.0 in | Wt 169.0 lb

## 2013-09-19 DIAGNOSIS — IMO0001 Reserved for inherently not codable concepts without codable children: Secondary | ICD-10-CM

## 2013-09-19 DIAGNOSIS — C9110 Chronic lymphocytic leukemia of B-cell type not having achieved remission: Secondary | ICD-10-CM

## 2013-09-19 DIAGNOSIS — I4891 Unspecified atrial fibrillation: Secondary | ICD-10-CM

## 2013-09-19 LAB — MANUAL DIFFERENTIAL (CHCC SATELLITE)
ALC: 29.9 10*3/uL — ABNORMAL HIGH (ref 0.9–3.3)
ANC (CHCC HP manual diff): 3 10*3/uL (ref 1.5–6.5)
LYMPH: 90 % — ABNORMAL HIGH (ref 14–48)
MONO: 1 % (ref 0–13)
PLATELET MORPHOLOGY: NORMAL
PLT EST ~~LOC~~: DECREASED
SEG: 9 % — ABNORMAL LOW (ref 40–75)

## 2013-09-19 LAB — CHCC SATELLITE - SMEAR

## 2013-09-19 LAB — CBC WITH DIFFERENTIAL (CANCER CENTER ONLY)
HCT: 45.1 % (ref 38.7–49.9)
HGB: 14.2 g/dL (ref 13.0–17.1)
MCH: 30.7 pg (ref 28.0–33.4)
MCHC: 31.5 g/dL — ABNORMAL LOW (ref 32.0–35.9)
MCV: 97 fL (ref 82–98)
PLATELETS: 87 10*3/uL — AB (ref 145–400)
RBC: 4.63 10*6/uL (ref 4.20–5.70)
RDW: 14.4 % (ref 11.1–15.7)
WBC: 33.3 10*3/uL — ABNORMAL HIGH (ref 4.0–10.0)

## 2013-09-19 NOTE — Progress Notes (Signed)
This office note has been dictated.

## 2013-09-20 DIAGNOSIS — Z85828 Personal history of other malignant neoplasm of skin: Secondary | ICD-10-CM | POA: Diagnosis not present

## 2013-09-20 DIAGNOSIS — L821 Other seborrheic keratosis: Secondary | ICD-10-CM | POA: Diagnosis not present

## 2013-09-20 DIAGNOSIS — D485 Neoplasm of uncertain behavior of skin: Secondary | ICD-10-CM | POA: Diagnosis not present

## 2013-09-20 DIAGNOSIS — L570 Actinic keratosis: Secondary | ICD-10-CM | POA: Diagnosis not present

## 2013-09-20 NOTE — Progress Notes (Signed)
CC:   Lucas [name], M.D. Lucas [name], M.D.  DIAGNOSES: 1. Chronic lymphocytic leukemia - stage C. 2. Chorioretinitis of the right eye. 3. Paroxysmal atrial fibrillation.  CURRENT THERAPY:  Observation.  INTERIM HISTORY:  Lucas [name] comes in for his followup.  He will be heading down to [place] in a week or so.  He will be going now for a couple of weeks.  He and his wife going down with a group.  He is looking forward to this.  He has had no issues with his atrial ablation.  Dr. Gwenlyn [name] is following him for this.  Lucas [name] has not had any admissions for his atrial fibrillation.  He did have an echocardiogram done in December.  Echocardiogram showed an ejection fraction of 60% to 65%.  He did have an enlarged left atrium.  Lucas [name] has not had any problems with bleeding.  He has had no fever.  He has had no swollen lymph nodes.  He has had no change in bowel or bladder habits.  He has had no change in medications.  PHYSICAL EXAMINATION:  General:  This is a well-developed, well- nourished white gentleman, in no obvious distress.  Vital Signs: Temperature of 97.4, pulse of 74, respiratory rate 18, blood pressure 115/64, weight is 169 pounds.  Head and Neck:  Normocephalic, atraumatic skull.  There are no ocular or oral lesions.  I cannot palpate any cervical or supraclavicular lymph nodes.  There may be some slight fullness in the left supraclavicular region.  Lungs:  Clear.  Cardiac: Regular rate and rhythm.  I do not hear any atrial ablation changes.  He has no murmurs, rubs, or bruits.  Axillary:  Some slight fullness in the left axilla.  No discrete adenopathy is noted in the axilla bilaterally. Abdomen:  Soft.  He has good bowel sounds.  There is no fluid wave. There is no palpable hepatosplenomegaly.  Extremities:  No clubbing, cyanosis, or edema.  He has good range motion of his joints.  He has good strength in his arms and legs.  Skin:  No rashes,  ecchymoses, or petechiae.  LABORATORY STUDIES:  White cell count of 33.3, hemoglobin 14.2, hematocrit 45.1, platelet count is 87,000.  MCV is 97.  IMPRESSION:  Lucas [name] is a 73 year old gentleman with chronic lymphocytic leukemia.  We have not had to treat him as of yet.  He does have thrombocytopenia, but this is holding relatively stable.  We will plan to get him back in another four months or so.  I think this would be a reasonable amount of time for followup.  He always knows that he can come in to see [place] if necessary before I would see him back formally.    ______________________________ Lucas [name], M.D. PRE/MEDQ  D:  09/19/2013  T:  09/20/2013  Job:  [id number]

## 2013-09-21 ENCOUNTER — Encounter: Payer: Self-pay | Admitting: Gastroenterology

## 2013-09-21 ENCOUNTER — Ambulatory Visit (AMBULATORY_SURGERY_CENTER): Payer: Medicare Other | Admitting: Gastroenterology

## 2013-09-21 VITALS — BP 102/68 | HR 58 | Temp 96.8°F | Resp 19 | Ht 72.0 in | Wt 162.0 lb

## 2013-09-21 DIAGNOSIS — I4891 Unspecified atrial fibrillation: Secondary | ICD-10-CM | POA: Diagnosis not present

## 2013-09-21 DIAGNOSIS — Z1211 Encounter for screening for malignant neoplasm of colon: Secondary | ICD-10-CM | POA: Diagnosis not present

## 2013-09-21 DIAGNOSIS — Z856 Personal history of leukemia: Secondary | ICD-10-CM | POA: Diagnosis not present

## 2013-09-21 DIAGNOSIS — D126 Benign neoplasm of colon, unspecified: Secondary | ICD-10-CM

## 2013-09-21 DIAGNOSIS — Z800 Family history of malignant neoplasm of digestive organs: Secondary | ICD-10-CM

## 2013-09-21 DIAGNOSIS — E119 Type 2 diabetes mellitus without complications: Secondary | ICD-10-CM | POA: Diagnosis not present

## 2013-09-21 MED ORDER — SODIUM CHLORIDE 0.9 % IV SOLN: 500.0000 mL | INTRAVENOUS | Status: DC

## 2013-09-21 NOTE — Patient Instructions (Signed)
YOU HAD AN ENDOSCOPIC PROCEDURE TODAY AT THE [facility]: Refer to the procedure report that was given to you for any specific questions about what was found during the examination.  If the procedure report does not answer your questions, please call your gastroenterologist to clarify.  If you requested that your care partner not be given the details of your procedure findings, then the procedure report has been included in a sealed envelope for you to review at your convenience later.  YOU SHOULD EXPECT: Some feelings of bloating in the abdomen. Passage of more gas than usual.  Walking can help get rid of the air that was put into your GI tract during the procedure and reduce the bloating. If you had a lower endoscopy (such as a colonoscopy or flexible sigmoidoscopy) you may notice spotting of blood in your stool or on the toilet paper. If you underwent a bowel prep for your procedure, then you may not have a normal bowel movement for a few days.  DIET: Your first meal following the procedure should be a light meal and then it is ok to progress to your normal diet.  A half-sandwich or bowl of soup is an example of a good first meal.  Heavy or fried foods are harder to digest and may make you feel nauseous or bloated.  Likewise meals heavy in dairy and vegetables can cause extra gas to form and this can also increase the bloating.  Drink plenty of fluids but you should avoid alcoholic beverages for 24 hours.  ACTIVITY: Your care partner should take you home directly after the procedure.  You should plan to take it easy, moving slowly for the rest of the day.  You can resume normal activity the day after the procedure however you should NOT DRIVE or use heavy machinery for 24 hours (because of the sedation medicines used during the test).    SYMPTOMS TO REPORT IMMEDIATELY: A gastroenterologist can be reached at any hour.  During normal business hours, 8:30 AM to 5:00 PM Monday through Friday,  call [phone].  After hours and on weekends, please call the GI answering service at [phone] who will take a message and have the physician on call contact you.   Following lower endoscopy (colonoscopy or flexible sigmoidoscopy):  Excessive amounts of blood in the stool  Significant tenderness or worsening of abdominal pains  Swelling of the abdomen that is new, acute  Fever of 100F or higher  FOLLOW UP: If any biopsies were taken you will be contacted by phone or by letter within the next 1-3 weeks.  Call your gastroenterologist if you have not heard about the biopsies in 3 weeks.  Our staff will call the home number listed on your records the next business day following your procedure to check on you and address any questions or concerns that you may have at that time regarding the information given to you following your procedure. This is a courtesy call and so if there is no answer at the home number and we have not heard from you through the emergency physician on call, we will assume that you have returned to your regular daily activities without incident.  SIGNATURES/CONFIDENTIALITY: You and/or your care partner have signed paperwork which will be entered into your electronic medical record.  These signatures attest to the fact that that the information above on your After Visit Summary has been reviewed and is understood.  Full responsibility of the confidentiality of this   discharge information lies with you and/or your care-partner.  Please continue your normal medications  Please read handouts about polyps and hemorrhoids  Follow up colonoscopy in 5 years

## 2013-09-21 NOTE — Op Note (Signed)
[place]  [address] [place], [id number]   COLONOSCOPY PROCEDURE REPORT  PATIENT: Lucas [name], Lucas [name]  MR#: [id number] BIRTHDATE: 1941-05-14 , 72  yrs. old GENDER: Male ENDOSCOPIST: Ladene Artist, MD, [facility] PROCEDURE DATE:  09/21/2013 PROCEDURE:   Colonoscopy with biopsy First Screening Colonoscopy - Avg.  risk and is 50 yrs.  old or older - No.  Prior Negative Screening - Now for repeat screening. Above average risk  History of Adenoma - Now for follow-up colonoscopy & has been > or = to 3 yrs.  N/A  Polyps Removed Today? Yes. ASA CLASS:   Class III INDICATIONS:elevated risk screening and Patient's immediate family history of colon cancer. MEDICATIONS: MAC sedation, administered by CRNA and propofol (Diprivan) 150mg  IV DESCRIPTION OF PROCEDURE:   After the risks benefits and alternatives of the procedure were thoroughly explained, informed consent was obtained.  A digital rectal exam revealed no abnormalities of the rectum.   The LB [id number]  endoscope was introduced through the anus and advanced to the cecum, which was identified by both the appendix and ileocecal valve. No adverse events experienced.   The quality of the prep was good, using MoviPrep  The instrument was then slowly withdrawn as the colon was fully examined.  COLON FINDINGS: A sessile polyp measuring 4 mm in size was found at the cecum.  A polypectomy was performed with cold forceps.  The resection was complete and the polyp tissue was completely retrieved.   The colon was otherwise normal.  There was no diverticulosis, inflammation, polyps or cancers unless previously stated.  Retroflexed views revealed small internal hemorrhoids. The time to cecum=3 minutes 26 seconds.  Withdrawal time=9 minutes 57 seconds.  The scope was withdrawn and the procedure completed. COMPLICATIONS: There were no complications.  ENDOSCOPIC IMPRESSION: 1.   Sessile polyp measuring 4 mm at the cecum;  polypectomy performed with cold forceps 2.   Small internal hemorrhoids  RECOMMENDATIONS: 1.  Await pathology results 2.  Repeat Colonoscopy in 5 years.  eSigned:  Ladene Artist, MD, [facility] 09/21/2013 9:59 AM   cc: Burnard Bunting, MD

## 2013-09-21 NOTE — Progress Notes (Signed)
Lidocaine-40mg IV prior to Propofol InductionPropofol given over incremental dosages 

## 2013-09-21 NOTE — Progress Notes (Signed)
Called to room to assist during endoscopic procedure.  Patient ID and intended procedure confirmed with present staff. Received instructions for my participation in the procedure from the performing physician.  

## 2013-09-22 ENCOUNTER — Telehealth: Payer: Self-pay | Admitting: *Deleted

## 2013-09-22 NOTE — Telephone Encounter (Signed)
  Follow up Call-  Call back number 09/21/2013  Post procedure Call Back phone  # [phone]  Permission to leave phone message Yes     Patient questions:  Do you have a fever, pain , or abdominal swelling? no Pain Score  0 *  Have you tolerated food without any problems? yes  Have you been able to return to your normal activities? yes  Do you have any questions about your discharge instructions: Diet   no Medications  no Follow up visit  no  Do you have questions or concerns about your Care? no  Actions: * If pain score is 4 or above: No action needed, pain <4.

## 2013-09-26 ENCOUNTER — Encounter: Payer: Self-pay | Admitting: Gastroenterology

## 2013-10-17 DIAGNOSIS — IMO0002 Reserved for concepts with insufficient information to code with codable children: Secondary | ICD-10-CM | POA: Diagnosis not present

## 2013-10-17 DIAGNOSIS — J069 Acute upper respiratory infection, unspecified: Secondary | ICD-10-CM | POA: Diagnosis not present

## 2013-10-25 ENCOUNTER — Other Ambulatory Visit: Payer: Self-pay | Admitting: Dermatology

## 2013-10-25 DIAGNOSIS — D485 Neoplasm of uncertain behavior of skin: Secondary | ICD-10-CM | POA: Diagnosis not present

## 2013-10-25 DIAGNOSIS — D0430 Carcinoma in situ of skin of unspecified part of face: Secondary | ICD-10-CM | POA: Diagnosis not present

## 2013-10-25 DIAGNOSIS — D0439 Carcinoma in situ of skin of other parts of face: Secondary | ICD-10-CM | POA: Diagnosis not present

## 2013-11-16 ENCOUNTER — Telehealth: Payer: Self-pay | Admitting: Cardiovascular Disease

## 2013-11-16 MED ORDER — METOPROLOL SUCCINATE ER 25 MG PO TB24: 12.5000 mg | ORAL_TABLET | Freq: Every day | ORAL | Status: DC

## 2013-11-16 NOTE — Telephone Encounter (Signed)
Needs a new prescription sent to [facility] for Metropolol. They are asking that we fax it to [phone] and need to place his [facility] # on it and that is [id number]. [facility] says the member id needs to go on it his name and address .Marland Kitchen

## 2013-11-16 NOTE — Telephone Encounter (Signed)
Returned call and pt verified x 2.  Pt informed message received and RN needs to review metoprolol dose as it was changed at his oncology visit after he saw Dr. Gwenlyn Found.  Pt verbalized understanding and stated he is taking metoprolol  ER 25 mg 1/2 tab daily.  Pt informed script will be sent and RN reviewed the rest of med list.  Pt verbalized understanding and agreed w/ plan.  Refill(s) sent to pharmacy.

## 2013-11-29 ENCOUNTER — Telehealth: Payer: Self-pay | Admitting: Hematology & Oncology

## 2013-11-29 NOTE — Telephone Encounter (Signed)
Pt aware moved 6-3 to 6-5. Per RN ok to move

## 2014-01-17 ENCOUNTER — Ambulatory Visit: Payer: Medicare Other | Admitting: Hematology & Oncology

## 2014-01-17 ENCOUNTER — Other Ambulatory Visit: Payer: Medicare Other | Admitting: Lab

## 2014-01-19 ENCOUNTER — Other Ambulatory Visit (HOSPITAL_BASED_OUTPATIENT_CLINIC_OR_DEPARTMENT_OTHER): Payer: Medicare Other | Admitting: Lab

## 2014-01-19 ENCOUNTER — Ambulatory Visit (HOSPITAL_BASED_OUTPATIENT_CLINIC_OR_DEPARTMENT_OTHER): Payer: Medicare Other | Admitting: Hematology & Oncology

## 2014-01-19 ENCOUNTER — Encounter: Payer: Self-pay | Admitting: Hematology & Oncology

## 2014-01-19 VITALS — BP 106/62 | HR 64 | Temp 98.4°F | Resp 18 | Ht 71.0 in | Wt 163.0 lb

## 2014-01-19 DIAGNOSIS — I4891 Unspecified atrial fibrillation: Secondary | ICD-10-CM

## 2014-01-19 DIAGNOSIS — D696 Thrombocytopenia, unspecified: Secondary | ICD-10-CM | POA: Diagnosis not present

## 2014-01-19 DIAGNOSIS — C9110 Chronic lymphocytic leukemia of B-cell type not having achieved remission: Secondary | ICD-10-CM

## 2014-01-19 DIAGNOSIS — IMO0001 Reserved for inherently not codable concepts without codable children: Secondary | ICD-10-CM

## 2014-01-19 LAB — CHCC SATELLITE - SMEAR

## 2014-01-19 LAB — CBC WITH DIFFERENTIAL (CANCER CENTER ONLY)
HEMATOCRIT: 46.7 % (ref 38.7–49.9)
HGB: 15.1 g/dL (ref 13.0–17.1)
MCH: 31.6 pg (ref 28.0–33.4)
MCHC: 32.3 g/dL (ref 32.0–35.9)
MCV: 98 fL (ref 82–98)
Platelets: 94 10*3/uL — ABNORMAL LOW (ref 145–400)
RBC: 4.78 10*6/uL (ref 4.20–5.70)
RDW: 14.9 % (ref 11.1–15.7)
WBC: 23.6 10*3/uL — ABNORMAL HIGH (ref 4.0–10.0)

## 2014-01-19 LAB — MANUAL DIFFERENTIAL (CHCC SATELLITE)
ALC: 20 10*3/uL — ABNORMAL HIGH (ref 0.9–3.3)
ANC (CHCC MAN DIFF): 3.3 10*3/uL (ref 1.5–6.5)
LYMPH: 85 % — ABNORMAL HIGH (ref 14–48)
MONO: 1 % (ref 0–13)
PLT EST ~~LOC~~: DECREASED
SEG: 14 % — ABNORMAL LOW (ref 40–75)

## 2014-01-19 NOTE — Progress Notes (Signed)
Hematology and Oncology Follow Up Visit  Lucas [name] [id number] 28-Jan-1941 73 y.o. 01/19/2014   Principle Diagnosis:  1. Chronic lymphocytic leukemia - stage C. 2. Chorioretinitis of the right eye. 3. Paroxysmal atrial fibrillation.  Current Therapy:    Observation     Interim History:  Lucas [name] is back for followup. We saw him 4 months ago. Since then, he's been doing quite well. His right I actually has been getting a little better.  He's had no problems with infections. He's had no rashes. He's had no swollen lymph nodes. He's had no change in bowel or bladder habits.  As always, he and his family will be off for another summer vacation. This time, they are going to [place].  He's had no issues with his heart. There's been no palpitations. He's had no shortness of breath.  He's had no leg swelling.  Medications: Current outpatient prescriptions:aspirin EC 81 MG tablet, Take 81 mg by mouth daily., Disp: , Rfl: ;  brimonidine (ALPHAGAN) 0.2 % ophthalmic solution, Place 1 drop into the left eye 2 (two) times daily. , Disp: , Rfl: ;  Difluprednate (DUREZOL) 0.05 % EMUL, Place 1 drop into the right eye daily as needed (for affected eye)., Disp: , Rfl:  metoprolol succinate (TOPROL-XL) 25 MG 24 hr tablet, Take 0.5 tablets (12.5 mg total) by mouth daily., Disp: 90 tablet, Rfl: 1;  timolol (TIMOPTIC) 0.5 % ophthalmic solution, Place 1 drop into the left eye daily. Left eye, Disp: , Rfl:   Allergies:  Allergies  Allergen Reactions  . Other Anaphylaxis    Shellfish  . Shellfish-Derived Products Anaphylaxis  . Adhesive [Tape] Other (See Comments)    REDNESS  . Latex Rash    Past Medical History, Surgical history, Social history, and Family History were reviewed and updated.  Review of Systems: As above  Physical Exam:  height is 5\' 11"  (1.803 m) and weight is 163 lb (73.936 kg). His oral temperature is 98.4 F (36.9 C). His blood pressure is 106/62 and his pulse is  64. His respiration is 18.   Lungs are clear. Cardiac exam regular rate and rhythm. No murmurs rubs or bruits. Lymph node exam shows no lymphadenopathy. Abdomen is soft. Has good bowel sounds. There is no fluid wave. There is no palpable liver or spleen tip. Exam no tenderness over the spine ribs or hips. Extremities shows no clubbing cyanosis or edema. Neurological exam shows no focal neurological deficits. Skin exam no rashes.  Lab Results  Component Value Date   WBC 23.6* 01/19/2014   HGB 15.1 01/19/2014   HCT 46.7 01/19/2014   MCV 98 01/19/2014   PLT 94* 01/19/2014     Chemistry      Component Value Date/Time   NA 142 06/26/2013 1359   K 5.5* 06/26/2013 1359   CL 105 06/26/2013 1359   CO2 30 06/26/2013 1359   BUN 16 06/26/2013 1359   CREATININE 0.90 06/26/2013 1359      Component Value Date/Time   CALCIUM 9.6 06/26/2013 1359   ALKPHOS 59 06/15/2013 0805   AST 15 06/15/2013 0805   ALT 11 06/15/2013 0805   BILITOT 0.9 06/15/2013 0805         Impression and Plan: Lucas [name] is 73 year old abdomen. He has CLL. We have not treated this. We have been following him now for over 10 years. His white cell count has gone I will do better. He is chronically thrombocytopenic but this is asymptomatic and not  getting worse.  I think we can probably get him back in 6 months.  He always knows that we can get him back sooner if he has any symptoms.   Volanda Napoleon, MD 6/5/201511:42 AM

## 2014-01-24 ENCOUNTER — Other Ambulatory Visit: Payer: Medicare Other | Admitting: Lab

## 2014-01-24 ENCOUNTER — Ambulatory Visit: Payer: Medicare Other | Admitting: Hematology & Oncology

## 2014-02-06 ENCOUNTER — Other Ambulatory Visit: Payer: Self-pay | Admitting: Dermatology

## 2014-02-06 DIAGNOSIS — Z808 Family history of malignant neoplasm of other organs or systems: Secondary | ICD-10-CM | POA: Diagnosis not present

## 2014-02-06 DIAGNOSIS — L570 Actinic keratosis: Secondary | ICD-10-CM | POA: Diagnosis not present

## 2014-02-06 DIAGNOSIS — Z85828 Personal history of other malignant neoplasm of skin: Secondary | ICD-10-CM | POA: Diagnosis not present

## 2014-02-06 DIAGNOSIS — D485 Neoplasm of uncertain behavior of skin: Secondary | ICD-10-CM | POA: Diagnosis not present

## 2014-03-12 DIAGNOSIS — C4401 Basal cell carcinoma of skin of lip: Secondary | ICD-10-CM | POA: Diagnosis not present

## 2014-03-14 DIAGNOSIS — Z125 Encounter for screening for malignant neoplasm of prostate: Secondary | ICD-10-CM | POA: Diagnosis not present

## 2014-03-14 DIAGNOSIS — E785 Hyperlipidemia, unspecified: Secondary | ICD-10-CM | POA: Diagnosis not present

## 2014-03-14 DIAGNOSIS — E119 Type 2 diabetes mellitus without complications: Secondary | ICD-10-CM | POA: Diagnosis not present

## 2014-03-21 DIAGNOSIS — E785 Hyperlipidemia, unspecified: Secondary | ICD-10-CM | POA: Diagnosis not present

## 2014-03-21 DIAGNOSIS — Z23 Encounter for immunization: Secondary | ICD-10-CM | POA: Diagnosis not present

## 2014-03-21 DIAGNOSIS — E119 Type 2 diabetes mellitus without complications: Secondary | ICD-10-CM | POA: Diagnosis not present

## 2014-03-21 DIAGNOSIS — Z125 Encounter for screening for malignant neoplasm of prostate: Secondary | ICD-10-CM | POA: Diagnosis not present

## 2014-03-21 DIAGNOSIS — Z0000 Encounter for general adult medical examination without abnormal findings: Secondary | ICD-10-CM | POA: Diagnosis not present

## 2014-03-21 DIAGNOSIS — IMO0002 Reserved for concepts with insufficient information to code with codable children: Secondary | ICD-10-CM | POA: Diagnosis not present

## 2014-03-21 DIAGNOSIS — C9110 Chronic lymphocytic leukemia of B-cell type not having achieved remission: Secondary | ICD-10-CM | POA: Diagnosis not present

## 2014-03-21 DIAGNOSIS — H3090 Unspecified chorioretinal inflammation, unspecified eye: Secondary | ICD-10-CM | POA: Diagnosis not present

## 2014-03-22 DIAGNOSIS — Z1212 Encounter for screening for malignant neoplasm of rectum: Secondary | ICD-10-CM | POA: Diagnosis not present

## 2014-05-10 DIAGNOSIS — Z23 Encounter for immunization: Secondary | ICD-10-CM | POA: Diagnosis not present

## 2014-05-16 DIAGNOSIS — H3090 Unspecified chorioretinal inflammation, unspecified eye: Secondary | ICD-10-CM | POA: Diagnosis not present

## 2014-05-16 DIAGNOSIS — H40049 Steroid responder, unspecified eye: Secondary | ICD-10-CM | POA: Diagnosis not present

## 2014-05-16 DIAGNOSIS — H409 Unspecified glaucoma: Secondary | ICD-10-CM | POA: Diagnosis not present

## 2014-06-28 ENCOUNTER — Encounter: Payer: Self-pay | Admitting: *Deleted

## 2014-06-28 ENCOUNTER — Ambulatory Visit (INDEPENDENT_AMBULATORY_CARE_PROVIDER_SITE_OTHER): Payer: Medicare Other | Admitting: *Deleted

## 2014-06-28 ENCOUNTER — Telehealth: Payer: Self-pay | Admitting: Cardiovascular Disease

## 2014-06-28 VITALS — BP 100/60 | HR 79 | Ht 72.0 in | Wt 169.3 lb

## 2014-06-28 DIAGNOSIS — I4891 Unspecified atrial fibrillation: Secondary | ICD-10-CM

## 2014-06-28 NOTE — Progress Notes (Signed)
Patient arrived for EKG. Patient called earlier today states he felt dizzy and heartrate is irregular. Patient states he thinks he is atrial fib. EKG obtained by RN.  RN reviewed with Dr Martinique. Dr Martinique interpret the EKG- AFIB  Per Dr Martinique, patient is rate control and has [place] score 1. Continue with current medications and follow up with Dr Gwenlyn Found and/or extender. Appointment made for 07/03/14 at 9:15  RN informed patient. Patient states he is going out of town on Sunday.  RN change appointment to 06/29/14,9:15 am.

## 2014-06-28 NOTE — Telephone Encounter (Signed)
Spoke to patient.  patient he work today, irregular beat and dizziness. Patient states has a bout  X 1since last 1 year Blood pressure 127/80 pulse 80 ; 108/60 pulse 82, no other symptoms. No irregular heart beat yesterday patient states he played golf with no problems. RN asked patient to come to office for an EKG to be evaluated by a doctor. Patient states he is on his way.

## 2014-06-28 NOTE — Patient Instructions (Signed)
No change with medications  follow up appointment with Tarri Fuller tomorrow 9:15 am Patient aware.

## 2014-06-28 NOTE — Telephone Encounter (Signed)
Lucas [name] is calling because he has had an irregular heartbeat this morning and has a history of AFIB , wants to know should he come in for a EKG . Please Call   Thanks

## 2014-06-29 ENCOUNTER — Encounter: Payer: Self-pay | Admitting: Physician Assistant

## 2014-06-29 ENCOUNTER — Ambulatory Visit (INDEPENDENT_AMBULATORY_CARE_PROVIDER_SITE_OTHER): Payer: Medicare Other | Admitting: Physician Assistant

## 2014-06-29 VITALS — BP 112/62 | HR 68 | Ht 72.0 in | Wt 172.2 lb

## 2014-06-29 DIAGNOSIS — I480 Paroxysmal atrial fibrillation: Secondary | ICD-10-CM

## 2014-06-29 NOTE — Assessment & Plan Note (Signed)
Patient was seen yesterday for an EKG showed an atrial fibrillation. By 5:00 that afternoon he converted back to sinus rhythm. He did take an extra aspirin but did not take an extra metoprolol succ. We did discuss doing that he has a prolonged episode.  He will follow-up with Dr. Gwenlyn Found in December for his annual appointment.

## 2014-06-29 NOTE — Patient Instructions (Signed)
Your physician recommends that you schedule a follow-up appointment in: December with Dr Gwenlyn Found

## 2014-06-29 NOTE — Progress Notes (Signed)
Date:  06/29/2014   ID:  Lucas [name], DOB 03-10-1941, MRN [id number]  PCP:  Geoffery Lyons, MD  Primary Cardiologist:  Gwenlyn Found    History of Present Illness: MOUSTAPHA TOOKER is a 73 y.o. fit-appearing, married Caucasian male, father to 75 stepchild, grandfather to 2 step-grandchildren. He had diagnostic cath performed by Dr. Gwenlyn Found in January after a positive Myoview that revealed normal coronary arteries, normal LV function suggesting a false positive test. He has PAF with RVR in the past with atypical chest pain. He also has CLL which is quiescent followed by Dr. [name]. He did have a 3-week photo shoot safari in [place] in the [place] after Dr. Gwenlyn Found cath'd him and has taken 2000 pictures. He is otherwise active and asymptomatic. His most recent lab work performed in January revealed total cholesterol 168, LDL 107, HDL 44. He was admitted to [facility] ER on 06/27/13 by Dr. [name] for A. Fib. Plans were to convert him , however he converted on his own. He was seen in the [facility] ER by Dr. [name].   Patient came in yesterday from 80 and had an EKG done which showed atrial fibrillation with a rate of 79 bpm. I was at 1308 hrs. And by 1700 hrs. The patient had converted spontaneously. He did not take an extra metoprolol which was previously discussed. He gets a little lightheaded and dizzy but otherwise denies nausea, vomiting, fever, chest pain, shortness of breath, orthopnea, PND, cough, congestion, abdominal pain, hematochezia, melena, lower extremity edema, claudication.  Lipid Panel     Component Value Date/Time   CHOL 168 09/10/2011 0420   TRIG 84 09/10/2011 0420   HDL 44 09/10/2011 0420   CHOLHDL 3.8 09/10/2011 0420   VLDL 17 09/10/2011 0420   LDLCALC 107* 09/10/2011 0420     Wt Readings from Last 3 Encounters:  06/29/14 172 lb 3.2 oz (78.109 kg)  06/28/14 169 lb 4.8 oz (76.794 kg)  01/19/14 163 lb (73.936 kg)     Past Medical History  Diagnosis Date  . CLL  (chronic lymphoblastic leukemia) 09/03/2011    dx 1999  . Diabetes mellitus,borderline 09/10/2011  . Atrial fibrillation     Current Outpatient Prescriptions  Medication Sig Dispense Refill  . aspirin EC 81 MG tablet Take 81 mg by mouth daily.    . brimonidine (ALPHAGAN) 0.2 % ophthalmic solution Place 1 drop into the left eye 2 (two) times daily.     . Difluprednate (DUREZOL) 0.05 % EMUL Place 1 drop into the right eye daily as needed (for affected eye).    . metoprolol succinate (TOPROL-XL) 25 MG 24 hr tablet Take 0.5 tablets (12.5 mg total) by mouth daily. 90 tablet 1  . timolol (TIMOPTIC) 0.5 % ophthalmic solution Place 1 drop into the left eye daily. Left eye     No current facility-administered medications for this visit.    Allergies:    Allergies  Allergen Reactions  . Other Anaphylaxis    Shellfish  . Shellfish-Derived Products Anaphylaxis  . Adhesive [Tape] Other (See Comments)    REDNESS  . Latex Rash    Social History:  The patient  reports that he quit smoking about 48 years ago. His smoking use included Cigarettes. He started smoking about 51 years ago. He has a 3 pack-year smoking history. He has never used smokeless tobacco. He reports that he drinks about 2.4 oz of alcohol per week. He reports that he does not use illicit drugs.  Family history:   Family History  Problem Relation Age of Onset  . Cancer Mother     Multiple myeloma  . Colon cancer Mother   . Heart attack Father     Died age 4  . Stroke Father   . Cancer Sister     Breast cancer  . Heart attack Paternal Grandmother   . Cancer Paternal Grandmother     Breast cancer  . Stroke Paternal Grandfather     ROS:  Please see the history of present illness.  All other systems reviewed and negative.   PHYSICAL EXAM: VS:  BP 112/62 mmHg  Pulse 68  Ht 6' (1.829 m)  Wt 172 lb 3.2 oz (78.109 kg)  BMI 23.35 kg/m2 Well nourished, well developed, in no acute distress HEENT: Pupils are equal round  react to light accommodation extraocular movements are intact.  Neck: no JVDNo cervical lymphadenopathy. Cardiac: Regular rate and rhythm without murmurs rubs or gallops. Lungs:  clear to auscultation bilaterally, no wheezing, rhonchi or rales Ext: no lower extremity edema.  2+ radial and dorsalis pedis pulses. Skin: warm and dry Neuro:  Grossly normal  EKG:  Vital sinus rhythm rate 68 bpm    ASSESSMENT AND PLAN:  Problem List Items Addressed This Visit    Paroxysmal atrial fibrillation    Patient was seen yesterday for an EKG showed an atrial fibrillation. By 5:00 that afternoon he converted back to sinus rhythm. He did take an extra aspirin but did not take an extra metoprolol succ. We did discuss doing that he has a prolonged episode.  He will follow-up with Dr. Gwenlyn Found in December for his annual appointment.     Other Visit Diagnoses    PAF (paroxysmal atrial fibrillation)    -  Primary    Relevant Orders       EKG 12-Lead

## 2014-07-19 ENCOUNTER — Encounter: Payer: Self-pay | Admitting: Family

## 2014-07-19 ENCOUNTER — Ambulatory Visit (HOSPITAL_BASED_OUTPATIENT_CLINIC_OR_DEPARTMENT_OTHER): Payer: Medicare Other | Admitting: Family

## 2014-07-19 ENCOUNTER — Other Ambulatory Visit (HOSPITAL_BASED_OUTPATIENT_CLINIC_OR_DEPARTMENT_OTHER): Payer: Medicare Other | Admitting: Lab

## 2014-07-19 DIAGNOSIS — IMO0001 Reserved for inherently not codable concepts without codable children: Secondary | ICD-10-CM

## 2014-07-19 DIAGNOSIS — C9100 Acute lymphoblastic leukemia not having achieved remission: Secondary | ICD-10-CM

## 2014-07-19 LAB — CBC WITH DIFFERENTIAL (CANCER CENTER ONLY)
BASO#: 0.1 10*3/uL (ref 0.0–0.2)
BASO%: 0.1 % (ref 0.0–2.0)
EOS%: 0 % (ref 0.0–7.0)
Eosinophils Absolute: 0 10*3/uL (ref 0.0–0.5)
HCT: 46.6 % (ref 38.7–49.9)
HEMOGLOBIN: 15 g/dL (ref 13.0–17.1)
LYMPH#: 30.9 10*3/uL — AB (ref 0.9–3.3)
LYMPH%: 91.3 % — ABNORMAL HIGH (ref 14.0–48.0)
MCH: 31.5 pg (ref 28.0–33.4)
MCHC: 32.2 g/dL (ref 32.0–35.9)
MCV: 98 fL (ref 82–98)
MONO#: 0.4 10*3/uL (ref 0.1–0.9)
MONO%: 1.3 % (ref 0.0–13.0)
NEUT%: 7.3 % — ABNORMAL LOW (ref 40.0–80.0)
NEUTROS ABS: 2.5 10*3/uL (ref 1.5–6.5)
PLATELETS: 104 10*3/uL — AB (ref 145–400)
RBC: 4.76 10*6/uL (ref 4.20–5.70)
RDW: 14.6 % (ref 11.1–15.7)
WBC: 33.9 10*3/uL — ABNORMAL HIGH (ref 4.0–10.0)

## 2014-07-19 LAB — TECHNOLOGIST REVIEW CHCC SATELLITE

## 2014-07-19 LAB — CHCC SATELLITE - SMEAR

## 2014-07-19 NOTE — Progress Notes (Signed)
[facility]  [phone] [phone]) [phone]  ID: Lucas [name] OB: 1940-09-12 MR#: [id number]#:[id number] Patient Care Team: Geoffery Lyons, MD as PCP - General (Internal Medicine)  DIAGNOSIS: 1. Chronic lymphocytic leukemia - stage C. 2. Chorioretinitis of the right eye. 3. Paroxysmal atrial fibrillation.  INTERVAL HISTORY: Lucas [name] is here today for a follow-up. He is doing well and staying active. He started playing pickle ball a few months ago and is really enjoying himself.  He has had no issues with infection. His WBC is 33.9. He denies fever, chills, n/v, cough, rash, headache, dizziness, lymphedema, SOB, chest pain, palpitations, abdominal pain, constipation, diarrhea, blood in urine or stool.  No bleeding or pain.  No swelling, tenderness, numbness or tingling in his extremities.  His appetite is good and he is drinking plenty of fluids. His weight is stable.   CURRENT TREATMENT: Observation  REVIEW OF SYSTEMS: All other 10 point review of systems is negative.   PAST MEDICAL HISTORY: Past Medical History  Diagnosis Date  . CLL (chronic lymphoblastic leukemia) 09/03/2011    dx 1999  . Diabetes mellitus,borderline 09/10/2011  . Atrial fibrillation     PAST SURGICAL HISTORY: Past Surgical History  Procedure Laterality Date  . Rotator cuff repair  1999  . Cataract extraction  2012  . Tonsillectomy    . Cyst removal trunk  2010    back  . Cardiac catheterization  09/17/2011    Normal coronary arteries and LV function  . Cardiovascular stress test  09/15/2011    Mild-moderate perfusion defect due to infarct/scar w/ moderate perinfarct ischemia seen in basal inferoseptal, basal inferior, mid inferoseptal, mid inferior and apical inferior regions. Observed defect may in part be attributable to diagphragmatic attenuation, however there is also reversibility that would suggest a true defect. EKG negative for ischemia.  . Transthoracic  echocardiogram  09/15/2011    EF >55%, Normal LV systolic function    FAMILY HISTORY Family History  Problem Relation Age of Onset  . Cancer Mother     Multiple myeloma  . Colon cancer Mother   . Heart attack Father     Died age 3  . Stroke Father   . Cancer Sister     Breast cancer  . Heart attack Paternal Grandmother   . Cancer Paternal Grandmother     Breast cancer  . Stroke Paternal Grandfather     GYNECOLOGIC HISTORY:  No LMP for male patient.   SOCIAL HISTORY: History   Social History  . Marital Status: Married    Spouse Name: N/A    Number of Children: 1  . Years of Education: N/A   Occupational History  .     Social History Main Topics  . Smoking status: Former Smoker -- 1.00 packs/day for 3 years    Types: Cigarettes    Start date: 09/19/1962    Quit date: 12/17/1965  . Smokeless tobacco: Never Used     Comment: quit 47 yeras ago  . Alcohol Use: 2.4 oz/week    4 Glasses of wine per week  . Drug Use: No  . Sexual Activity: Not on file   Other Topics Concern  . Not on file   Social History Narrative   Lives with wife.      ADVANCED DIRECTIVES:  <no information>  HEALTH MAINTENANCE: History  Substance Use Topics  . Smoking status: Former Smoker -- 1.00 packs/day for 3 years    Types: Cigarettes    Start  date: 09/19/1962    Quit date: 12/17/1965  . Smokeless tobacco: Never Used     Comment: quit 47 yeras ago  . Alcohol Use: 2.4 oz/week    4 Glasses of wine per week   Colonoscopy: PAP: Bone density: Lipid panel:  Allergies  Allergen Reactions  . Other Anaphylaxis    Shellfish  . Shellfish-Derived Products Anaphylaxis  . Adhesive [Tape] Other (See Comments)    REDNESS  . Latex Rash    Current Outpatient Prescriptions  Medication Sig Dispense Refill  . aspirin EC 81 MG tablet Take 81 mg by mouth daily.    . brimonidine (ALPHAGAN) 0.2 % ophthalmic solution Place 1 drop into the left eye 2 (two) times daily.     . Difluprednate  (DUREZOL) 0.05 % EMUL Place 1 drop into the right eye daily as needed (for affected eye).    . metoprolol succinate (TOPROL-XL) 25 MG 24 hr tablet Take 0.5 tablets (12.5 mg total) by mouth daily. 90 tablet 1  . timolol (TIMOPTIC) 0.5 % ophthalmic solution Place 1 drop into the left eye daily. Left eye     No current facility-administered medications for this visit.    OBJECTIVE: Filed Vitals:   07/19/14 0909  BP: 115/68  Pulse: 64  Temp: 98.1 F (36.7 C)  Resp: 16    Filed Weights   07/19/14 0909  Weight: 166 lb (75.297 kg)   ECOG FS:0 - Asymptomatic Ocular: Sclerae unicteric, pupils equal, round and reactive to light Ear-nose-throat: Oropharynx clear, dentition fair Lymphatic: No cervical or supraclavicular adenopathy Lungs no rales or rhonchi, good excursion bilaterally Heart regular rate and rhythm, no murmur appreciated Abd soft, nontender, positive bowel sounds MSK no focal spinal tenderness, no joint edema Neuro: non-focal, well-oriented, appropriate affect  LAB RESULTS: CMP     Component Value Date/Time   NA 142 06/26/2013 1359   K 5.5* 06/26/2013 1359   CL 105 06/26/2013 1359   CO2 30 06/26/2013 1359   GLUCOSE 104* 06/26/2013 1359   BUN 16 06/26/2013 1359   CREATININE 0.90 06/26/2013 1359   CALCIUM 9.6 06/26/2013 1359   PROT 6.5 06/15/2013 0805   ALBUMIN 4.1 06/15/2013 0805   AST 15 06/15/2013 0805   ALT 11 06/15/2013 0805   ALKPHOS 59 06/15/2013 0805   BILITOT 0.9 06/15/2013 0805   GFRNONAA 83* 06/26/2013 1359   GFRAA >90 06/26/2013 1359   INo results found for: SPEP, UPEP Lab Results  Component Value Date   WBC 33.9* 07/19/2014   NEUTROABS 2.5 07/19/2014   HGB 15.0 07/19/2014   HCT 46.6 07/19/2014   MCV 98 07/19/2014   PLT 104* 07/19/2014   No results found for: LABCA2 No components found for: CXKGY185 No results for input(s): INR in the last 168 hours. Urinalysis No results found for: COLORURINE, APPEARANCEUR, LABSPEC, PHURINE, GLUCOSEU,  HGBUR, BILIRUBINUR, KETONESUR, PROTEINUR, UROBILINOGEN, NITRITE, LEUKOCYTESUR STUDIES: No results found.  ASSESSMENT/PLAN: Lucas [name] is 73 year old male with CLL. We have not treated this but have been following him now for over 10 years. His WBC 33.9 and platelets are up to 104.  He is completely asymptomatic at this time. We will see him back in 6 months for labs and follow-up.  He knows to call here with any questions or concerns and to go to the ED in the event of an emergency. We can certainly see him sooner if need be.   Eliezer Bottom, NP 07/19/2014 9:24 AM

## 2014-07-26 ENCOUNTER — Encounter (HOSPITAL_COMMUNITY): Payer: Self-pay | Admitting: Cardiovascular Disease

## 2014-08-21 ENCOUNTER — Telehealth: Payer: Self-pay | Admitting: Cardiovascular Disease

## 2014-08-21 NOTE — Telephone Encounter (Signed)
Closed encounter °

## 2014-08-24 ENCOUNTER — Ambulatory Visit (INDEPENDENT_AMBULATORY_CARE_PROVIDER_SITE_OTHER): Payer: Medicare Other | Admitting: Cardiovascular Disease

## 2014-08-24 ENCOUNTER — Encounter: Payer: Self-pay | Admitting: Hematology & Oncology

## 2014-08-24 ENCOUNTER — Encounter: Payer: Self-pay | Admitting: Cardiovascular Disease

## 2014-08-24 VITALS — BP 90/58 | HR 72 | Ht 72.0 in | Wt 165.0 lb

## 2014-08-24 DIAGNOSIS — I480 Paroxysmal atrial fibrillation: Secondary | ICD-10-CM | POA: Diagnosis not present

## 2014-08-24 MED ORDER — SILDENAFIL CITRATE 50 MG PO TABS: 50.0000 mg | ORAL_TABLET | Freq: Every day | ORAL | Status: DC | PRN

## 2014-08-24 NOTE — Patient Instructions (Signed)
Your physician wants you to follow-up in: 1 year with Dr Berry. You will receive a reminder letter in the mail two months in advance. If you don't receive a letter, please call our office to schedule the follow-up appointment.  

## 2014-08-24 NOTE — Progress Notes (Signed)
08/24/2014 Lucas [name]   Mar 24, 1941  [id number]  Primary Physician [name],Lucas A, MD Primary Cardiologist: Lucas Harp MD Lucas [name]   HPI:  The patient is a very pleasant 74 year old, thin and fit-appearing, married Caucasian male, father to 94 stepchild, grandfather to 2 step-grandchildren who I last saw in the office 12 months ago. He had diagnostic cath performed by me in January after a positive Myoview that revealed normal coronary arteries, normal LV function suggesting a false positive test. He has PAF with RVR in the past with atypical chest pain. He also has CLL which is quiescent followed by Dr. Burney [name]. He did have a 3-week photo shoot safari in [place] in the [place] after I cath'd him and has taken [employer]. He is otherwise active and asymptomatic. His most recent lab work performed in January revealed total cholesterol 168, LDL 107, HDL 44. He was admitted to [facility] ER on 06/27/13 by Dr. Percival [name] for A. Fib. Plans were to convert him with oral flecainide however he converted on his own. He was seen in the [facility] ER by Dr. Sallyanne [name]. Since I saw him in the office one year ago he has had one episode of PAF on November 12 of less or lasting for hours, associated with dizziness, and spontaneously converting.  Current Outpatient Prescriptions  Medication Sig Dispense Refill  . aspirin EC 81 MG tablet Take 81 mg by mouth daily.    . brimonidine (ALPHAGAN) 0.2 % ophthalmic solution Place 1 drop into the left eye 2 (two) times daily.     . Difluprednate (DUREZOL) 0.05 % EMUL Place 1 drop into the right eye daily as needed (for affected eye).    . metoprolol succinate (TOPROL-XL) 25 MG 24 hr tablet Take 0.5 tablets (12.5 mg total) by mouth daily. 90 tablet 1  . timolol (TIMOPTIC) 0.5 % ophthalmic solution Place 1 drop into the left eye daily. Left eye    . sildenafil (VIAGRA) 50 MG tablet Take 1 tablet (50 mg total) by mouth daily as needed for erectile  dysfunction. 10 tablet 1   No current facility-administered medications for this visit.    Allergies  Allergen Reactions  . Other Anaphylaxis    Shellfish  . Shellfish-Derived Products Anaphylaxis  . Adhesive [Tape] Other (See Comments)    REDNESS  . Latex Rash    History   Social History  . Marital Status: Married    Spouse Name: N/A    Number of Children: 1  . Years of Education: N/A   Occupational History  .     Social History Main Topics  . Smoking status: Former Smoker -- 1.00 packs/day for 3 years    Types: Cigarettes    Start date: 09/19/1962    Quit date: 12/17/1965  . Smokeless tobacco: Never Used     Comment: quit 47 yeras ago  . Alcohol Use: 2.4 oz/week    4 Glasses of wine per week  . Drug Use: No  . Sexual Activity: Not on file   Other Topics Concern  . Not on file   Social History Narrative   Lives with wife.       Review of Systems: General: negative for chills, fever, night sweats or weight changes.  Cardiovascular: negative for chest pain, dyspnea on exertion, edema, orthopnea, palpitations, paroxysmal nocturnal dyspnea or shortness of breath Dermatological: negative for rash Respiratory: negative for cough or wheezing Urologic: negative for hematuria Abdominal: negative for nausea, vomiting, diarrhea, bright  red blood per rectum, melena, or hematemesis Neurologic: negative for visual changes, syncope, or dizziness All other systems reviewed and are otherwise negative except as noted above.    Blood pressure 90/58, pulse 72, height 6' (1.829 m), weight 165 lb (74.844 kg).  General appearance: alert and no distress Neck: no adenopathy, no carotid bruit, no JVD, supple, symmetrical, trachea midline and thyroid not enlarged, symmetric, no tenderness/mass/nodules Lungs: clear to auscultation bilaterally Heart: regular rate and rhythm, S1, S2 normal, no murmur, click, rub or gallop Extremities: extremities normal, atraumatic, no cyanosis or  edema  EKG normal sinus rhythm at 72 with a ST or T-wave changes. I personally reviewed the EKG  ASSESSMENT AND PLAN:   Paroxysmal atrial fibrillation History of paroxysmal atrial fibrillation. He gets positive one episode a year lasting 4-6 hours a time associated with dizziness. His last episode was in November of last year. He does have a normal 2-D echo. I performed cardiac catheterization on him after an abnormal Myoview revealing normal coronary arteries and normal LV function. He is only on aspirin. He takes low-dose blocker.I will see him back in one year      Lucas Harp MD [facility], [facility] 08/24/2014 8:52 AM

## 2014-08-24 NOTE — Assessment & Plan Note (Signed)
History of paroxysmal atrial fibrillation. He gets positive one episode a year lasting 4-6 hours a time associated with dizziness. His last episode was in November of last year. He does have a normal 2-D echo. I performed cardiac catheterization on him after an abnormal Myoview revealing normal coronary arteries and normal LV function. He is only on aspirin. He takes low-dose blocker.I will see him back in one year

## 2014-09-03 ENCOUNTER — Encounter: Payer: Self-pay | Admitting: Cardiovascular Disease

## 2014-09-03 MED ORDER — SILDENAFIL CITRATE 50 MG PO TABS: 50.0000 mg | ORAL_TABLET | Freq: Every day | ORAL | Status: DC | PRN

## 2014-09-03 NOTE — Telephone Encounter (Signed)
RX for Viagra printed per patient request and sent to patient.

## 2014-09-04 ENCOUNTER — Other Ambulatory Visit: Payer: Self-pay | Admitting: Dermatology

## 2014-09-04 DIAGNOSIS — B356 Tinea cruris: Secondary | ICD-10-CM | POA: Diagnosis not present

## 2014-09-04 DIAGNOSIS — C44319 Basal cell carcinoma of skin of other parts of face: Secondary | ICD-10-CM | POA: Diagnosis not present

## 2014-09-04 DIAGNOSIS — L570 Actinic keratosis: Secondary | ICD-10-CM | POA: Diagnosis not present

## 2014-09-04 DIAGNOSIS — Z808 Family history of malignant neoplasm of other organs or systems: Secondary | ICD-10-CM | POA: Diagnosis not present

## 2014-09-04 DIAGNOSIS — L821 Other seborrheic keratosis: Secondary | ICD-10-CM | POA: Diagnosis not present

## 2014-09-04 DIAGNOSIS — C4442 Squamous cell carcinoma of skin of scalp and neck: Secondary | ICD-10-CM | POA: Diagnosis not present

## 2014-09-04 DIAGNOSIS — D485 Neoplasm of uncertain behavior of skin: Secondary | ICD-10-CM | POA: Diagnosis not present

## 2014-09-04 DIAGNOSIS — C44519 Basal cell carcinoma of skin of other part of trunk: Secondary | ICD-10-CM | POA: Diagnosis not present

## 2014-09-04 DIAGNOSIS — Z85828 Personal history of other malignant neoplasm of skin: Secondary | ICD-10-CM | POA: Diagnosis not present

## 2014-09-04 DIAGNOSIS — D2261 Melanocytic nevi of right upper limb, including shoulder: Secondary | ICD-10-CM | POA: Diagnosis not present

## 2014-09-17 DIAGNOSIS — L570 Actinic keratosis: Secondary | ICD-10-CM | POA: Diagnosis not present

## 2014-09-26 DIAGNOSIS — Z1389 Encounter for screening for other disorder: Secondary | ICD-10-CM | POA: Diagnosis not present

## 2014-09-26 DIAGNOSIS — H3093 Unspecified chorioretinal inflammation, bilateral: Secondary | ICD-10-CM | POA: Diagnosis not present

## 2014-09-26 DIAGNOSIS — E785 Hyperlipidemia, unspecified: Secondary | ICD-10-CM | POA: Diagnosis not present

## 2014-09-26 DIAGNOSIS — Z6822 Body mass index (BMI) 22.0-22.9, adult: Secondary | ICD-10-CM | POA: Diagnosis not present

## 2014-09-26 DIAGNOSIS — E119 Type 2 diabetes mellitus without complications: Secondary | ICD-10-CM | POA: Diagnosis not present

## 2014-09-26 DIAGNOSIS — N529 Male erectile dysfunction, unspecified: Secondary | ICD-10-CM | POA: Diagnosis not present

## 2014-09-26 DIAGNOSIS — I480 Paroxysmal atrial fibrillation: Secondary | ICD-10-CM | POA: Diagnosis not present

## 2014-10-01 ENCOUNTER — Telehealth: Payer: Self-pay | Admitting: *Deleted

## 2014-10-01 ENCOUNTER — Encounter: Payer: Self-pay | Admitting: Cardiovascular Disease

## 2014-10-01 MED ORDER — METOPROLOL SUCCINATE ER 25 MG PO TB24: 12.5000 mg | ORAL_TABLET | Freq: Every day | ORAL | Status: DC

## 2014-10-01 NOTE — Telephone Encounter (Signed)
E-scribed Metoprolol to [place]

## 2014-10-03 DIAGNOSIS — L570 Actinic keratosis: Secondary | ICD-10-CM | POA: Diagnosis not present

## 2014-10-03 DIAGNOSIS — L905 Scar conditions and fibrosis of skin: Secondary | ICD-10-CM | POA: Diagnosis not present

## 2014-10-03 DIAGNOSIS — D485 Neoplasm of uncertain behavior of skin: Secondary | ICD-10-CM | POA: Diagnosis not present

## 2014-10-03 DIAGNOSIS — C44329 Squamous cell carcinoma of skin of other parts of face: Secondary | ICD-10-CM | POA: Diagnosis not present

## 2014-10-03 DIAGNOSIS — C4441 Basal cell carcinoma of skin of scalp and neck: Secondary | ICD-10-CM | POA: Diagnosis not present

## 2014-10-03 DIAGNOSIS — C44319 Basal cell carcinoma of skin of other parts of face: Secondary | ICD-10-CM | POA: Diagnosis not present

## 2014-10-03 DIAGNOSIS — D2339 Other benign neoplasm of skin of other parts of face: Secondary | ICD-10-CM | POA: Diagnosis not present

## 2014-10-06 DIAGNOSIS — M25562 Pain in left knee: Secondary | ICD-10-CM | POA: Diagnosis not present

## 2014-10-10 DIAGNOSIS — C44519 Basal cell carcinoma of skin of other part of trunk: Secondary | ICD-10-CM | POA: Diagnosis not present

## 2014-11-20 DIAGNOSIS — L570 Actinic keratosis: Secondary | ICD-10-CM | POA: Diagnosis not present

## 2014-11-23 DIAGNOSIS — C44319 Basal cell carcinoma of skin of other parts of face: Secondary | ICD-10-CM | POA: Diagnosis not present

## 2014-11-29 DIAGNOSIS — H4011X2 Primary open-angle glaucoma, moderate stage: Secondary | ICD-10-CM | POA: Diagnosis not present

## 2014-12-17 DIAGNOSIS — R05 Cough: Secondary | ICD-10-CM | POA: Diagnosis not present

## 2014-12-17 DIAGNOSIS — J069 Acute upper respiratory infection, unspecified: Secondary | ICD-10-CM | POA: Diagnosis not present

## 2014-12-17 DIAGNOSIS — Z6822 Body mass index (BMI) 22.0-22.9, adult: Secondary | ICD-10-CM | POA: Diagnosis not present

## 2015-01-10 DIAGNOSIS — H2512 Age-related nuclear cataract, left eye: Secondary | ICD-10-CM | POA: Diagnosis not present

## 2015-01-10 DIAGNOSIS — H35371 Puckering of macula, right eye: Secondary | ICD-10-CM | POA: Diagnosis not present

## 2015-01-10 DIAGNOSIS — H30021 Focal chorioretinal inflammation of posterior pole, right eye: Secondary | ICD-10-CM | POA: Diagnosis not present

## 2015-01-10 DIAGNOSIS — H4011X2 Primary open-angle glaucoma, moderate stage: Secondary | ICD-10-CM | POA: Diagnosis not present

## 2015-01-17 ENCOUNTER — Other Ambulatory Visit (HOSPITAL_BASED_OUTPATIENT_CLINIC_OR_DEPARTMENT_OTHER): Payer: Medicare Other

## 2015-01-17 ENCOUNTER — Ambulatory Visit (HOSPITAL_BASED_OUTPATIENT_CLINIC_OR_DEPARTMENT_OTHER): Payer: Medicare Other | Admitting: Family

## 2015-01-17 ENCOUNTER — Encounter: Payer: Self-pay | Admitting: Family

## 2015-01-17 VITALS — BP 127/71 | HR 84 | Temp 97.9°F | Resp 18 | Ht 72.0 in | Wt 162.0 lb

## 2015-01-17 DIAGNOSIS — C9110 Chronic lymphocytic leukemia of B-cell type not having achieved remission: Secondary | ICD-10-CM | POA: Diagnosis present

## 2015-01-17 DIAGNOSIS — C9100 Acute lymphoblastic leukemia not having achieved remission: Secondary | ICD-10-CM

## 2015-01-17 DIAGNOSIS — IMO0001 Reserved for inherently not codable concepts without codable children: Secondary | ICD-10-CM

## 2015-01-17 LAB — CBC WITH DIFFERENTIAL (CANCER CENTER ONLY)
HEMATOCRIT: 46.6 % (ref 38.7–49.9)
HGB: 15 g/dL (ref 13.0–17.1)
MCH: 32 pg (ref 28.0–33.4)
MCHC: 32.2 g/dL (ref 32.0–35.9)
MCV: 99 fL — ABNORMAL HIGH (ref 82–98)
Platelets: 94 10*3/uL — ABNORMAL LOW (ref 145–400)
RBC: 4.69 10*6/uL (ref 4.20–5.70)
RDW: 15.3 % (ref 11.1–15.7)
WBC: 38.2 10*3/uL — ABNORMAL HIGH (ref 4.0–10.0)

## 2015-01-17 LAB — MANUAL DIFFERENTIAL (CHCC SATELLITE)
ALC: 34 10*3/uL — AB (ref 0.9–3.3)
ANC (CHCC HP manual diff): 3.8 10*3/uL (ref 1.5–6.5)
LYMPH: 89 % — ABNORMAL HIGH (ref 14–48)
MONO: 1 % (ref 0–13)
PLT EST ~~LOC~~: DECREASED
Platelet Morphology: NORMAL
SEG: 10 % — ABNORMAL LOW (ref 40–75)

## 2015-01-17 LAB — CHCC SATELLITE - SMEAR

## 2015-01-17 NOTE — Progress Notes (Signed)
Hematology and Oncology Follow Up Visit  PROSPER PAFF [id number] Aug 29, 1940 74 y.o. 01/17/2015   Principle Diagnosis:  1. Chronic lymphocytic leukemia - stage C  Current Therapy:   Observation    Interim History:  Mr. Golphin is here today for a follow-up. He is still doing well. He is playing pickle ball 2 days a week. He has had some issues with his blood sugars being elevated. He was on a medication to help prevent skin cancer and that was on e of the side effects. He has stopped taking it and now his blood glucose is back down.  He is followed by dermatology every 4 months. He had 2 spots removed in April.   He has had no problem with infections. His WBC count is 38.2. He is not anemic.  He denies fever, chills, n/v, cough, rash, dizziness, SOB, chest pain, palpitations, abdominal pain, constipation, diarrhea, blood in urine or stool.  No lymphadenopathy on exam.  No swelling, tenderness, numbness or tingling in his extremities. No new aches or pains.  His appetite is good and is staying hydrated. His weight is stable.   Medications:    Medication List       This list is accurate as of: 01/17/15  9:24 AM.  Always use your most recent med list.               aspirin EC 81 MG tablet  Take 81 mg by mouth daily.     brimonidine 0.2 % ophthalmic solution  Commonly known as:  ALPHAGAN  Place 1 drop into the left eye 2 (two) times daily.     DUREZOL 0.05 % Emul  Generic drug:  Difluprednate  Place 1 drop into the right eye daily as needed (for affected eye).     metoprolol succinate 25 MG 24 hr tablet  Commonly known as:  TOPROL-XL  Take 0.5 tablets (12.5 mg total) by mouth daily.     sildenafil 50 MG tablet  Commonly known as:  VIAGRA  Take 1 tablet (50 mg total) by mouth daily as needed for erectile dysfunction.     timolol 0.5 % ophthalmic solution  Commonly known as:  TIMOPTIC  Place 1 drop into the left eye daily. Left eye        Allergies:  Allergies    Allergen Reactions  . Other Anaphylaxis    Shellfish  . Shellfish-Derived Products Anaphylaxis  . Adhesive [Tape] Other (See Comments)    REDNESS  . Latex Rash    Past Medical History, Surgical history, Social history, and Family History were reviewed and updated.  Review of Systems: All other 10 point review of systems is negative.   Physical Exam:  height is 6' (1.829 m) and weight is 162 lb (73.483 kg). His oral temperature is 97.9 F (36.6 C). His blood pressure is 127/71 and his pulse is 84. His respiration is 18.   Wt Readings from Last 3 Encounters:  01/17/15 162 lb (73.483 kg)  08/24/14 165 lb (74.844 kg)  07/19/14 166 lb (75.297 kg)    Ocular: Sclerae unicteric, pupils equal, round and reactive to light Ear-nose-throat: Oropharynx clear, dentition fair Lymphatic: No cervical or supraclavicular adenopathy Lungs no rales or rhonchi, good excursion bilaterally Heart regular rate and rhythm, no murmur appreciated Abd soft, nontender, positive bowel sounds MSK no focal spinal tenderness, no joint edema Neuro: non-focal, well-oriented, appropriate affect  Lab Results  Component Value Date   WBC 33.9* 07/19/2014   HGB 15.0  07/19/2014   HCT 46.6 07/19/2014   MCV 98 07/19/2014   PLT 104* 07/19/2014   No results found for: FERRITIN, IRON, TIBC, UIBC, IRONPCTSAT Lab Results  Component Value Date   RETICCTPCT 1.0 06/15/2013   RBC 4.76 07/19/2014   RETICCTABS 47.6 06/15/2013   No results found for: Nils Pyle [facility] Lab Results  Component Value Date   IGGSERUM 980 06/15/2013   IGA 92 06/15/2013   IGMSERUM 100 06/15/2013   Lab Results  Component Value Date   TOTALPROTELP 6.5 06/15/2013   ALBUMINELP 61.7 06/15/2013   A1GS 3.5 06/15/2013   A2GS 9.8 06/15/2013   BETS 5.3 06/15/2013   BETA2SER 8.7* 06/15/2013   GAMS 11.0* 06/15/2013   MSPIKE NOT DET 06/15/2013   SPEI * 06/15/2013     Chemistry      Component Value Date/Time   NA 142  06/26/2013 1359   K 5.5* 06/26/2013 1359   CL 105 06/26/2013 1359   CO2 30 06/26/2013 1359   BUN 16 06/26/2013 1359   CREATININE 0.90 06/26/2013 1359      Component Value Date/Time   CALCIUM 9.6 06/26/2013 1359   ALKPHOS 59 06/15/2013 0805   AST 15 06/15/2013 0805   ALT 11 06/15/2013 0805   BILITOT 0.9 06/15/2013 0805     Impression and Plan: Mr. Haggart is very pleasant 74 year old male with CLL. We have not treated this but have been following him now for over 10 years. He continues to do well and is staying active. He is asymptomatic at this time.  His WBC count is 38.2 and platelets 94. No anemia.  We will plan to see him back in 6 months for labs and follow-up.  He knows to call here with any questions or concerns and to go to the ED in the event of an emergency. We can certainly see him sooner if need be.   Eliezer Bottom, NP 6/2/20169:24 AM

## 2015-02-11 ENCOUNTER — Other Ambulatory Visit: Payer: Self-pay

## 2015-02-19 DIAGNOSIS — L910 Hypertrophic scar: Secondary | ICD-10-CM | POA: Diagnosis not present

## 2015-02-19 DIAGNOSIS — Z808 Family history of malignant neoplasm of other organs or systems: Secondary | ICD-10-CM | POA: Diagnosis not present

## 2015-02-19 DIAGNOSIS — Z85828 Personal history of other malignant neoplasm of skin: Secondary | ICD-10-CM | POA: Diagnosis not present

## 2015-02-19 DIAGNOSIS — L821 Other seborrheic keratosis: Secondary | ICD-10-CM | POA: Diagnosis not present

## 2015-02-19 DIAGNOSIS — L570 Actinic keratosis: Secondary | ICD-10-CM | POA: Diagnosis not present

## 2015-02-19 DIAGNOSIS — B356 Tinea cruris: Secondary | ICD-10-CM | POA: Diagnosis not present

## 2015-02-19 DIAGNOSIS — L820 Inflamed seborrheic keratosis: Secondary | ICD-10-CM | POA: Diagnosis not present

## 2015-02-19 DIAGNOSIS — D2261 Melanocytic nevi of right upper limb, including shoulder: Secondary | ICD-10-CM | POA: Diagnosis not present

## 2015-04-02 DIAGNOSIS — E785 Hyperlipidemia, unspecified: Secondary | ICD-10-CM | POA: Diagnosis not present

## 2015-04-02 DIAGNOSIS — Z125 Encounter for screening for malignant neoplasm of prostate: Secondary | ICD-10-CM | POA: Diagnosis not present

## 2015-04-02 DIAGNOSIS — E1139 Type 2 diabetes mellitus with other diabetic ophthalmic complication: Secondary | ICD-10-CM | POA: Diagnosis not present

## 2015-04-04 DIAGNOSIS — L570 Actinic keratosis: Secondary | ICD-10-CM | POA: Diagnosis not present

## 2015-04-04 DIAGNOSIS — D485 Neoplasm of uncertain behavior of skin: Secondary | ICD-10-CM | POA: Diagnosis not present

## 2015-04-10 DIAGNOSIS — I4891 Unspecified atrial fibrillation: Secondary | ICD-10-CM | POA: Diagnosis not present

## 2015-04-10 DIAGNOSIS — C9100 Acute lymphoblastic leukemia not having achieved remission: Secondary | ICD-10-CM | POA: Diagnosis not present

## 2015-04-10 DIAGNOSIS — Z6822 Body mass index (BMI) 22.0-22.9, adult: Secondary | ICD-10-CM | POA: Diagnosis not present

## 2015-04-10 DIAGNOSIS — N401 Enlarged prostate with lower urinary tract symptoms: Secondary | ICD-10-CM | POA: Diagnosis not present

## 2015-04-10 DIAGNOSIS — E785 Hyperlipidemia, unspecified: Secondary | ICD-10-CM | POA: Diagnosis not present

## 2015-04-10 DIAGNOSIS — H3093 Unspecified chorioretinal inflammation, bilateral: Secondary | ICD-10-CM | POA: Diagnosis not present

## 2015-04-10 DIAGNOSIS — E1139 Type 2 diabetes mellitus with other diabetic ophthalmic complication: Secondary | ICD-10-CM | POA: Diagnosis not present

## 2015-04-10 DIAGNOSIS — Z0000 Encounter for general adult medical examination without abnormal findings: Secondary | ICD-10-CM | POA: Diagnosis not present

## 2015-04-15 DIAGNOSIS — Z1212 Encounter for screening for malignant neoplasm of rectum: Secondary | ICD-10-CM | POA: Diagnosis not present

## 2015-04-30 DIAGNOSIS — H3090 Unspecified chorioretinal inflammation, unspecified eye: Secondary | ICD-10-CM | POA: Diagnosis not present

## 2015-04-30 DIAGNOSIS — H4089 Other specified glaucoma: Secondary | ICD-10-CM | POA: Diagnosis not present

## 2015-04-30 DIAGNOSIS — H40043 Steroid responder, bilateral: Secondary | ICD-10-CM | POA: Diagnosis not present

## 2015-05-25 DIAGNOSIS — Z23 Encounter for immunization: Secondary | ICD-10-CM | POA: Diagnosis not present

## 2015-06-06 DIAGNOSIS — H401112 Primary open-angle glaucoma, right eye, moderate stage: Secondary | ICD-10-CM | POA: Diagnosis not present

## 2015-06-06 DIAGNOSIS — H401122 Primary open-angle glaucoma, left eye, moderate stage: Secondary | ICD-10-CM | POA: Diagnosis not present

## 2015-06-06 DIAGNOSIS — H10413 Chronic giant papillary conjunctivitis, bilateral: Secondary | ICD-10-CM | POA: Diagnosis not present

## 2015-06-06 DIAGNOSIS — H1132 Conjunctival hemorrhage, left eye: Secondary | ICD-10-CM | POA: Diagnosis not present

## 2015-07-22 ENCOUNTER — Other Ambulatory Visit (HOSPITAL_BASED_OUTPATIENT_CLINIC_OR_DEPARTMENT_OTHER): Payer: Medicare Other

## 2015-07-22 ENCOUNTER — Encounter: Payer: Self-pay | Admitting: Hematology & Oncology

## 2015-07-22 ENCOUNTER — Ambulatory Visit (HOSPITAL_BASED_OUTPATIENT_CLINIC_OR_DEPARTMENT_OTHER): Payer: Medicare Other | Admitting: Hematology & Oncology

## 2015-07-22 VITALS — BP 123/73 | HR 66 | Temp 97.4°F | Resp 20 | Ht 72.0 in | Wt 170.0 lb

## 2015-07-22 DIAGNOSIS — D72820 Lymphocytosis (symptomatic): Secondary | ICD-10-CM | POA: Diagnosis not present

## 2015-07-22 DIAGNOSIS — D696 Thrombocytopenia, unspecified: Secondary | ICD-10-CM | POA: Diagnosis not present

## 2015-07-22 DIAGNOSIS — C9110 Chronic lymphocytic leukemia of B-cell type not having achieved remission: Secondary | ICD-10-CM

## 2015-07-22 DIAGNOSIS — IMO0001 Reserved for inherently not codable concepts without codable children: Secondary | ICD-10-CM

## 2015-07-22 LAB — MANUAL DIFFERENTIAL (CHCC SATELLITE)
ALC: 22.3 10*3/uL — AB (ref 0.9–3.3)
ANC (CHCC HP manual diff): 4.6 10*3/uL (ref 1.5–6.5)
LYMPH: 82 % — AB (ref 14–48)
MONO: 1 % (ref 0–13)
PLATELET MORPHOLOGY: NORMAL
PLT EST ~~LOC~~: DECREASED
RBC Comments: NORMAL
SEG: 17 % — ABNORMAL LOW (ref 40–75)

## 2015-07-22 LAB — CBC WITH DIFFERENTIAL (CANCER CENTER ONLY)
HEMATOCRIT: 44.8 % (ref 38.7–49.9)
HGB: 14.2 g/dL (ref 13.0–17.1)
MCH: 30.9 pg (ref 28.0–33.4)
MCHC: 31.7 g/dL — ABNORMAL LOW (ref 32.0–35.9)
MCV: 98 fL (ref 82–98)
Platelets: 84 10*3/uL — ABNORMAL LOW (ref 145–400)
RBC: 4.59 10*6/uL (ref 4.20–5.70)
RDW: 14.5 % (ref 11.1–15.7)
WBC: 27.2 10*3/uL — AB (ref 4.0–10.0)

## 2015-07-22 LAB — CHCC SATELLITE - SMEAR

## 2015-07-22 NOTE — Progress Notes (Signed)
Hematology and Oncology Follow Up Visit  JAMA DILONE [id number] 1941/02/05 74 y.o. 07/22/2015   Principle Diagnosis:  1. Chronic lymphocytic leukemia - stage C  Current Therapy:   Observation    Interim History:  Lucas [name] is here today for a follow-up. He is doing well. He has no specific complaints. He has been traveling.  [place], the wildfires have not been a problem for him.  He has had no problems with bleeding or bruising. He has not noted any swollen lymph nodes.  His appetite is good. He's had no change in bowel or bladder habits.  His heart is doing okay. He's had no issues with respect to cardiac arrhythmias.  He's had no leg swelling.  He's had no cough or shortness of breath. He's had no chest wall pain.  There's not been any leg swelling.  Overall, his performance status is ECOG 0.  Medications:    Medication List       This list is accurate as of: 07/22/15  5:04 PM.  Always use your most recent med list.               aspirin EC 81 MG tablet  Take 81 mg by mouth daily.     brimonidine 0.2 % ophthalmic solution  Commonly known as:  ALPHAGAN  Place 1 drop into the left eye 2 (two) times daily.     DUREZOL 0.05 % Emul  Generic drug:  Difluprednate  Place 1 drop into the right eye daily as needed (for affected eye).     metoprolol succinate 25 MG 24 hr tablet  Commonly known as:  TOPROL-XL  Take 0.5 tablets (12.5 mg total) by mouth daily.     sildenafil 50 MG tablet  Commonly known as:  VIAGRA  Take 1 tablet (50 mg total) by mouth daily as needed for erectile dysfunction.     timolol 0.5 % ophthalmic solution  Commonly known as:  TIMOPTIC  Place 1 drop into the left eye daily. Left eye        Allergies:  Allergies  Allergen Reactions  . Other Anaphylaxis    Shellfish  . Shellfish-Derived Products Anaphylaxis  . Adhesive [Tape] Other (See Comments)    REDNESS  . Latex Rash    Past  Medical History, Surgical history, Social history, and Family History were reviewed and updated.  Review of Systems: All other 10 point review of systems is negative.   Physical Exam:  height is 6' (1.829 m) and weight is 170 lb (77.111 kg). His oral temperature is 97.4 F (36.3 C). His blood pressure is 123/73 and his pulse is 66. His respiration is 20.   Wt Readings from Last 3 Encounters:  07/22/15 170 lb (77.111 kg)  01/17/15 162 lb (73.483 kg)  08/24/14 165 lb (74.844 kg)    Well-developed and well-nourished white male in no obvious distress. Head and neck exam shows no ocular or oral lesions. He has no palpable cervical or supraclavicular lymph nodes. Lungs are clear bilaterally. Cardiac exam regular rate and rhythm with no murmurs, rubs or bruits. Axillary exam shows no bilateral axillary adenopathy. Abdomen is soft. He has good bowel sounds. There is no fluid wave. There is no palpable liver or spleen tip. Back exam shows no tenderness over the spine, ribs or hips. Extremities shows no clubbing, cyanosis or edema. Skin exam shows no rashes, ecchymoses or petechia. Neurological exam shows no focal neurological deficits.:  Lab Results  Component Value Date   WBC 27.2* 07/22/2015   HGB 14.2 07/22/2015   HCT 44.8 07/22/2015   MCV 98 07/22/2015   PLT 84* 07/22/2015   No results found for: FERRITIN, IRON, TIBC, UIBC, IRONPCTSAT Lab Results  Component Value Date   RETICCTPCT 1.0 06/15/2013   RBC 4.59 07/22/2015   RETICCTABS 47.6 06/15/2013   No results found for: Nils Pyle [facility] Lab Results  Component Value Date   IGGSERUM 980 06/15/2013   IGA 92 06/15/2013   IGMSERUM 100 06/15/2013   Lab Results  Component Value Date   TOTALPROTELP 6.5 06/15/2013   ALBUMINELP 61.7 06/15/2013   A1GS 3.5 06/15/2013   A2GS 9.8 06/15/2013   BETS 5.3 06/15/2013   BETA2SER 8.7* 06/15/2013   GAMS 11.0* 06/15/2013   MSPIKE NOT DET 06/15/2013   SPEI * 06/15/2013      Chemistry      Component Value Date/Time   NA 142 06/26/2013 1359   K 5.5* 06/26/2013 1359   CL 105 06/26/2013 1359   CO2 30 06/26/2013 1359   BUN 16 06/26/2013 1359   CREATININE 0.90 06/26/2013 1359      Component Value Date/Time   CALCIUM 9.6 06/26/2013 1359   ALKPHOS 59 06/15/2013 0805   AST 15 06/15/2013 0805   ALT 11 06/15/2013 0805   BILITOT 0.9 06/15/2013 0805     Impression and Plan: Lucas [name] is very pleasant 74 year old male with CLL. We have not had treated him now for over 15 years. However, we might be getting to the point where we might have to think about treatment. His platelet count continues to go down.  I looked at his blood smear. He does have the increased lymphocytosis. They do appear to be somewhat mature lymphocytes.  He is asymptomatic with the thrombocytopenia.  I will like to see him back in 3 months. I think that this will be adequate for [place] to determine whether or not we have to start therapy on him.  I taught him about this. I spent about 30 minutes with him. He understands the situation and why we are seeing him back sooner.  Lucas Napoleon, MD 12/5/20165:04 PM

## 2015-08-22 DIAGNOSIS — Z85828 Personal history of other malignant neoplasm of skin: Secondary | ICD-10-CM | POA: Diagnosis not present

## 2015-08-22 DIAGNOSIS — L570 Actinic keratosis: Secondary | ICD-10-CM | POA: Diagnosis not present

## 2015-08-22 DIAGNOSIS — D0439 Carcinoma in situ of skin of other parts of face: Secondary | ICD-10-CM | POA: Diagnosis not present

## 2015-08-22 DIAGNOSIS — Z23 Encounter for immunization: Secondary | ICD-10-CM | POA: Diagnosis not present

## 2015-08-22 DIAGNOSIS — D485 Neoplasm of uncertain behavior of skin: Secondary | ICD-10-CM | POA: Diagnosis not present

## 2015-08-22 DIAGNOSIS — L821 Other seborrheic keratosis: Secondary | ICD-10-CM | POA: Diagnosis not present

## 2015-08-22 DIAGNOSIS — Z808 Family history of malignant neoplasm of other organs or systems: Secondary | ICD-10-CM | POA: Diagnosis not present

## 2015-08-22 DIAGNOSIS — D2261 Melanocytic nevi of right upper limb, including shoulder: Secondary | ICD-10-CM | POA: Diagnosis not present

## 2015-09-04 ENCOUNTER — Ambulatory Visit (INDEPENDENT_AMBULATORY_CARE_PROVIDER_SITE_OTHER): Payer: Medicare Other | Admitting: Cardiovascular Disease

## 2015-09-04 ENCOUNTER — Encounter: Payer: Self-pay | Admitting: Cardiovascular Disease

## 2015-09-04 VITALS — BP 118/64 | HR 61 | Ht 72.0 in | Wt 170.7 lb

## 2015-09-04 DIAGNOSIS — I480 Paroxysmal atrial fibrillation: Secondary | ICD-10-CM | POA: Diagnosis not present

## 2015-09-04 NOTE — Progress Notes (Signed)
09/04/2015 Lucas [name]   Aug 26, 1940  [id number]  Primary Physician ARONSON,[name], MD Primary Cardiologist: Lucas Harp MD Lucas [name]   HPI:  The patient is a very pleasant 75 year old, thin and fit-appearing, married Caucasian male, father to 75 stepchild, grandfather to 2 step-grandchildren who I last saw in the office 12 months ago. He had diagnostic cath performed by me in January after a positive Myoview that revealed normal coronary arteries, normal LV function suggesting a false positive test. He has PAF with RVR in the past with atypical chest pain. He also has CLL which is quiescent followed by Dr. [name]. He did have a 3-week photo shoot safari in [place] in the [place] after I cath'd him and has taken [employer]. He is otherwise active and asymptomatic. His most recent lab work performed in January revealed total cholesterol 168, LDL 107, HDL 44. He was admitted to [facility] ER on 06/27/13 by Dr. [name] for A. Fib. Plans were to convert him with oral flecainide however he converted on his own. He was seen in the [facility] ER by Dr. [name]. Since I saw him in the office one year ago he has had one episode of PAF in March of last year while he was in [place]. His travel plans in the upcoming future R to go to [place], [place], [place] in [place] in May followed by [place] in September.   Current Outpatient Prescriptions  Medication Sig Dispense Refill  . aspirin EC 81 MG tablet Take 81 mg by mouth daily.    . brimonidine (ALPHAGAN) 0.2 % ophthalmic solution Place 1 drop into the left eye 2 (two) times daily.     . Difluprednate (DUREZOL) 0.05 % EMUL Place 1 drop into the right eye daily as needed (for affected eye).    . metoprolol succinate (TOPROL-XL) 25 MG 24 hr tablet Take 0.5 tablets (12.5 mg total) by mouth daily. 90 tablet 3  . sildenafil (VIAGRA) 50 MG tablet Take 1 tablet (50 mg total) by mouth daily as needed for erectile  dysfunction. 10 tablet 0  . timolol (TIMOPTIC) 0.5 % ophthalmic solution Place 1 drop into the left eye daily. Left eye     No current facility-administered medications for this visit.    Allergies  Allergen Reactions  . Other Anaphylaxis    Shellfish  . Shellfish-Derived Products Anaphylaxis  . Adhesive [Tape] Other (See Comments)    REDNESS  . Latex Rash    Social History   Social History  . Marital Status: Married    Spouse Name: N/A  . Number of Children: 1  . Years of Education: N/A   Occupational History  .     Social History Main Topics  . Smoking status: Former Smoker -- 1.00 packs/day for 3 years    Types: Cigarettes    Start date: 09/19/1962    Quit date: 12/17/1965  . Smokeless tobacco: Never Used     Comment: quit 47 yeras ago  . Alcohol Use: 2.4 oz/week    4 Glasses of wine per week  . Drug Use: No  . Sexual Activity: Not on file   Other Topics Concern  . Not on file   Social History Narrative   Lives with wife.       Review of Systems: General: negative for chills, fever, night sweats or weight changes.  Cardiovascular: negative for chest pain, dyspnea on exertion, edema, orthopnea, palpitations, paroxysmal nocturnal dyspnea or shortness of breath  Dermatological: negative for rash Respiratory: negative for cough or wheezing Urologic: negative for hematuria Abdominal: negative for nausea, vomiting, diarrhea, bright red blood per rectum, melena, or hematemesis Neurologic: negative for visual changes, syncope, or dizziness All other systems reviewed and are otherwise negative except as noted above.    Blood pressure 118/64, pulse 61, height 6' (1.829 m), weight 170 lb 11.2 oz (77.429 kg).  General appearance: alert and no distress Neck: no adenopathy, no carotid bruit, no JVD, supple, symmetrical, trachea midline and thyroid not enlarged, symmetric, no tenderness/mass/nodules Lungs: clear to auscultation bilaterally Heart: regular rate and  rhythm, S1, S2 normal, no murmur, click, rub or gallop Extremities: extremities normal, atraumatic, no cyanosis or edema  EKG /61 without ST or T-wave changes. I personally reviewed this EKG  ASSESSMENT AND PLAN:   Paroxysmal atrial fibrillation History of paroxysmal atrial fibrillation only on aspirin and beta blocker. His last episode was in March while he was in [place].      Lucas Harp MD FACP,FACC,FAHA, [facility] 09/04/2015 9:59 AM

## 2015-09-04 NOTE — Patient Instructions (Signed)

## 2015-09-04 NOTE — Assessment & Plan Note (Signed)
History of paroxysmal atrial fibrillation only on aspirin and beta blocker. His last episode was in March while he was in [place].

## 2015-09-18 DIAGNOSIS — L570 Actinic keratosis: Secondary | ICD-10-CM | POA: Diagnosis not present

## 2015-09-18 DIAGNOSIS — D0439 Carcinoma in situ of skin of other parts of face: Secondary | ICD-10-CM | POA: Diagnosis not present

## 2015-10-14 DIAGNOSIS — I4891 Unspecified atrial fibrillation: Secondary | ICD-10-CM | POA: Diagnosis not present

## 2015-10-14 DIAGNOSIS — M25562 Pain in left knee: Secondary | ICD-10-CM | POA: Diagnosis not present

## 2015-10-14 DIAGNOSIS — E784 Other hyperlipidemia: Secondary | ICD-10-CM | POA: Diagnosis not present

## 2015-10-14 DIAGNOSIS — E1139 Type 2 diabetes mellitus with other diabetic ophthalmic complication: Secondary | ICD-10-CM | POA: Diagnosis not present

## 2015-10-14 DIAGNOSIS — Z1389 Encounter for screening for other disorder: Secondary | ICD-10-CM | POA: Diagnosis not present

## 2015-10-14 DIAGNOSIS — N401 Enlarged prostate with lower urinary tract symptoms: Secondary | ICD-10-CM | POA: Diagnosis not present

## 2015-10-14 DIAGNOSIS — H3093 Unspecified chorioretinal inflammation, bilateral: Secondary | ICD-10-CM | POA: Diagnosis not present

## 2015-10-14 DIAGNOSIS — C9100 Acute lymphoblastic leukemia not having achieved remission: Secondary | ICD-10-CM | POA: Diagnosis not present

## 2015-10-14 DIAGNOSIS — N529 Male erectile dysfunction, unspecified: Secondary | ICD-10-CM | POA: Diagnosis not present

## 2015-10-14 DIAGNOSIS — Z6823 Body mass index (BMI) 23.0-23.9, adult: Secondary | ICD-10-CM | POA: Diagnosis not present

## 2015-10-21 ENCOUNTER — Other Ambulatory Visit: Payer: Medicare Other

## 2015-10-21 ENCOUNTER — Ambulatory Visit: Payer: Medicare Other | Admitting: Hematology & Oncology

## 2015-10-25 ENCOUNTER — Other Ambulatory Visit: Payer: Self-pay | Admitting: Cardiovascular Disease

## 2015-10-31 ENCOUNTER — Encounter: Payer: Self-pay | Admitting: Hematology & Oncology

## 2015-10-31 ENCOUNTER — Other Ambulatory Visit (HOSPITAL_BASED_OUTPATIENT_CLINIC_OR_DEPARTMENT_OTHER): Payer: Medicare Other

## 2015-10-31 ENCOUNTER — Ambulatory Visit (HOSPITAL_BASED_OUTPATIENT_CLINIC_OR_DEPARTMENT_OTHER): Payer: Medicare Other | Admitting: Hematology & Oncology

## 2015-10-31 VITALS — BP 118/73 | HR 71 | Temp 98.0°F | Resp 18 | Ht 72.0 in | Wt 170.0 lb

## 2015-10-31 DIAGNOSIS — C9110 Chronic lymphocytic leukemia of B-cell type not having achieved remission: Secondary | ICD-10-CM | POA: Diagnosis not present

## 2015-10-31 DIAGNOSIS — IMO0001 Reserved for inherently not codable concepts without codable children: Secondary | ICD-10-CM

## 2015-10-31 DIAGNOSIS — C91Z0 Other lymphoid leukemia not having achieved remission: Secondary | ICD-10-CM | POA: Diagnosis not present

## 2015-10-31 DIAGNOSIS — Z832 Family history of diseases of the blood and blood-forming organs and certain disorders involving the immune mechanism: Secondary | ICD-10-CM | POA: Diagnosis not present

## 2015-10-31 LAB — CBC WITH DIFFERENTIAL (CANCER CENTER ONLY)
BASO#: 0.1 10*3/uL (ref 0.0–0.2)
BASO%: 0.2 % (ref 0.0–2.0)
EOS%: 0 % (ref 0.0–7.0)
Eosinophils Absolute: 0 10*3/uL (ref 0.0–0.5)
HCT: 43.4 % (ref 38.7–49.9)
HGB: 14.1 g/dL (ref 13.0–17.1)
LYMPH#: 22.4 10*3/uL — ABNORMAL HIGH (ref 0.9–3.3)
LYMPH%: 86.8 % — ABNORMAL HIGH (ref 14.0–48.0)
MCH: 31.5 pg (ref 28.0–33.4)
MCHC: 32.5 g/dL (ref 32.0–35.9)
MCV: 97 fL (ref 82–98)
MONO#: 0.9 10*3/uL (ref 0.1–0.9)
MONO%: 3.6 % (ref 0.0–13.0)
NEUT%: 9.4 % — AB (ref 40.0–80.0)
NEUTROS ABS: 2.4 10*3/uL (ref 1.5–6.5)
Platelets: 91 10*3/uL — ABNORMAL LOW (ref 145–400)
RBC: 4.47 10*6/uL (ref 4.20–5.70)
RDW: 14.7 % (ref 11.1–15.7)
WBC: 25.8 10*3/uL — ABNORMAL HIGH (ref 4.0–10.0)

## 2015-10-31 LAB — TECHNOLOGIST REVIEW CHCC SATELLITE

## 2015-10-31 LAB — COMPREHENSIVE METABOLIC PANEL (CC13)
A/G RATIO: 1.8 (ref 1.2–2.2)
ALT: 10 IU/L (ref 0–44)
AST: 13 IU/L (ref 0–40)
Albumin, Serum: 4.1 g/dL (ref 3.5–4.8)
Alkaline Phosphatase, S: 37 IU/L — ABNORMAL LOW (ref 39–117)
BILIRUBIN TOTAL: 0.5 mg/dL (ref 0.0–1.2)
BUN/Creatinine Ratio: 16 (ref 10–22)
BUN: 14 mg/dL (ref 8–27)
CHLORIDE: 103 mmol/L (ref 96–106)
Calcium, Ser: 8.6 mg/dL (ref 8.6–10.2)
Carbon Dioxide, Total: 26 mmol/L (ref 18–29)
Creatinine, Ser: 0.86 mg/dL (ref 0.76–1.27)
GFR calc Af Amer: 99 mL/min/{1.73_m2} (ref >59)
GFR calc non Af Amer: 85 mL/min/{1.73_m2} (ref >59)
Globulin, Total: 2.3 g/dL (ref 1.5–4.5)
Glucose: 172 mg/dL — ABNORMAL HIGH (ref 65–99)
POTASSIUM: 4 mmol/L (ref 3.5–5.2)
Sodium: 136 mmol/L (ref 134–144)
Total Protein: 6.4 g/dL (ref 6.0–8.5)

## 2015-10-31 LAB — CHCC SATELLITE - SMEAR

## 2015-10-31 NOTE — Progress Notes (Signed)
Hematology and Oncology Follow Up Visit  AVETIS CASTER [id number] 1940-12-28 75 y.o. 10/31/2015   Principle Diagnosis:  1. Chronic lymphocytic leukemia - stage C  Current Therapy:   Observation    Interim History:  [name] is here today for a follow-up. He is doing well. He has no specific complaints. He has been traveling. He will be going up to [facility] soon. He will then be going over to [place] in April.  He feels great. He's been exercising. He's been much more active with exercise. He goes to a sports complex.  He's had no bleeding. He's had no bruising. He's had no fever. He's had no swollen nodes. He's had no change in bowel or bladder habits. He's had no leg swelling. He's had no rashes. He's had no cough. He's had no shortness of breath.  Overall, his performance status is ECOG 0.  Medications:    Medication List       This list is accurate as of: 10/31/15  3:59 PM.  Always use your most recent med list.               aspirin EC 81 MG tablet  Take 81 mg by mouth daily.     brimonidine 0.2 % ophthalmic solution  Commonly known as:  ALPHAGAN  Place 1 drop into the left eye 2 (two) times daily.     DUREZOL 0.05 % Emul  Generic drug:  Difluprednate  Place 1 drop into the right eye daily as needed (for affected eye).     metoprolol succinate 25 MG 24 hr tablet  Commonly known as:  TOPROL-XL  Take 0.5 tablets (12.5 mg total) by mouth daily. Please make appointment.     sildenafil 50 MG tablet  Commonly known as:  VIAGRA  Take 1 tablet (50 mg total) by mouth daily as needed for erectile dysfunction.     timolol 0.5 % ophthalmic solution  Commonly known as:  TIMOPTIC  Place 1 drop into the left eye daily. Left eye        Allergies:  Allergies  Allergen Reactions  . Other Anaphylaxis    Shellfish  . Shellfish-Derived Products Anaphylaxis  . Adhesive [Tape] Other (See Comments)    REDNESS  . Latex Rash    Past Medical History, Surgical  history, Social history, and Family History were reviewed and updated.  Review of Systems: All other 10 point review of systems is negative.   Physical Exam:  height is 6' (1.829 m) and weight is 170 lb (77.111 kg). His oral temperature is 98 F (36.7 C). His blood pressure is 118/73 and his pulse is 71. His respiration is 18.   Wt Readings from Last 3 Encounters:  10/31/15 170 lb (77.111 kg)  09/04/15 170 lb 11.2 oz (77.429 kg)  07/22/15 170 lb (77.111 kg)    Well-developed and well-nourished white male in no obvious distress. Head and neck exam shows no ocular or oral lesions. He has no palpable cervical or supraclavicular lymph nodes. Lungs are clear bilaterally. Cardiac exam regular rate and rhythm with no murmurs, rubs or bruits. Axillary exam shows no bilateral axillary adenopathy. Abdomen is soft. He has good bowel sounds. There is no fluid wave. There is no palpable liver or spleen tip. Back exam shows no tenderness over the spine, ribs or hips. Extremities shows no clubbing, cyanosis or edema. Skin exam shows no rashes, ecchymoses or petechia. Neurological exam shows no focal neurological deficits.:   Lab  Results  Component Value Date   WBC 27.2* 07/22/2015   HGB 14.2 07/22/2015   HCT 44.8 07/22/2015   MCV 98 07/22/2015   PLT 84* 07/22/2015   No results found for: FERRITIN, IRON, TIBC, UIBC, IRONPCTSAT Lab Results  Component Value Date   RETICCTPCT 1.0 06/15/2013   RBC 4.59 07/22/2015   RETICCTABS 47.6 06/15/2013   No results found for: Nils Pyle [facility] Lab Results  Component Value Date   IGGSERUM 980 06/15/2013   IGA 92 06/15/2013   IGMSERUM 100 06/15/2013   Lab Results  Component Value Date   TOTALPROTELP 6.5 06/15/2013   ALBUMINELP 61.7 06/15/2013   A1GS 3.5 06/15/2013   A2GS 9.8 06/15/2013   BETS 5.3 06/15/2013   BETA2SER 8.7* 06/15/2013   GAMS 11.0* 06/15/2013   MSPIKE NOT DET 06/15/2013   SPEI * 06/15/2013     Chemistry        Component Value Date/Time   NA 142 06/26/2013 1359   K 5.5* 06/26/2013 1359   CL 105 06/26/2013 1359   CO2 30 06/26/2013 1359   BUN 16 06/26/2013 1359   CREATININE 0.90 06/26/2013 1359      Component Value Date/Time   CALCIUM 9.6 06/26/2013 1359   ALKPHOS 59 06/15/2013 0805   AST 15 06/15/2013 0805   ALT 11 06/15/2013 0805   BILITOT 0.9 06/15/2013 0805     Impression and Plan: Mr. Frees is very pleasant 75 year old male with CLL. We have not had treated him now for over 15 years.   From my point of view, everything is holding stable. His platelet count is not dropping. His white cell count is not any worse.  Overall, I think we've probably get him back in about 6 months. I did this would be reasonable. As always, he goes on his travels. I am sure that he will have a great time over in [place].  Volanda Napoleon, MD 3/16/20173:59 PM

## 2015-11-01 LAB — KAPPA/LAMBDA LIGHT CHAINS
IG LAMBDA FREE LIGHT CHAIN: 9.93 mg/L (ref 5.71–26.30)
Ig Kappa Free Light Chain: 18.18 mg/L (ref 3.30–19.40)
KAPPA/LAMBDA FLC RATIO: 1.83 — AB (ref 0.26–1.65)

## 2015-11-01 LAB — IGG, IGA, IGM
IGA/IMMUNOGLOBULIN A, SERUM: 85 mg/dL (ref 61–437)
IGM (IMMUNOGLOBIN M), SRM: 97 mg/dL (ref 15–143)
IgG, Qn, Serum: 818 mg/dL (ref 700–1600)

## 2015-11-01 LAB — LACTATE DEHYDROGENASE: LDH: 164 U/L (ref 125–245)

## 2015-11-01 LAB — BETA 2 MICROGLOBULIN, SERUM: BETA 2: 1.6 mg/L (ref 0.6–2.4)

## 2015-11-04 LAB — PROTEIN ELECTROPHORESIS, SERUM, WITH REFLEX
A/G RATIO SPE: 1.6 (ref 0.7–1.7)
Albumin: 3.7 g/dL (ref 2.9–4.4)
Alpha 1: 0.2 g/dL (ref 0.0–0.4)
Alpha 2: 0.6 g/dL (ref 0.4–1.0)
BETA: 0.8 g/dL (ref 0.7–1.3)
GLOBULIN, TOTAL: 2.3 g/dL (ref 2.2–3.9)
Gamma Globulin: 0.8 g/dL (ref 0.4–1.8)
Total Protein: 6 g/dL (ref 6.0–8.5)

## 2015-11-25 ENCOUNTER — Other Ambulatory Visit: Payer: Self-pay | Admitting: *Deleted

## 2015-11-25 MED ORDER — METOPROLOL SUCCINATE ER 25 MG PO TB24: 12.5000 mg | ORAL_TABLET | Freq: Every day | ORAL | Status: DC

## 2016-01-20 DIAGNOSIS — Z6823 Body mass index (BMI) 23.0-23.9, adult: Secondary | ICD-10-CM | POA: Diagnosis not present

## 2016-01-20 DIAGNOSIS — J019 Acute sinusitis, unspecified: Secondary | ICD-10-CM | POA: Diagnosis not present

## 2016-02-13 DIAGNOSIS — L570 Actinic keratosis: Secondary | ICD-10-CM | POA: Diagnosis not present

## 2016-02-13 DIAGNOSIS — L821 Other seborrheic keratosis: Secondary | ICD-10-CM | POA: Diagnosis not present

## 2016-02-13 DIAGNOSIS — Z85828 Personal history of other malignant neoplasm of skin: Secondary | ICD-10-CM | POA: Diagnosis not present

## 2016-02-13 DIAGNOSIS — L817 Pigmented purpuric dermatosis: Secondary | ICD-10-CM | POA: Diagnosis not present

## 2016-02-13 DIAGNOSIS — Z808 Family history of malignant neoplasm of other organs or systems: Secondary | ICD-10-CM | POA: Diagnosis not present

## 2016-02-13 DIAGNOSIS — D2261 Melanocytic nevi of right upper limb, including shoulder: Secondary | ICD-10-CM | POA: Diagnosis not present

## 2016-02-13 DIAGNOSIS — H401112 Primary open-angle glaucoma, right eye, moderate stage: Secondary | ICD-10-CM | POA: Diagnosis not present

## 2016-02-13 DIAGNOSIS — H401122 Primary open-angle glaucoma, left eye, moderate stage: Secondary | ICD-10-CM | POA: Diagnosis not present

## 2016-02-13 DIAGNOSIS — B359 Dermatophytosis, unspecified: Secondary | ICD-10-CM | POA: Diagnosis not present

## 2016-02-13 DIAGNOSIS — L910 Hypertrophic scar: Secondary | ICD-10-CM | POA: Diagnosis not present

## 2016-04-06 DIAGNOSIS — Z125 Encounter for screening for malignant neoplasm of prostate: Secondary | ICD-10-CM | POA: Diagnosis not present

## 2016-04-06 DIAGNOSIS — E1139 Type 2 diabetes mellitus with other diabetic ophthalmic complication: Secondary | ICD-10-CM | POA: Diagnosis not present

## 2016-04-06 DIAGNOSIS — E784 Other hyperlipidemia: Secondary | ICD-10-CM | POA: Diagnosis not present

## 2016-04-13 DIAGNOSIS — G629 Polyneuropathy, unspecified: Secondary | ICD-10-CM | POA: Diagnosis not present

## 2016-04-13 DIAGNOSIS — N401 Enlarged prostate with lower urinary tract symptoms: Secondary | ICD-10-CM | POA: Diagnosis not present

## 2016-04-13 DIAGNOSIS — M25562 Pain in left knee: Secondary | ICD-10-CM | POA: Diagnosis not present

## 2016-04-13 DIAGNOSIS — C9100 Acute lymphoblastic leukemia not having achieved remission: Secondary | ICD-10-CM | POA: Diagnosis not present

## 2016-04-13 DIAGNOSIS — E784 Other hyperlipidemia: Secondary | ICD-10-CM | POA: Diagnosis not present

## 2016-04-13 DIAGNOSIS — E1149 Type 2 diabetes mellitus with other diabetic neurological complication: Secondary | ICD-10-CM | POA: Diagnosis not present

## 2016-04-13 DIAGNOSIS — Z0000 Encounter for general adult medical examination without abnormal findings: Secondary | ICD-10-CM | POA: Diagnosis not present

## 2016-04-13 DIAGNOSIS — I4891 Unspecified atrial fibrillation: Secondary | ICD-10-CM | POA: Diagnosis not present

## 2016-04-13 DIAGNOSIS — Z6822 Body mass index (BMI) 22.0-22.9, adult: Secondary | ICD-10-CM | POA: Diagnosis not present

## 2016-04-13 DIAGNOSIS — N529 Male erectile dysfunction, unspecified: Secondary | ICD-10-CM | POA: Diagnosis not present

## 2016-04-13 DIAGNOSIS — E1139 Type 2 diabetes mellitus with other diabetic ophthalmic complication: Secondary | ICD-10-CM | POA: Diagnosis not present

## 2016-04-17 DIAGNOSIS — Z1212 Encounter for screening for malignant neoplasm of rectum: Secondary | ICD-10-CM | POA: Diagnosis not present

## 2016-05-07 ENCOUNTER — Other Ambulatory Visit: Payer: Medicare Other

## 2016-05-07 ENCOUNTER — Ambulatory Visit: Payer: Medicare Other | Admitting: Hematology & Oncology

## 2016-05-25 ENCOUNTER — Encounter: Payer: Self-pay | Admitting: Hematology & Oncology

## 2016-05-25 ENCOUNTER — Ambulatory Visit (HOSPITAL_BASED_OUTPATIENT_CLINIC_OR_DEPARTMENT_OTHER): Payer: Medicare Other | Admitting: Hematology & Oncology

## 2016-05-25 ENCOUNTER — Other Ambulatory Visit (HOSPITAL_BASED_OUTPATIENT_CLINIC_OR_DEPARTMENT_OTHER): Payer: Medicare Other

## 2016-05-25 VITALS — BP 120/86 | HR 80 | Temp 97.7°F | Resp 16 | Ht 72.0 in | Wt 167.8 lb

## 2016-05-25 DIAGNOSIS — IMO0001 Reserved for inherently not codable concepts without codable children: Secondary | ICD-10-CM

## 2016-05-25 DIAGNOSIS — C9110 Chronic lymphocytic leukemia of B-cell type not having achieved remission: Secondary | ICD-10-CM

## 2016-05-25 LAB — CBC WITH DIFFERENTIAL (CANCER CENTER ONLY)
BASO#: 0.1 10*3/uL (ref 0.0–0.2)
BASO%: 0.3 % (ref 0.0–2.0)
EOS%: 0.1 % (ref 0.0–7.0)
Eosinophils Absolute: 0 10*3/uL (ref 0.0–0.5)
HEMATOCRIT: 48.1 % (ref 38.7–49.9)
HEMOGLOBIN: 16 g/dL (ref 13.0–17.1)
LYMPH#: 22.3 10*3/uL — AB (ref 0.9–3.3)
LYMPH%: 88.6 % — ABNORMAL HIGH (ref 14.0–48.0)
MCH: 31.7 pg (ref 28.0–33.4)
MCHC: 33.3 g/dL (ref 32.0–35.9)
MCV: 95 fL (ref 82–98)
MONO#: 0.3 10*3/uL (ref 0.1–0.9)
MONO%: 1.3 % (ref 0.0–13.0)
NEUT%: 9.7 % — ABNORMAL LOW (ref 40.0–80.0)
NEUTROS ABS: 2.5 10*3/uL (ref 1.5–6.5)
Platelets: 89 10*3/uL — ABNORMAL LOW (ref 145–400)
RBC: 5.04 10*6/uL (ref 4.20–5.70)
RDW: 14.6 % (ref 11.1–15.7)
WBC: 25.2 10*3/uL — AB (ref 4.0–10.0)

## 2016-05-25 LAB — CHCC SATELLITE - SMEAR

## 2016-05-25 LAB — TECHNOLOGIST REVIEW CHCC SATELLITE

## 2016-05-25 NOTE — Progress Notes (Signed)
Hematology and Oncology Follow Up Visit  Lucas [name] [id number] 03-20-1941 75 y.o. 05/25/2016   Principle Diagnosis:  1. Chronic lymphocytic leukemia - stage C  Current Therapy:   Observation    Interim History:  Lucas [name] is here today for a follow-up. He is doing well. He has no specific complaints. He has been traveling. He just got back from [place]. He and his wife had a great time.  This summer, he went to the [employer] [place] and really enjoyed this. He was very impressed with the taking nausea that was over there.  He is exercising. He really looks fit. He is eating well. He is watching what he eats. He is not having any cardiac issues.  He has not noted any swollen lymph nodes. He has had no rashes. He has had no change in bowel or bladder habits.   Overall, his performance status is ECOG 0.  Medications:    Medication List       Accurate as of 05/25/16  9:25 AM. Always use your most recent med list.          aspirin EC 81 MG tablet Take 81 mg by mouth daily.   brimonidine 0.2 % ophthalmic solution Commonly known as:  ALPHAGAN Place 1 drop into the left eye 2 (two) times daily.   DUREZOL 0.05 % Emul Generic drug:  Difluprednate Place 1 drop into the right eye daily as needed (for affected eye).   metoprolol succinate 25 MG 24 hr tablet Commonly known as:  TOPROL-XL Take 0.5 tablets (12.5 mg total) by mouth daily.   sildenafil 50 MG tablet Commonly known as:  VIAGRA Take 1 tablet (50 mg total) by mouth daily as needed for erectile dysfunction.   timolol 0.5 % ophthalmic solution Commonly known as:  TIMOPTIC Place 1 drop into the left eye daily. Left eye       Allergies:  Allergies  Allergen Reactions  . Other Anaphylaxis    Shellfish  . Shellfish-Derived Products Anaphylaxis  . Adhesive [Tape] Other (See Comments)    REDNESS  . Latex Rash    Past Medical History, Surgical history, Social history, and Family  History were reviewed and updated.  Review of Systems: All other 10 point review of systems is negative.   Physical Exam:  height is 6' (1.829 m) and weight is 167 lb 12.8 oz (76.1 kg). His oral temperature is 97.7 F (36.5 C). His blood pressure is 120/86 and his pulse is 80. His respiration is 16.   Wt Readings from Last 3 Encounters:  05/25/16 167 lb 12.8 oz (76.1 kg)  10/31/15 170 lb (77.1 kg)  09/04/15 170 lb 11.2 oz (77.4 kg)    Well-developed and well-nourished white male in no obvious distress. Head and neck exam shows no ocular or oral lesions. He has no palpable cervical or supraclavicular lymph nodes. Lungs are clear bilaterally. Cardiac exam regular rate and rhythm with no murmurs, rubs or bruits. Axillary exam shows no bilateral axillary adenopathy. Abdomen is soft. He has good bowel sounds. There is no fluid wave. There is no palpable liver or spleen tip. Back exam shows no tenderness over the spine, ribs or hips. Extremities shows no clubbing, cyanosis or edema. Skin exam shows no rashes, ecchymoses or petechia. Neurological exam shows no focal neurological deficits.:   Lab Results  Component Value Date   WBC 25.2 (H) 05/25/2016   HGB 16.0 05/25/2016   HCT 48.1 05/25/2016   MCV  95 05/25/2016   PLT 89 (L) 05/25/2016   No results found for: FERRITIN, IRON, TIBC, UIBC, IRONPCTSAT Lab Results  Component Value Date   RETICCTPCT 1.0 06/15/2013   RBC 5.04 05/25/2016   RETICCTABS 47.6 06/15/2013   Lab Results  Component Value Date   KAPLAMBRATIO 1.83 (H) 10/31/2015   Lab Results  Component Value Date   IGGSERUM 818 10/31/2015   IGA 92 06/15/2013   IGMSERUM 97 10/31/2015   Lab Results  Component Value Date   TOTALPROTELP 6.5 06/15/2013   ALBUMINELP 61.7 06/15/2013   A1GS 3.5 06/15/2013   A2GS 9.8 06/15/2013   BETS 5.3 06/15/2013   BETA2SER 8.7 (H) 06/15/2013   GAMS 11.0 (L) 06/15/2013   MSPIKE Not Observed 10/31/2015   SPEI * 06/15/2013     Chemistry        Component Value Date/Time   NA 136 10/31/2015 1513   K 4.0 10/31/2015 1513   CL 103 10/31/2015 1513   CO2 26 10/31/2015 1513   BUN 14 10/31/2015 1513   CREATININE 0.86 10/31/2015 1513      Component Value Date/Time   CALCIUM 8.6 10/31/2015 1513   ALKPHOS 37 (L) 10/31/2015 1513   AST 13 10/31/2015 1513   ALT 10 10/31/2015 1513   BILITOT 0.5 10/31/2015 1513     Impression and Plan: Lucas [name] is very pleasant 75 year old male with CLL. We have not had treated him now for over 15 years.   From my point of view, everything is holding stable. His platelet count is not dropping. His white cell count is not any worse.  Overall, I think we've probably get him back in about 6 months. I think  this would be reasonable.   He will continue to exercise. He will be going to [place] next summer. Hopefully, he will bring back some [place] chocolate!!!!  Volanda Napoleon, MD 10/9/20179:25 AM

## 2016-05-26 DIAGNOSIS — H40043 Steroid responder, bilateral: Secondary | ICD-10-CM | POA: Diagnosis not present

## 2016-05-26 DIAGNOSIS — H4089 Other specified glaucoma: Secondary | ICD-10-CM | POA: Diagnosis not present

## 2016-05-30 DIAGNOSIS — Z23 Encounter for immunization: Secondary | ICD-10-CM | POA: Diagnosis not present

## 2016-06-10 DIAGNOSIS — D485 Neoplasm of uncertain behavior of skin: Secondary | ICD-10-CM | POA: Diagnosis not present

## 2016-06-10 DIAGNOSIS — C44329 Squamous cell carcinoma of skin of other parts of face: Secondary | ICD-10-CM | POA: Diagnosis not present

## 2016-06-10 DIAGNOSIS — Z23 Encounter for immunization: Secondary | ICD-10-CM | POA: Diagnosis not present

## 2016-07-01 DIAGNOSIS — C44329 Squamous cell carcinoma of skin of other parts of face: Secondary | ICD-10-CM | POA: Diagnosis not present

## 2016-07-29 DIAGNOSIS — N401 Enlarged prostate with lower urinary tract symptoms: Secondary | ICD-10-CM | POA: Diagnosis not present

## 2016-07-29 DIAGNOSIS — C9100 Acute lymphoblastic leukemia not having achieved remission: Secondary | ICD-10-CM | POA: Diagnosis not present

## 2016-07-29 DIAGNOSIS — E784 Other hyperlipidemia: Secondary | ICD-10-CM | POA: Diagnosis not present

## 2016-07-29 DIAGNOSIS — E1149 Type 2 diabetes mellitus with other diabetic neurological complication: Secondary | ICD-10-CM | POA: Diagnosis not present

## 2016-07-29 DIAGNOSIS — N528 Other male erectile dysfunction: Secondary | ICD-10-CM | POA: Diagnosis not present

## 2016-07-29 DIAGNOSIS — J302 Other seasonal allergic rhinitis: Secondary | ICD-10-CM | POA: Diagnosis not present

## 2016-07-29 DIAGNOSIS — Z6822 Body mass index (BMI) 22.0-22.9, adult: Secondary | ICD-10-CM | POA: Diagnosis not present

## 2016-07-29 DIAGNOSIS — H3561 Retinal hemorrhage, right eye: Secondary | ICD-10-CM | POA: Diagnosis not present

## 2016-07-29 DIAGNOSIS — H26491 Other secondary cataract, right eye: Secondary | ICD-10-CM | POA: Diagnosis not present

## 2016-07-29 DIAGNOSIS — H538 Other visual disturbances: Secondary | ICD-10-CM | POA: Diagnosis not present

## 2016-07-29 DIAGNOSIS — M25562 Pain in left knee: Secondary | ICD-10-CM | POA: Diagnosis not present

## 2016-07-29 DIAGNOSIS — H35371 Puckering of macula, right eye: Secondary | ICD-10-CM | POA: Diagnosis not present

## 2016-07-29 DIAGNOSIS — E1139 Type 2 diabetes mellitus with other diabetic ophthalmic complication: Secondary | ICD-10-CM | POA: Diagnosis not present

## 2016-07-29 DIAGNOSIS — G6289 Other specified polyneuropathies: Secondary | ICD-10-CM | POA: Diagnosis not present

## 2016-07-29 DIAGNOSIS — I4891 Unspecified atrial fibrillation: Secondary | ICD-10-CM | POA: Diagnosis not present

## 2016-07-29 DIAGNOSIS — H3093 Unspecified chorioretinal inflammation, bilateral: Secondary | ICD-10-CM | POA: Diagnosis not present

## 2016-08-26 DIAGNOSIS — L821 Other seborrheic keratosis: Secondary | ICD-10-CM | POA: Diagnosis not present

## 2016-08-26 DIAGNOSIS — D2261 Melanocytic nevi of right upper limb, including shoulder: Secondary | ICD-10-CM | POA: Diagnosis not present

## 2016-08-26 DIAGNOSIS — Z808 Family history of malignant neoplasm of other organs or systems: Secondary | ICD-10-CM | POA: Diagnosis not present

## 2016-08-26 DIAGNOSIS — B353 Tinea pedis: Secondary | ICD-10-CM | POA: Diagnosis not present

## 2016-08-26 DIAGNOSIS — L910 Hypertrophic scar: Secondary | ICD-10-CM | POA: Diagnosis not present

## 2016-08-26 DIAGNOSIS — Z85828 Personal history of other malignant neoplasm of skin: Secondary | ICD-10-CM | POA: Diagnosis not present

## 2016-08-26 DIAGNOSIS — Z23 Encounter for immunization: Secondary | ICD-10-CM | POA: Diagnosis not present

## 2016-08-26 DIAGNOSIS — L570 Actinic keratosis: Secondary | ICD-10-CM | POA: Diagnosis not present

## 2016-09-08 DIAGNOSIS — H40043 Steroid responder, bilateral: Secondary | ICD-10-CM | POA: Diagnosis not present

## 2016-09-08 DIAGNOSIS — H26491 Other secondary cataract, right eye: Secondary | ICD-10-CM | POA: Diagnosis not present

## 2016-09-09 ENCOUNTER — Encounter: Payer: Self-pay | Admitting: Cardiovascular Disease

## 2016-09-09 ENCOUNTER — Ambulatory Visit (INDEPENDENT_AMBULATORY_CARE_PROVIDER_SITE_OTHER): Payer: Medicare Other | Admitting: Cardiovascular Disease

## 2016-09-09 DIAGNOSIS — I480 Paroxysmal atrial fibrillation: Secondary | ICD-10-CM | POA: Diagnosis not present

## 2016-09-09 NOTE — Assessment & Plan Note (Signed)
History of paroxysmal atrial fibrillation status post spontaneous conversion in the past. His last episode was 3 years ago in [place] and has had no recurrence since.

## 2016-09-09 NOTE — Patient Instructions (Signed)
Medication Instructions: Your physician recommends that you continue on your current medications as directed. Please refer to the Current Medication list given to you today.  Follow-Up: Your physician wants you to follow-up in: 1 year wth Dr. Gwenlyn Found. You will receive a reminder letter in the mail two months in advance. If you don't receive a letter, please call our office to schedule the follow-up appointment.  If you need a refill on your cardiac medications before your next appointment, please call your pharmacy.

## 2016-09-09 NOTE — Progress Notes (Signed)
09/09/2016 Sissy Hoff   05-21-1941  CJ:[id number]  Primary Physician Geoffery Lyons, MD Primary Cardiologist: [name] MD Lupe Carney, [place]  HPI:  The patient is a very pleasant 76 year old, thin and fit-appearing, married Caucasian male, father to 76 stepchild, grandfather to 2 step-grandchildren who I last saw in the office 09/04/15. He had diagnostic cath performed by me in January after a positive Myoview that revealed normal coronary arteries, normal LV function suggesting a false positive test. He has PAF with RVR in the past with atypical chest pain. He also has CLL which is quiescent followed by Dr. [name]. He did have a 3-week photo shoot safari in [place] in the [place] after I cath'd him and has taken [employer]. He is otherwise active and asymptomatic. His most recent lab work performed in January revealed total cholesterol 168, LDL 107, HDL 44. He was admitted to [facility] ER on 06/27/13 by Dr. [name] for A. Fib. Plans were to convert him with oral flecainide however he converted on his own. He was seen in the [facility] ER by Dr. [name]. Since I saw him in the office one year ago he has had one episode of PAF in March of 2015 while he was in [place]. His travel plans in the upcoming future included [place], [place], [place] in [place] in May followed by [place] in September. He also plans to go to [place] in July and thr [place] ([place]  October.   Current Outpatient Prescriptions  Medication Sig Dispense Refill  . aspirin EC 81 MG tablet Take 81 mg by mouth daily.    . brimonidine (ALPHAGAN) 0.2 % ophthalmic solution Place 1 drop into the left eye 2 (two) times daily.     . metoprolol succinate (TOPROL-XL) 25 MG 24 hr tablet Take 0.5 tablets (12.5 mg total) by mouth daily. 45 tablet 3  . sildenafil (VIAGRA) 50 MG tablet Take 1 tablet (50 mg total) by mouth daily as needed for erectile dysfunction. 10 tablet 0  . timolol  (TIMOPTIC) 0.5 % ophthalmic solution Place 1 drop into the left eye daily. Left eye     No current facility-administered medications for this visit.     Allergies  Allergen Reactions  . Other Anaphylaxis    Shellfish  . Shellfish-Derived Products Anaphylaxis  . Adhesive [Tape] Other (See Comments)    REDNESS  . Latex Rash    Social History   Social History  . Marital status: Married    Spouse name: N/A  . Number of children: 1  . Years of education: N/A   Occupational History  .  Retired   Social History Main Topics  . Smoking status: Former Smoker    Packs/day: 1.00    Years: 3.00    Types: Cigarettes    Start date: 09/19/1962    Quit date: 12/17/1965  . Smokeless tobacco: Never Used     Comment: quit 47 yeras ago  . Alcohol use 2.4 oz/week    4 Glasses of wine per week  . Drug use: No  . Sexual activity: Not on file   Other Topics Concern  . Not on file   Social History Narrative   Lives with wife.       Review of Systems: General: negative for chills, fever, night sweats or weight changes.  Cardiovascular: negative for chest pain, dyspnea on exertion, edema, orthopnea, palpitations, paroxysmal nocturnal dyspnea or shortness of breath Dermatological: negative for rash Respiratory: negative for  cough or wheezing Urologic: negative for hematuria Abdominal: negative for nausea, vomiting, diarrhea, bright red blood per rectum, melena, or hematemesis Neurologic: negative for visual changes, syncope, or dizziness All other systems reviewed and are otherwise negative except as noted above.    Blood pressure 122/68, pulse 67, height 6' (1.829 m), weight 168 lb (76.2 kg).  General appearance: alert and no distress Neck: no adenopathy, no carotid bruit, no JVD, supple, symmetrical, trachea midline and thyroid not enlarged, symmetric, no tenderness/mass/nodules Lungs: clear to auscultation bilaterally Heart: regular rate and rhythm, S1, S2 normal, no murmur, click,  rub or gallop Extremities: extremities normal, atraumatic, no cyanosis or edema  EKG normal sinus rhythm at 67 without ST or T-wave changes. I personally reviewed this EKG  ASSESSMENT AND PLAN:   Paroxysmal atrial fibrillation History of paroxysmal atrial fibrillation status post spontaneous conversion in the past. His last episode was 3 years ago in [place] and has had no recurrence since.      [name] MD FACP,FACC,FAHA, [facility] 09/09/2016 9:59 AM

## 2016-09-21 DIAGNOSIS — H25812 Combined forms of age-related cataract, left eye: Secondary | ICD-10-CM | POA: Diagnosis not present

## 2016-10-09 DIAGNOSIS — G8929 Other chronic pain: Secondary | ICD-10-CM | POA: Diagnosis not present

## 2016-10-09 DIAGNOSIS — M1712 Unilateral primary osteoarthritis, left knee: Secondary | ICD-10-CM | POA: Diagnosis not present

## 2016-10-09 DIAGNOSIS — M238X2 Other internal derangements of left knee: Secondary | ICD-10-CM | POA: Diagnosis not present

## 2016-10-09 DIAGNOSIS — M25562 Pain in left knee: Secondary | ICD-10-CM | POA: Diagnosis not present

## 2016-11-05 DIAGNOSIS — H2512 Age-related nuclear cataract, left eye: Secondary | ICD-10-CM | POA: Diagnosis not present

## 2016-11-05 DIAGNOSIS — H25812 Combined forms of age-related cataract, left eye: Secondary | ICD-10-CM | POA: Diagnosis not present

## 2016-11-05 DIAGNOSIS — H52222 Regular astigmatism, left eye: Secondary | ICD-10-CM | POA: Diagnosis not present

## 2016-11-23 ENCOUNTER — Ambulatory Visit (HOSPITAL_BASED_OUTPATIENT_CLINIC_OR_DEPARTMENT_OTHER): Payer: Medicare Other | Admitting: Hematology & Oncology

## 2016-11-23 ENCOUNTER — Other Ambulatory Visit (HOSPITAL_BASED_OUTPATIENT_CLINIC_OR_DEPARTMENT_OTHER): Payer: Medicare Other

## 2016-11-23 VITALS — BP 123/75 | HR 72 | Temp 97.7°F | Resp 16 | Wt 165.4 lb

## 2016-11-23 DIAGNOSIS — IMO0001 Reserved for inherently not codable concepts without codable children: Secondary | ICD-10-CM

## 2016-11-23 DIAGNOSIS — C9110 Chronic lymphocytic leukemia of B-cell type not having achieved remission: Secondary | ICD-10-CM

## 2016-11-23 LAB — CBC WITH DIFFERENTIAL (CANCER CENTER ONLY)
BASO#: 0 10*3/uL (ref 0.0–0.2)
BASO%: 0.1 % (ref 0.0–2.0)
EOS ABS: 0 10*3/uL (ref 0.0–0.5)
EOS%: 0 % (ref 0.0–7.0)
HEMATOCRIT: 45.1 % (ref 38.7–49.9)
HGB: 14.6 g/dL (ref 13.0–17.1)
LYMPH#: 35 10*3/uL — ABNORMAL HIGH (ref 0.9–3.3)
LYMPH%: 91.8 % — ABNORMAL HIGH (ref 14.0–48.0)
MCH: 31.6 pg (ref 28.0–33.4)
MCHC: 32.4 g/dL (ref 32.0–35.9)
MCV: 98 fL (ref 82–98)
MONO#: 0.5 10*3/uL (ref 0.1–0.9)
MONO%: 1.2 % (ref 0.0–13.0)
NEUT#: 2.6 10*3/uL (ref 1.5–6.5)
NEUT%: 6.9 % — AB (ref 40.0–80.0)
Platelets: 75 10*3/uL — ABNORMAL LOW (ref 145–400)
RBC: 4.62 10*6/uL (ref 4.20–5.70)
RDW: 14.9 % (ref 11.1–15.7)
WBC: 38.2 10*3/uL — ABNORMAL HIGH (ref 4.0–10.0)

## 2016-11-23 LAB — CHCC SATELLITE - SMEAR

## 2016-11-23 NOTE — Progress Notes (Signed)
Hematology and Oncology Follow Up Visit  Lucas [name] [id number] 02-Apr-1941 76 y.o. 11/23/2016   Principle Diagnosis:  1. Chronic lymphocytic leukemia - stage C  Current Therapy:   Observation    Interim History:  Lucas [name] is here today for a follow-up. He is doing well. He has no specific complaints. He had cataract surgery for the left eye 2 weeks ago. This is done at [facility]. He is recovering from this. He can start exercising again.  He was last seen by [place] back last year. He had no problems over the holidays. He has a mouth home that he is not been 2 over the wintertime. Hopefully he will be able to get back to his not home soon.  He is planning to go over to [place] twice this summer. In July he will will be going to [place]. In October he will be going to [place] and the [employer].  He's had no problems with infections. He's had no influenza. He's had no rashes. He's not had any palpable lymph glands. He's had no cough.   Overall, his performance status is ECOG 0.  Medications:  Allergies as of 11/23/2016      Reactions   Other Anaphylaxis   Shellfish   Shellfish-derived Products Anaphylaxis   Adhesive [tape] Other (See Comments)   REDNESS   Latex Rash      Medication List       Accurate as of 11/23/16  9:52 AM. Always use your most recent med list.          aspirin EC 81 MG tablet Take 81 mg by mouth daily.   brimonidine 0.2 % ophthalmic solution Commonly known as:  ALPHAGAN Place 1 drop into the left eye 2 (two) times daily.   ketorolac 0.5 % ophthalmic solution Commonly known as:  ACULAR 1 drop 3x/day in operated eye. Start after surgery. Use for 4 weeks.   metoprolol succinate 25 MG 24 hr tablet Commonly known as:  TOPROL-XL Take 0.5 tablets (12.5 mg total) by mouth daily.   ofloxacin 0.3 % ophthalmic solution Commonly known as:  OCUFLOX   prednisoLONE acetate 1 % ophthalmic suspension Commonly known as:  PRED FORTE   sildenafil 50 MG  tablet Commonly known as:  VIAGRA Take 1 tablet (50 mg total) by mouth daily as needed for erectile dysfunction.   timolol 0.5 % ophthalmic solution Commonly known as:  TIMOPTIC Place 1 drop into the left eye daily. Left eye       Allergies:  Allergies  Allergen Reactions  . Other Anaphylaxis    Shellfish  . Shellfish-Derived Products Anaphylaxis  . Adhesive [Tape] Other (See Comments)    REDNESS  . Latex Rash    Past Medical History, Surgical history, Social history, and Family History were reviewed and updated.  Review of Systems: All other 10 point review of systems is negative.   Physical Exam:  weight is 165 lb 6.4 oz (75 kg). His oral temperature is 97.7 F (36.5 C). His blood pressure is 123/75 and his pulse is 72. His respiration is 16 and oxygen saturation is 98%.   Wt Readings from Last 3 Encounters:  11/23/16 165 lb 6.4 oz (75 kg)  09/09/16 168 lb (76.2 kg)  05/25/16 167 lb 12.8 oz (76.1 kg)    Well-developed and well-nourished white male in no obvious distress. Head and neck exam shows no ocular or oral lesions. He has no palpable cervical or supraclavicular lymph nodes. Lungs are clear bilaterally.  Cardiac exam regular rate and rhythm with no murmurs, rubs or bruits. Axillary exam shows no bilateral axillary adenopathy. Abdomen is soft. He has good bowel sounds. There is no fluid wave. There is no palpable liver or spleen tip. Back exam shows no tenderness over the spine, ribs or hips. Extremities shows no clubbing, cyanosis or edema. Skin exam shows no rashes, ecchymoses or petechia. Neurological exam shows no focal neurological deficits.:   Lab Results  Component Value Date   WBC 38.2 (H) 11/23/2016   HGB 14.6 11/23/2016   HCT 45.1 11/23/2016   MCV 98 11/23/2016   PLT 75 (L) 11/23/2016   No results found for: FERRITIN, IRON, TIBC, UIBC, IRONPCTSAT Lab Results  Component Value Date   RETICCTPCT 1.0 06/15/2013   RBC 4.62 11/23/2016   RETICCTABS 47.6  06/15/2013   Lab Results  Component Value Date   KAPLAMBRATIO 1.83 (H) 10/31/2015   Lab Results  Component Value Date   IGGSERUM 818 10/31/2015   IGA 92 06/15/2013   IGMSERUM 97 10/31/2015   Lab Results  Component Value Date   TOTALPROTELP 6.5 06/15/2013   ALBUMINELP 61.7 06/15/2013   A1GS 3.5 06/15/2013   A2GS 9.8 06/15/2013   BETS 5.3 06/15/2013   BETA2SER 8.7 (H) 06/15/2013   GAMS 11.0 (L) 06/15/2013   MSPIKE Not Observed 10/31/2015   SPEI * 06/15/2013     Chemistry      Component Value Date/Time   NA 136 10/31/2015 1513   K 4.0 10/31/2015 1513   CL 103 10/31/2015 1513   CO2 26 10/31/2015 1513   BUN 14 10/31/2015 1513   CREATININE 0.86 10/31/2015 1513      Component Value Date/Time   CALCIUM 8.6 10/31/2015 1513   ALKPHOS 37 (L) 10/31/2015 1513   AST 13 10/31/2015 1513   ALT 10 10/31/2015 1513   BILITOT 0.5 10/31/2015 1513     Impression and Plan: Lucas [name] is very pleasant 76 year old male with CLL. We have not had treated him now for over 15 years.   I am a little worried about his thrombocytopenia progressing. His white cell count certainly is higher. However, his white cell count has been high before.  I cannot palpate any adenopathy.  I'll like to have him come back in 2 months. I really want to make sure we make a decision regarding treatment before he heads over to [place] for his medications.   I'm glad that he will start his exercising again. He had a cataract surgery 2 weeks go and cannot exercise. Now, he will start back into exercising which she is happy about.    Volanda Napoleon, MD 4/9/20189:52 AM

## 2016-11-25 ENCOUNTER — Encounter: Payer: Self-pay | Admitting: Hematology & Oncology

## 2016-11-25 DIAGNOSIS — E1149 Type 2 diabetes mellitus with other diabetic neurological complication: Secondary | ICD-10-CM | POA: Diagnosis not present

## 2016-11-25 DIAGNOSIS — Z6822 Body mass index (BMI) 22.0-22.9, adult: Secondary | ICD-10-CM | POA: Diagnosis not present

## 2016-11-25 DIAGNOSIS — E1139 Type 2 diabetes mellitus with other diabetic ophthalmic complication: Secondary | ICD-10-CM | POA: Diagnosis not present

## 2016-11-25 DIAGNOSIS — C9100 Acute lymphoblastic leukemia not having achieved remission: Secondary | ICD-10-CM | POA: Diagnosis not present

## 2016-11-26 ENCOUNTER — Other Ambulatory Visit: Payer: Self-pay | Admitting: Cardiovascular Disease

## 2016-11-26 ENCOUNTER — Encounter: Payer: Self-pay | Admitting: Hematology & Oncology

## 2016-11-26 NOTE — Telephone Encounter (Signed)
REFILL 

## 2016-12-30 ENCOUNTER — Encounter: Payer: Self-pay | Admitting: Cardiovascular Disease

## 2017-01-12 DIAGNOSIS — H903 Sensorineural hearing loss, bilateral: Secondary | ICD-10-CM | POA: Diagnosis not present

## 2017-01-25 ENCOUNTER — Ambulatory Visit (HOSPITAL_BASED_OUTPATIENT_CLINIC_OR_DEPARTMENT_OTHER): Payer: Medicare Other | Admitting: Hematology & Oncology

## 2017-01-25 ENCOUNTER — Other Ambulatory Visit (HOSPITAL_BASED_OUTPATIENT_CLINIC_OR_DEPARTMENT_OTHER): Payer: Medicare Other

## 2017-01-25 VITALS — BP 125/69 | HR 70 | Temp 98.1°F | Resp 16 | Wt 165.0 lb

## 2017-01-25 DIAGNOSIS — C9110 Chronic lymphocytic leukemia of B-cell type not having achieved remission: Secondary | ICD-10-CM

## 2017-01-25 DIAGNOSIS — IMO0001 Reserved for inherently not codable concepts without codable children: Secondary | ICD-10-CM

## 2017-01-25 DIAGNOSIS — C91Z0 Other lymphoid leukemia not having achieved remission: Secondary | ICD-10-CM | POA: Diagnosis not present

## 2017-01-25 LAB — MANUAL DIFFERENTIAL (CHCC SATELLITE)
ALC: 25.3 10*3/uL — AB (ref 0.9–3.3)
ANC (CHCC MAN DIFF): 3.2 10*3/uL (ref 1.5–6.5)
LYMPH: 87 % — ABNORMAL HIGH (ref 14–48)
MONO: 2 % (ref 0–13)
PLT EST ~~LOC~~: DECREASED
RBC Comments: NORMAL
SEG: 11 % — ABNORMAL LOW (ref 40–75)

## 2017-01-25 LAB — CBC WITH DIFFERENTIAL (CANCER CENTER ONLY)
HEMATOCRIT: 48.4 % (ref 38.7–49.9)
HGB: 15.6 g/dL (ref 13.0–17.1)
MCH: 31.9 pg (ref 28.0–33.4)
MCHC: 32.2 g/dL (ref 32.0–35.9)
MCV: 99 fL — AB (ref 82–98)
Platelets: 83 10*3/uL — ABNORMAL LOW (ref 145–400)
RBC: 4.89 10*6/uL (ref 4.20–5.70)
RDW: 14.8 % (ref 11.1–15.7)
WBC: 29.1 10*3/uL — AB (ref 4.0–10.0)

## 2017-01-25 LAB — CMP (CANCER CENTER ONLY)
ALBUMIN: 3.9 g/dL (ref 3.3–5.5)
ALK PHOS: 60 U/L (ref 26–84)
ALT: 18 U/L (ref 10–47)
AST: 19 U/L (ref 11–38)
BILIRUBIN TOTAL: 1 mg/dL (ref 0.20–1.60)
BUN, Bld: 17 mg/dL (ref 7–22)
CALCIUM: 8.8 mg/dL (ref 8.0–10.3)
CO2: 32 mEq/L (ref 18–33)
Chloride: 103 mEq/L (ref 98–108)
Creat: 1 mg/dl (ref 0.6–1.2)
GLUCOSE: 271 mg/dL — AB (ref 73–118)
Potassium: 4.5 mEq/L (ref 3.3–4.7)
Sodium: 142 mEq/L (ref 128–145)
TOTAL PROTEIN: 6.8 g/dL (ref 6.4–8.1)

## 2017-01-25 LAB — CHCC SATELLITE - SMEAR

## 2017-01-25 NOTE — Progress Notes (Signed)
Hematology and Oncology Follow Up Visit  TEANCUM BRULE [id number] 01-Aug-1941 76 y.o. 01/25/2017   Principle Diagnosis:  1. Chronic lymphocytic leukemia - stage C  Current Therapy:   Observation    Interim History:  Mr. Reid is here today for a follow-up. He is getting ready for his first trip over to [place] this summer. He will be going to [place]. He will fly and [place] and then [place] to [place].  He is looking forward to this.  He did have a bout of atrial fibrillation a couple weeks ago. This was self limited.  He has had no problem with fever. He's had no bruising. He's had no bleeding. He's had no leg swelling.  He's had no fever.  His appetite has been quite good. He's had no nausea or vomiting.  He's not noted any palpable lymph glands.  Overall, his performance status is ECOG 0.  Medications:  Allergies as of 01/25/2017      Reactions   Other Anaphylaxis   Shellfish   Shellfish-derived Products Anaphylaxis   Adhesive [tape] Other (See Comments)   REDNESS   Latex Rash      Medication List       Accurate as of 01/25/17 10:21 AM. Always use your most recent med list.          aspirin EC 81 MG tablet Take 81 mg by mouth daily.   brimonidine 0.2 % ophthalmic solution Commonly known as:  ALPHAGAN Place 1 drop into the left eye 2 (two) times daily.   metoprolol succinate 25 MG 24 hr tablet Commonly known as:  TOPROL-XL Take 0.5 tablets (12.5 mg total) by mouth daily.   sildenafil 50 MG tablet Commonly known as:  VIAGRA Take 1 tablet (50 mg total) by mouth daily as needed for erectile dysfunction.   timolol 0.5 % ophthalmic solution Commonly known as:  TIMOPTIC Place 1 drop into the left eye daily. Left eye       Allergies:  Allergies  Allergen Reactions  . Other Anaphylaxis    Shellfish  . Shellfish-Derived Products Anaphylaxis  . Adhesive [Tape] Other (See Comments)    REDNESS  . Latex Rash    Past Medical History, Surgical  history, Social history, and Family History were reviewed and updated.  Review of Systems: All other 10 point review of systems is negative.   Physical Exam:  weight is 165 lb (74.8 kg). His oral temperature is 98.1 F (36.7 C). His blood pressure is 125/69 and his pulse is 70. His respiration is 16 and oxygen saturation is 96%.   Wt Readings from Last 3 Encounters:  01/25/17 165 lb (74.8 kg)  11/23/16 165 lb 6.4 oz (75 kg)  09/09/16 168 lb (76.2 kg)    Well-developed and well-nourished white male in no obvious distress. Head and neck exam shows no ocular or oral lesions. He has no palpable cervical or supraclavicular lymph nodes. Lungs are clear bilaterally. Cardiac exam regular rate and rhythm with no murmurs, rubs or bruits. Axillary exam shows no bilateral axillary adenopathy. Abdomen is soft. He has good bowel sounds. There is no fluid wave. There is no palpable liver or spleen tip. Back exam shows no tenderness over the spine, ribs or hips. Extremities shows no clubbing, cyanosis or edema. Skin exam shows no rashes, ecchymoses or petechia. Neurological exam shows no focal neurological deficits.:   Lab Results  Component Value Date   WBC 29.1 (H) 01/25/2017   HGB 15.6 01/25/2017   HCT  48.4 01/25/2017   MCV 99 (H) 01/25/2017   PLT 83 Platelet count consistent in citrate (L) 01/25/2017   No results found for: FERRITIN, IRON, TIBC, UIBC, IRONPCTSAT Lab Results  Component Value Date   RETICCTPCT 1.0 06/15/2013   RBC 4.89 01/25/2017   RETICCTABS 47.6 06/15/2013   Lab Results  Component Value Date   KAPLAMBRATIO 1.83 (H) 10/31/2015   Lab Results  Component Value Date   IGGSERUM 818 10/31/2015   IGA 92 06/15/2013   IGMSERUM 97 10/31/2015   Lab Results  Component Value Date   TOTALPROTELP 6.5 06/15/2013   ALBUMINELP 61.7 06/15/2013   A1GS 3.5 06/15/2013   A2GS 9.8 06/15/2013   BETS 5.3 06/15/2013   BETA2SER 8.7 (H) 06/15/2013   GAMS 11.0 (L) 06/15/2013   MSPIKE Not  Observed 10/31/2015   SPEI * 06/15/2013     Chemistry      Component Value Date/Time   NA 142 01/25/2017 0855   K 4.5 01/25/2017 0855   CL 103 01/25/2017 0855   CO2 32 01/25/2017 0855   BUN 17 01/25/2017 0855   CREATININE 1.0 01/25/2017 0855      Component Value Date/Time   CALCIUM 8.8 01/25/2017 0855   ALKPHOS 60 01/25/2017 0855   AST 19 01/25/2017 0855   ALT 18 01/25/2017 0855   BILITOT 1.00 01/25/2017 0855     Impression and Plan: Mr. Whalley is very pleasant 76 year old male with CLL. We have not had treated him now for over 15 years.   I have feeling much better about his CLL right now. I'm happy that everything looks relatively stable.  I think we'll probably get him back in the fall. He will be going to [place] and then [place] in October. We'll see him back after he gets back from his trips across the [facility].   He certainly knows to call at there are any problems.   Volanda Napoleon, MD 6/11/201810:21 AM

## 2017-01-26 LAB — PROTEIN ELECTROPHORESIS, SERUM, WITH REFLEX
A/G RATIO SPE: 1.3 (ref 0.7–1.7)
ALBUMIN: 3.6 g/dL (ref 2.9–4.4)
Alpha 1: 0.2 g/dL (ref 0.0–0.4)
Alpha 2: 0.7 g/dL (ref 0.4–1.0)
BETA: 0.8 g/dL (ref 0.7–1.3)
GLOBULIN, TOTAL: 2.7 g/dL (ref 2.2–3.9)
Gamma Globulin: 1 g/dL (ref 0.4–1.8)
Total Protein: 6.3 g/dL (ref 6.0–8.5)

## 2017-01-26 LAB — IGG, IGA, IGM
IGA/IMMUNOGLOBULIN A, SERUM: 81 mg/dL (ref 61–437)
IGM (IMMUNOGLOBIN M), SRM: 113 mg/dL (ref 15–143)
IgG, Qn, Serum: 895 mg/dL (ref 700–1600)

## 2017-03-16 ENCOUNTER — Telehealth: Payer: Self-pay | Admitting: *Deleted

## 2017-03-16 ENCOUNTER — Encounter: Payer: Self-pay | Admitting: Cardiovascular Disease

## 2017-03-16 NOTE — Telephone Encounter (Signed)
Spoke with pt, he reported that he was on a train and did not feel the atrial fib coming on. He woke up on the floor and then after about 3 hours was fine again. He currently is fine and does not feel like he needs to be seen. Patient voiced understanding to call if concerned.

## 2017-04-02 DIAGNOSIS — L821 Other seborrheic keratosis: Secondary | ICD-10-CM | POA: Diagnosis not present

## 2017-04-02 DIAGNOSIS — D2261 Melanocytic nevi of right upper limb, including shoulder: Secondary | ICD-10-CM | POA: Diagnosis not present

## 2017-04-02 DIAGNOSIS — Z808 Family history of malignant neoplasm of other organs or systems: Secondary | ICD-10-CM | POA: Diagnosis not present

## 2017-04-02 DIAGNOSIS — L570 Actinic keratosis: Secondary | ICD-10-CM | POA: Diagnosis not present

## 2017-04-02 DIAGNOSIS — Z85828 Personal history of other malignant neoplasm of skin: Secondary | ICD-10-CM | POA: Diagnosis not present

## 2017-04-02 DIAGNOSIS — B353 Tinea pedis: Secondary | ICD-10-CM | POA: Diagnosis not present

## 2017-04-13 ENCOUNTER — Telehealth: Payer: Self-pay | Admitting: Cardiovascular Disease

## 2017-04-13 NOTE — Telephone Encounter (Signed)
Pt of Dr. Gwenlyn Found with PAF. Pt called stating he woke up from a nap today with his heart rate in the 90s. States he took an ASA 81 and an extra half tab of metoprolol and HR has come down to 80 which he states is high for him.  Pt states last month, he fainted, not knowing he was in afib--did not know what happened, but woke up on the floor.   Grace Bushy Decola  to [name], MD     8:22 AM  I had another a-fib bout on July 23 while in [place]. This was the second in two months after going 2.5 years without one. Pulse was back to normal in less than three hours. I fainted when this one came on.  Cristopher Estimable, RN  03/16/17 2:09 PM  Note    Spoke with pt, he reported that he was on a train and did not feel the atrial fib coming on. He woke up on the floor and then after about 3 hours was fine again. He currently is fine and does not feel like he needs to be seen. Patient voiced understanding to call if concerned.        Pt also states he is having irregular heartbeat more frequently, states it feels different than afib. It lasts 2-3 hours at a time. Stated he had not had an episode in 2.5 years until last month and would like to be evaluated.  Stated to pt that Dr. Gwenlyn Found is currently out of town, but there was an opening tomorrow with Dr. Ellyn Hack (DOD). Pt was agreeable to this. Pt scheduled at 10:40AM.  Routing message to Dr. Ellyn Hack and Ivin Booty, RN ad Juluis Rainier.

## 2017-04-13 NOTE — Telephone Encounter (Signed)
New message      Pt states that he is having irregular heartbeat.  His fitbit said at 3pm after a nap it was 90.  At the time of the call it was 70.  Please advise

## 2017-04-14 ENCOUNTER — Ambulatory Visit (INDEPENDENT_AMBULATORY_CARE_PROVIDER_SITE_OTHER): Payer: Medicare Other | Admitting: Cardiology

## 2017-04-14 ENCOUNTER — Encounter: Payer: Self-pay | Admitting: Cardiology

## 2017-04-14 VITALS — BP 100/62 | HR 60 | Ht 72.0 in | Wt 168.0 lb

## 2017-04-14 DIAGNOSIS — R55 Syncope and collapse: Secondary | ICD-10-CM | POA: Diagnosis not present

## 2017-04-14 DIAGNOSIS — I480 Paroxysmal atrial fibrillation: Secondary | ICD-10-CM | POA: Diagnosis not present

## 2017-04-14 NOTE — Patient Instructions (Addendum)
CONTINUE TO DOUBLE UP ON METOPROLOL AND TAKE ASPIRIN -IF EPISODE REOCCUR UNTIL YOU SEE DR BERRY IN SEPT.   NO OTHER CHANGES   Your physician recommends that you schedule a follow-up appointment in SEPT 2018 [place].
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
## 2017-04-17 ENCOUNTER — Encounter: Payer: Self-pay | Admitting: Cardiology

## 2017-04-17 NOTE — Assessment & Plan Note (Signed)
After ~3 yrs without an episodes, now 3 in 3-4 months & lasting longer.  I talked about potentially using film the pocket flecainide, because of Dr. Kennon Holter comments in the past about an episode when he was sent to the emergency room for cardioversion with flecainide. He mentioned that Dr. Gwenlyn Found had commented that he should not of been on that medicine. As a result, he is not willing to take the medicine until he talks to Dr. Gwenlyn Found. I will send the clinic note along with this note to Dr. Gwenlyn Found in order to let him decide.  For now - continue with plan of taking an additional does of Toprol (he takes baseline 12.5 mg) for breakthrough episodes along with ASA.

## 2017-04-17 NOTE — Assessment & Plan Note (Signed)
The syncopal episode while in [place] seems to be more related to a vasovagal episode that may well have triggered an Afib episode.  I doubt that the Afib led to syncope - he did not feel the Afib until after he "came-to".  Afib is not usually a cause for syncope..   Encouraged adequate hydration.

## 2017-04-21 DIAGNOSIS — E784 Other hyperlipidemia: Secondary | ICD-10-CM | POA: Diagnosis not present

## 2017-04-21 DIAGNOSIS — Z125 Encounter for screening for malignant neoplasm of prostate: Secondary | ICD-10-CM | POA: Diagnosis not present

## 2017-04-21 DIAGNOSIS — E1149 Type 2 diabetes mellitus with other diabetic neurological complication: Secondary | ICD-10-CM | POA: Diagnosis not present

## 2017-04-25 NOTE — Telephone Encounter (Signed)
I  Have no problems for Lucas [name] to use Flecainide pill im the pocket for PAF that doesn't spontaneously convert

## 2017-04-27 ENCOUNTER — Ambulatory Visit (INDEPENDENT_AMBULATORY_CARE_PROVIDER_SITE_OTHER): Payer: Medicare Other | Admitting: Cardiovascular Disease

## 2017-04-27 ENCOUNTER — Encounter: Payer: Self-pay | Admitting: Cardiovascular Disease

## 2017-04-27 DIAGNOSIS — I480 Paroxysmal atrial fibrillation: Secondary | ICD-10-CM

## 2017-04-27 NOTE — Patient Instructions (Signed)

## 2017-04-27 NOTE — Progress Notes (Signed)
04/27/2017 Lucas [name]   [birth year]  [id number]  Primary Physician Burnard Bunting, MD Primary Cardiologist: [name] MD Lupe Car[place], [place]  HPI:  Lucas [name] is a 76 y.o. male , thin and fit-appearing, married Caucasian male, father to 2 stepchild, grandfather to 2 step-grandchildren who I last saw in the office 09/09/16. He had diagnostic cath performed by me in January after a positive Myoview that revealed normal coronary arteries, normal LV function suggesting a false positive test. He has PAF with RVR in the past with atypical chest pain. He also has CLL which is quiescent followed by Dr. [name]. He did have a 3-week photo shoot safari in [place] in the [place] after I cath'd him and has taken [employer]. He is otherwise active and asymptomatic. His most recent lab work performed in January revealed total cholesterol 168, LDL 107, HDL 44. He was admitted to [facility] ER on 06/27/13 by Dr. [name] for A. Fib. Plans were to convert him with oral flecainide however he converted on his own. He was seen in the [facility] ER by Dr. [name]. Since I saw him in the office one year ago he has had one episode of PAF in March of 2015 while he was in [place]. His travel plans in the upcoming future included [place], [place], [place] in [place] in May followed by [place] in September.  he was recently in [place] and had an episode of PAF on July 23 well on a train and a dining car. His most recent episode was on 04/13/17 after being woken up from a nap with rapid heart rate. This took 5 hours to spontaneously convert.    Current Meds  Medication Sig  . aspirin EC 81 MG tablet Take 81 mg by mouth daily.  . brimonidine (ALPHAGAN) 0.2 % ophthalmic solution Place 1 drop into the left eye 2 (two) times daily.   . metoprolol succinate (TOPROL-XL) 25 MG 24 hr tablet Take 0.5 tablets (12.5 mg total) by mouth daily.  . sildenafil (VIAGRA) 50 MG tablet Take 1  tablet (50 mg total) by mouth daily as needed for erectile dysfunction.  . timolol (TIMOPTIC) 0.5 % ophthalmic solution Place 1 drop into the left eye daily. Left eye     Allergies  Allergen Reactions  . Other Anaphylaxis    Shellfish  . Shellfish-Derived Products Anaphylaxis  . Adhesive [Tape] Other (See Comments)    REDNESS  . Latex Rash    Social History   Social History  . Marital status: Married    Spouse name: N/A  . Number of children: 1  . Years of education: N/A   Occupational History  .  Retired   Social History Main Topics  . Smoking status: Former Smoker    Packs/day: 1.00    Years: 3.00    Types: Cigarettes    Start date: 09/19/1962    Quit date: 12/17/1965  . Smokeless tobacco: Never Used     Comment: quit 47 yeras ago  . Alcohol use 2.4 oz/week    4 Glasses of wine per week  . Drug use: No  . Sexual activity: Not on file   Other Topics Concern  . Not on file   Social History Narrative   Married Caucasian male, father to 2 stepchild, grandfather to 2 step-grandchildren.   Lives with his wife.   Travels a lot & enjoys photography.     Review of Systems: General: negative for chills,  fever, night sweats or weight changes.  Cardiovascular: negative for chest pain, dyspnea on exertion, edema, orthopnea, palpitations, paroxysmal nocturnal dyspnea or shortness of breath Dermatological: negative for rash Respiratory: negative for cough or wheezing Urologic: negative for hematuria Abdominal: negative for nausea, vomiting, diarrhea, bright red blood per rectum, melena, or hematemesis Neurologic: negative for visual changes, syncope, or dizziness All other systems reviewed and are otherwise negative except as noted above.    Blood pressure 122/76, pulse 68, height 6' (1.829 m), weight 169 lb (76.7 kg).  General appearance: alert and no distress Neck: no adenopathy, no carotid bruit, no JVD, supple, symmetrical, trachea midline and thyroid not enlarged,  symmetric, no tenderness/mass/nodules Lungs: clear to auscultation bilaterally Heart: regular rate and rhythm, S1, S2 normal, no murmur, click, rub or gallop Extremities: extremities normal, atraumatic, no cyanosis or edema  EKG not performed today  ASSESSMENT AND PLAN:   Paroxysmal atrial fibrillation (HCC) History of PAF with more frequent episodes now 3 in the last 4 months. His CHAD2VASASc score is 1. He is not on oral anticoagulation. Talked about "pill in the pocket"/flecainide and have decided not not to pursue this. Should his episodes continue to be more frequent will consider referral for ablation.      [name] MD FACP,FACC,FAHA, [facility] 04/27/2017 1:48 PM

## 2017-04-27 NOTE — Assessment & Plan Note (Signed)
History of PAF with more frequent episodes now 3 in the last 4 months. His CHAD2VASASc score is 1. He is not on oral anticoagulation. Talked about "pill in the pocket"/flecainide and have decided not not to pursue this. Should his episodes continue to be more frequent will consider referral for ablation.

## 2017-04-28 DIAGNOSIS — Z1389 Encounter for screening for other disorder: Secondary | ICD-10-CM | POA: Diagnosis not present

## 2017-04-28 DIAGNOSIS — N401 Enlarged prostate with lower urinary tract symptoms: Secondary | ICD-10-CM | POA: Diagnosis not present

## 2017-04-28 DIAGNOSIS — E784 Other hyperlipidemia: Secondary | ICD-10-CM | POA: Diagnosis not present

## 2017-04-28 DIAGNOSIS — Z0000 Encounter for general adult medical examination without abnormal findings: Secondary | ICD-10-CM | POA: Diagnosis not present

## 2017-04-28 DIAGNOSIS — Z6822 Body mass index (BMI) 22.0-22.9, adult: Secondary | ICD-10-CM | POA: Diagnosis not present

## 2017-04-28 DIAGNOSIS — C9100 Acute lymphoblastic leukemia not having achieved remission: Secondary | ICD-10-CM | POA: Diagnosis not present

## 2017-04-28 DIAGNOSIS — H3093 Unspecified chorioretinal inflammation, bilateral: Secondary | ICD-10-CM | POA: Diagnosis not present

## 2017-04-28 DIAGNOSIS — I4891 Unspecified atrial fibrillation: Secondary | ICD-10-CM | POA: Diagnosis not present

## 2017-04-28 DIAGNOSIS — H9193 Unspecified hearing loss, bilateral: Secondary | ICD-10-CM | POA: Diagnosis not present

## 2017-04-28 DIAGNOSIS — E1149 Type 2 diabetes mellitus with other diabetic neurological complication: Secondary | ICD-10-CM | POA: Diagnosis not present

## 2017-04-28 DIAGNOSIS — E1139 Type 2 diabetes mellitus with other diabetic ophthalmic complication: Secondary | ICD-10-CM | POA: Diagnosis not present

## 2017-04-28 DIAGNOSIS — G6289 Other specified polyneuropathies: Secondary | ICD-10-CM | POA: Diagnosis not present

## 2017-04-30 DIAGNOSIS — Z1212 Encounter for screening for malignant neoplasm of rectum: Secondary | ICD-10-CM | POA: Diagnosis not present

## 2017-05-10 NOTE — Telephone Encounter (Signed)
Discussed with pt at ov with Dr. Gwenlyn Found on 9/11.

## 2017-06-28 ENCOUNTER — Other Ambulatory Visit: Payer: Self-pay

## 2017-06-28 ENCOUNTER — Ambulatory Visit (HOSPITAL_BASED_OUTPATIENT_CLINIC_OR_DEPARTMENT_OTHER): Payer: Medicare Other | Admitting: Hematology & Oncology

## 2017-06-28 ENCOUNTER — Encounter: Payer: Self-pay | Admitting: Hematology & Oncology

## 2017-06-28 ENCOUNTER — Other Ambulatory Visit (HOSPITAL_BASED_OUTPATIENT_CLINIC_OR_DEPARTMENT_OTHER): Payer: Medicare Other

## 2017-06-28 VITALS — BP 125/76 | HR 69 | Temp 98.0°F | Resp 18 | Wt 165.0 lb

## 2017-06-28 DIAGNOSIS — C9110 Chronic lymphocytic leukemia of B-cell type not having achieved remission: Secondary | ICD-10-CM | POA: Diagnosis not present

## 2017-06-28 DIAGNOSIS — IMO0001 Reserved for inherently not codable concepts without codable children: Secondary | ICD-10-CM

## 2017-06-28 LAB — CMP (CANCER CENTER ONLY)
ALBUMIN: 3.7 g/dL (ref 3.3–5.5)
ALT: 13 U/L (ref 10–47)
AST: 16 U/L (ref 11–38)
Alkaline Phosphatase: 74 U/L (ref 26–84)
BILIRUBIN TOTAL: 0.7 mg/dL (ref 0.20–1.60)
BUN: 15 mg/dL (ref 7–22)
CHLORIDE: 99 meq/L (ref 98–108)
CO2: 32 mEq/L (ref 18–33)
CREATININE: 1 mg/dL (ref 0.6–1.2)
Calcium: 9.2 mg/dL (ref 8.0–10.3)
Glucose, Bld: 278 mg/dL — ABNORMAL HIGH (ref 73–118)
Potassium: 4.5 mEq/L (ref 3.3–4.7)
SODIUM: 142 meq/L (ref 128–145)
TOTAL PROTEIN: 6.6 g/dL (ref 6.4–8.1)

## 2017-06-28 LAB — CBC WITH DIFFERENTIAL (CANCER CENTER ONLY)
BASO#: 0 10*3/uL (ref 0.0–0.2)
BASO%: 0.1 % (ref 0.0–2.0)
EOS ABS: 0 10*3/uL (ref 0.0–0.5)
EOS%: 0 % (ref 0.0–7.0)
HCT: 45.6 % (ref 38.7–49.9)
HEMOGLOBIN: 14.7 g/dL (ref 13.0–17.1)
LYMPH#: 20.6 10*3/uL — ABNORMAL HIGH (ref 0.9–3.3)
LYMPH%: 84.6 % — AB (ref 14.0–48.0)
MCH: 31.4 pg (ref 28.0–33.4)
MCHC: 32.2 g/dL (ref 32.0–35.9)
MCV: 97 fL (ref 82–98)
MONO#: 0.8 10*3/uL (ref 0.1–0.9)
MONO%: 3.1 % (ref 0.0–13.0)
NEUT%: 12.2 % — ABNORMAL LOW (ref 40.0–80.0)
NEUTROS ABS: 3 10*3/uL (ref 1.5–6.5)
PLATELETS: 80 10*3/uL — AB (ref 145–400)
RBC: 4.68 10*6/uL (ref 4.20–5.70)
RDW: 14.4 % (ref 11.1–15.7)
WBC: 24.3 10*3/uL — AB (ref 4.0–10.0)

## 2017-06-28 LAB — TECHNOLOGIST REVIEW CHCC SATELLITE

## 2017-06-28 LAB — LACTATE DEHYDROGENASE: LDH: 138 U/L (ref 125–245)

## 2017-06-28 NOTE — Progress Notes (Signed)
Hematology and Oncology Follow Up Visit  Lucas [name] [id number] January 29, 1941 76 y.o. 06/28/2017   Principle Diagnosis:  1. Chronic lymphocytic leukemia - stage C  Current Therapy:   Observation    Interim History:  Lucas [name] is here today for a follow-up.  As always, he has been over to [place].  He was over in [place] and [place].  He really had a good time.  He and his wife travel quite a bit.  Next spring, they will be going to [place] and [place]  I am so thankful that he can travel.  His CLL has really not bothered him.  His atrial fibrillation has been in very good control.  He will be spending Thanksgiving at his [facility].  He has had no problems with nausea or vomiting.  He has had no cough.  He said no shortness of breath.  He has had no change in bowel or bladder habits.  There is been no fever.  He has had no issues with infections.  Overall, his performance status is ECOG 0.    Medications:  Allergies as of 06/28/2017      Reactions   Other Anaphylaxis   Shellfish   Shellfish-derived Products Anaphylaxis   Adhesive [tape] Other (See Comments)   REDNESS   Shellfish Allergy Swelling   Latex Rash      Medication List        Accurate as of 06/28/17  9:45 AM. Always use your most recent med list.          aspirin EC 81 MG tablet Take 81 mg by mouth daily.   aspirin 81 MG tablet Take by mouth.   brimonidine 0.2 % ophthalmic solution Commonly known as:  ALPHAGAN Place 1 drop into the left eye 2 (two) times daily.   DUREZOL 0.05 % Emul Generic drug:  Difluprednate Apply to eye.   metFORMIN 500 MG tablet Commonly known as:  GLUCOPHAGE   metoprolol succinate 25 MG 24 hr tablet Commonly known as:  TOPROL-XL Take 0.5 tablets (12.5 mg total) by mouth daily.   sildenafil 50 MG tablet Commonly known as:  VIAGRA Take 1 tablet (50 mg total) by mouth daily as needed for erectile dysfunction.   timolol 0.5 % ophthalmic solution Commonly known  as:  TIMOPTIC Place 1 drop into the left eye daily. Left eye       Allergies:  Allergies  Allergen Reactions  . Other Anaphylaxis    Shellfish  . Shellfish-Derived Products Anaphylaxis  . Adhesive [Tape] Other (See Comments)    REDNESS  . Shellfish Allergy Swelling  . Latex Rash    Past Medical History, Surgical history, Social history, and Family History were reviewed and updated.  Review of Systems: As stated in the interim history  Physical Exam:  weight is 165 lb (74.8 kg). His oral temperature is 98 F (36.7 C). His blood pressure is 125/76 and his pulse is 69. His respiration is 18 and oxygen saturation is 97%.   Wt Readings from Last 3 Encounters:  06/28/17 165 lb (74.8 kg)  04/27/17 169 lb (76.7 kg)  04/14/17 168 lb (76.2 kg)    Well-developed well-nourished white male.  Head and neck exam shows no ocular or oral lesions.  I really cannot palpate any adenopathy in the neck.  Thyroid is not palpable.  Lungs are clear bilaterally.  Cardiac exam regular rate and rhythm.  I cannot detect any atrial fibrillation.  Abdomen is soft.  He has good  bowel sounds.  There is no fluid wave.  There is no palpable liver or spleen tip.  Axillary exam shows no bilateral axillary adenopathy.  Inguinal exam shows no inguinal adenopathy.  Extremities shows no clubbing, cyanosis or edema.  He has good range of motion of his joints.  Back exam shows no tenderness over the spine, ribs or hips.  Skin exam shows no rashes, ecchymoses or petechia.  Neurological exam shows no focal neurological deficits.   Lab Results  Component Value Date   WBC 24.3 (H) 06/28/2017   HGB 14.7 06/28/2017   HCT 45.6 06/28/2017   MCV 97 06/28/2017   PLT 80 (L) 06/28/2017   No results found for: FERRITIN, IRON, TIBC, UIBC, IRONPCTSAT Lab Results  Component Value Date   RETICCTPCT 1.0 06/15/2013   RBC 4.68 06/28/2017   RETICCTABS 47.6 06/15/2013   Lab Results  Component Value Date   KAPLAMBRATIO 1.83 (H)  10/31/2015   Lab Results  Component Value Date   IGGSERUM 895 01/25/2017   IGA 92 06/15/2013   IGMSERUM 113 01/25/2017   Lab Results  Component Value Date   TOTALPROTELP 6.5 06/15/2013   ALBUMINELP 61.7 06/15/2013   A1GS 3.5 06/15/2013   A2GS 9.8 06/15/2013   BETS 5.3 06/15/2013   BETA2SER 8.7 (H) 06/15/2013   GAMS 11.0 (L) 06/15/2013   MSPIKE Not Observed 01/25/2017   SPEI * 06/15/2013     Chemistry      Component Value Date/Time   NA 142 01/25/2017 0855   K 4.5 01/25/2017 0855   CL 103 01/25/2017 0855   CO2 32 01/25/2017 0855   BUN 17 01/25/2017 0855   CREATININE 1.0 01/25/2017 0855      Component Value Date/Time   CALCIUM 8.8 01/25/2017 0855   ALKPHOS 60 01/25/2017 0855   AST 19 01/25/2017 0855   ALT 18 01/25/2017 0855   BILITOT 1.00 01/25/2017 0855     Impression and Plan: Lucas [name] is very pleasant 76 year old male with CLL. We have not had to treat him now for over 15 years.   I suspect that he might need therapy within the year or so.  I think we can get him back in 6 months now.  I think this would be very reasonable.  He always knows that he can come back sooner if he has any problems.  Volanda Napoleon, MD 11/12/20189:45 AM

## 2017-07-02 DIAGNOSIS — H40043 Steroid responder, bilateral: Secondary | ICD-10-CM | POA: Diagnosis not present

## 2017-07-07 ENCOUNTER — Other Ambulatory Visit: Payer: Self-pay | Admitting: Cardiovascular Disease

## 2017-07-07 NOTE — Telephone Encounter (Signed)
REFILL 

## 2017-09-08 DIAGNOSIS — J302 Other seasonal allergic rhinitis: Secondary | ICD-10-CM | POA: Diagnosis not present

## 2017-09-08 DIAGNOSIS — C9100 Acute lymphoblastic leukemia not having achieved remission: Secondary | ICD-10-CM | POA: Diagnosis not present

## 2017-09-08 DIAGNOSIS — N529 Male erectile dysfunction, unspecified: Secondary | ICD-10-CM | POA: Diagnosis not present

## 2017-09-08 DIAGNOSIS — Z6822 Body mass index (BMI) 22.0-22.9, adult: Secondary | ICD-10-CM | POA: Diagnosis not present

## 2017-09-08 DIAGNOSIS — N401 Enlarged prostate with lower urinary tract symptoms: Secondary | ICD-10-CM | POA: Diagnosis not present

## 2017-09-08 DIAGNOSIS — E1139 Type 2 diabetes mellitus with other diabetic ophthalmic complication: Secondary | ICD-10-CM | POA: Diagnosis not present

## 2017-09-08 DIAGNOSIS — Z1389 Encounter for screening for other disorder: Secondary | ICD-10-CM | POA: Diagnosis not present

## 2017-09-08 DIAGNOSIS — E7849 Other hyperlipidemia: Secondary | ICD-10-CM | POA: Diagnosis not present

## 2017-09-08 DIAGNOSIS — E1149 Type 2 diabetes mellitus with other diabetic neurological complication: Secondary | ICD-10-CM | POA: Diagnosis not present

## 2017-09-08 DIAGNOSIS — I4891 Unspecified atrial fibrillation: Secondary | ICD-10-CM | POA: Diagnosis not present

## 2017-09-08 DIAGNOSIS — G629 Polyneuropathy, unspecified: Secondary | ICD-10-CM | POA: Diagnosis not present

## 2017-09-24 DIAGNOSIS — L570 Actinic keratosis: Secondary | ICD-10-CM | POA: Diagnosis not present

## 2017-09-24 DIAGNOSIS — L821 Other seborrheic keratosis: Secondary | ICD-10-CM | POA: Diagnosis not present

## 2017-09-24 DIAGNOSIS — B359 Dermatophytosis, unspecified: Secondary | ICD-10-CM | POA: Diagnosis not present

## 2017-09-24 DIAGNOSIS — D225 Melanocytic nevi of trunk: Secondary | ICD-10-CM | POA: Diagnosis not present

## 2017-09-24 DIAGNOSIS — Z23 Encounter for immunization: Secondary | ICD-10-CM | POA: Diagnosis not present

## 2017-11-11 ENCOUNTER — Encounter: Payer: Self-pay | Admitting: Cardiovascular Disease

## 2017-11-11 MED ORDER — METOPROLOL SUCCINATE ER 25 MG PO TB24: 12.5000 mg | ORAL_TABLET | Freq: Every day | ORAL | 2 refills | Status: DC

## 2017-12-15 ENCOUNTER — Encounter: Payer: Self-pay | Admitting: Hematology & Oncology

## 2017-12-15 ENCOUNTER — Inpatient Hospital Stay: Payer: Medicare Other | Attending: Hematology & Oncology | Admitting: Hematology & Oncology

## 2017-12-15 ENCOUNTER — Other Ambulatory Visit: Payer: Self-pay

## 2017-12-15 ENCOUNTER — Inpatient Hospital Stay: Payer: Medicare Other

## 2017-12-15 VITALS — BP 124/68 | HR 74 | Temp 98.2°F | Resp 20 | Wt 168.8 lb

## 2017-12-15 DIAGNOSIS — C9110 Chronic lymphocytic leukemia of B-cell type not having achieved remission: Secondary | ICD-10-CM | POA: Insufficient documentation

## 2017-12-15 DIAGNOSIS — IMO0001 Reserved for inherently not codable concepts without codable children: Secondary | ICD-10-CM

## 2017-12-15 LAB — CMP (CANCER CENTER ONLY)
ALBUMIN: 4.1 g/dL (ref 3.5–5.0)
ALT: 10 U/L (ref 0–55)
AST: 15 U/L (ref 5–34)
Alkaline Phosphatase: 69 U/L (ref 40–150)
Anion gap: 7 (ref 3–11)
BILIRUBIN TOTAL: 0.6 mg/dL (ref 0.2–1.2)
BUN: 18 mg/dL (ref 7–26)
CO2: 30 mmol/L — AB (ref 22–29)
CREATININE: 1.12 mg/dL (ref 0.70–1.30)
Calcium: 9.1 mg/dL (ref 8.4–10.4)
Chloride: 102 mmol/L (ref 98–109)
GFR, Est AFR Am: >60 mL/min (ref >60)
GFR, Estimated: >60 mL/min (ref >60)
GLUCOSE: 280 mg/dL — AB (ref 70–140)
Potassium: 4.7 mmol/L (ref 3.5–5.1)
SODIUM: 139 mmol/L (ref 136–145)
Total Protein: 6.9 g/dL (ref 6.4–8.3)

## 2017-12-15 LAB — CBC WITH DIFFERENTIAL (CANCER CENTER ONLY)
BASOS ABS: 0 10*3/uL (ref 0.0–0.1)
BASOS PCT: 0 %
EOS ABS: 0 10*3/uL (ref 0.0–0.5)
Eosinophils Relative: 0 %
HEMATOCRIT: 45.8 % (ref 38.7–49.9)
HEMOGLOBIN: 14.7 g/dL (ref 13.0–17.1)
LYMPHS ABS: 18.9 10*3/uL — AB (ref 0.9–3.3)
Lymphocytes Relative: 86 %
MCH: 31.1 pg (ref 28.0–33.4)
MCHC: 32.1 g/dL (ref 32.0–35.9)
MCV: 96.8 fL (ref 82.0–98.0)
Monocytes Absolute: 0.3 10*3/uL (ref 0.1–0.9)
Monocytes Relative: 1 %
NEUTROS ABS: 2.8 10*3/uL (ref 1.5–6.5)
NEUTROS PCT: 13 %
Platelet Count: 95 10*3/uL — ABNORMAL LOW (ref 145–400)
RBC: 4.73 MIL/uL (ref 4.20–5.70)
RDW: 14.8 % (ref 11.1–15.7)
WBC Count: 21.9 10*3/uL — ABNORMAL HIGH (ref 4.0–10.0)

## 2017-12-15 LAB — LACTATE DEHYDROGENASE: LDH: 138 U/L (ref 125–245)

## 2017-12-15 NOTE — Progress Notes (Signed)
Hematology and Oncology Follow Up Visit  Lucas [name] [id number] 06/26/1941 77 y.o. 12/15/2017   Principle Diagnosis:  1. Chronic lymphocytic leukemia - stage C  Current Therapy:   Observation    Interim History:  Lucas [name] is here today for a follow-up.  As always, he is now ready to go over to [place].  He will will be going to [place] and [place].  He feels great.  He has had no problems.  His heart has been doing okay.  He has had no palpitations.  He has had no cough or shortness of breath.  He has had no swollen lymph nodes.  He has had no fever.  There is been no bleeding.  Overall, his performance status is ECOG 0.    Medications:  Allergies as of 12/15/2017      Reactions   Other Anaphylaxis   Shellfish   Shellfish-derived Products Anaphylaxis   Adhesive [tape] Other (See Comments)   REDNESS   Shellfish Allergy Swelling   Latex Rash      Medication List        Accurate as of 12/15/17  9:31 AM. Always use your most recent med list.          aspirin EC 81 MG tablet Take 81 mg by mouth daily.   aspirin 81 MG tablet Take by mouth.   brimonidine 0.2 % ophthalmic solution Commonly known as:  ALPHAGAN Place 1 drop into the left eye 2 (two) times daily.   DUREZOL 0.05 % Emul Generic drug:  Difluprednate Apply to eye.   metFORMIN 500 MG tablet Commonly known as:  GLUCOPHAGE 500 mg daily.   metoprolol succinate 25 MG 24 hr tablet Commonly known as:  TOPROL-XL Take 0.5 tablets (12.5 mg total) by mouth daily.   sildenafil 50 MG tablet Commonly known as:  VIAGRA Take 1 tablet (50 mg total) by mouth daily as needed for erectile dysfunction.   timolol 0.5 % ophthalmic solution Commonly known as:  TIMOPTIC Place 1 drop into the left eye daily. Left eye       Allergies:  Allergies  Allergen Reactions  . Other Anaphylaxis    Shellfish  . Shellfish-Derived Products Anaphylaxis  . Adhesive [Tape] Other (See Comments)    REDNESS  . Shellfish  Allergy Swelling  . Latex Rash    Past Medical History, Surgical history, Social history, and Family History were reviewed and updated.  Review of Systems: Review of Systems  Constitutional: Negative.   HENT: Negative.   Eyes: Negative.   Respiratory: Negative.   Cardiovascular: Negative.   Gastrointestinal: Negative.   Genitourinary: Negative.   Musculoskeletal: Negative.   Skin: Negative.   Neurological: Negative.   Endo/Heme/Allergies: Negative.   Psychiatric/Behavioral: Negative.      Physical Exam:  weight is 168 lb 12.8 oz (76.6 kg). His oral temperature is 98.2 F (36.8 C). His blood pressure is 124/68 and his pulse is 74. His respiration is 20 and oxygen saturation is 96%.   Wt Readings from Last 3 Encounters:  12/15/17 168 lb 12.8 oz (76.6 kg)  06/28/17 165 lb (74.8 kg)  04/27/17 169 lb (76.7 kg)    Physical Exam  Constitutional: He is oriented to person, place, and time.  HENT:  Head: Normocephalic and atraumatic.  Mouth/Throat: Oropharynx is clear and moist.  Eyes: Pupils are equal, round, and reactive to light. EOM are normal.  Neck: Normal range of motion.  Cardiovascular: Normal rate, regular rhythm and normal heart sounds.  Pulmonary/Chest: Effort normal and breath sounds normal.  Abdominal: Soft. Bowel sounds are normal.  Musculoskeletal: Normal range of motion. He exhibits no edema, tenderness or deformity.  Lymphadenopathy:    He has no cervical adenopathy.  Neurological: He is alert and oriented to person, place, and time.  Skin: Skin is warm and dry. No rash noted. No erythema.  Psychiatric: He has a normal mood and affect. His behavior is normal. Judgment and thought content normal.  Vitals reviewed.    Lab Results  Component Value Date   WBC 21.9 (H) 12/15/2017   HGB 14.7 12/15/2017   HCT 45.8 12/15/2017   MCV 96.8 12/15/2017   PLT 95 (L) 12/15/2017   No results found for: FERRITIN, IRON, TIBC, UIBC, IRONPCTSAT Lab Results    Component Value Date   RETICCTPCT 1.0 06/15/2013   RBC 4.73 12/15/2017   RETICCTABS 47.6 06/15/2013   Lab Results  Component Value Date   KAPLAMBRATIO 1.83 (H) 10/31/2015   Lab Results  Component Value Date   IGGSERUM 895 01/25/2017   IGA 92 06/15/2013   IGMSERUM 113 01/25/2017   Lab Results  Component Value Date   TOTALPROTELP 6.5 06/15/2013   ALBUMINELP 61.7 06/15/2013   A1GS 3.5 06/15/2013   A2GS 9.8 06/15/2013   BETS 5.3 06/15/2013   BETA2SER 8.7 (H) 06/15/2013   GAMS 11.0 (L) 06/15/2013   MSPIKE Not Observed 01/25/2017   SPEI * 06/15/2013     Chemistry      Component Value Date/Time   NA 142 06/28/2017 0855   K 4.5 06/28/2017 0855   CL 99 06/28/2017 0855   CO2 32 06/28/2017 0855   BUN 15 06/28/2017 0855   CREATININE 1.0 06/28/2017 0855      Component Value Date/Time   CALCIUM 9.2 06/28/2017 0855   ALKPHOS 74 06/28/2017 0855   AST 16 06/28/2017 0855   ALT 13 06/28/2017 0855   BILITOT 0.70 06/28/2017 0855     Impression and Plan: [name] is very pleasant 77 year old male with CLL. We have not had to treat him now for 20 years.   I think that we can now get him back in 7-8 months.  I am just incredibly impressed that we have yet to have to treat him.  His white cell count keeps improving.  I do not see any problems with him going over to [place] for this trip.  He and his wife love to travel.  They really have a great time traveling.     Volanda Napoleon, MD 5/1/20199:31 AM

## 2017-12-16 LAB — BETA 2 MICROGLOBULIN, SERUM: Beta-2 Microglobulin: 1.6 mg/L (ref 0.6–2.4)

## 2018-01-19 DIAGNOSIS — E1149 Type 2 diabetes mellitus with other diabetic neurological complication: Secondary | ICD-10-CM | POA: Diagnosis not present

## 2018-01-19 DIAGNOSIS — C9100 Acute lymphoblastic leukemia not having achieved remission: Secondary | ICD-10-CM | POA: Diagnosis not present

## 2018-01-19 DIAGNOSIS — I4891 Unspecified atrial fibrillation: Secondary | ICD-10-CM | POA: Diagnosis not present

## 2018-01-19 DIAGNOSIS — E1139 Type 2 diabetes mellitus with other diabetic ophthalmic complication: Secondary | ICD-10-CM | POA: Diagnosis not present

## 2018-01-19 DIAGNOSIS — H9193 Unspecified hearing loss, bilateral: Secondary | ICD-10-CM | POA: Diagnosis not present

## 2018-01-19 DIAGNOSIS — M25562 Pain in left knee: Secondary | ICD-10-CM | POA: Diagnosis not present

## 2018-01-19 DIAGNOSIS — N528 Other male erectile dysfunction: Secondary | ICD-10-CM | POA: Diagnosis not present

## 2018-01-19 DIAGNOSIS — Z6822 Body mass index (BMI) 22.0-22.9, adult: Secondary | ICD-10-CM | POA: Diagnosis not present

## 2018-01-19 DIAGNOSIS — G6289 Other specified polyneuropathies: Secondary | ICD-10-CM | POA: Diagnosis not present

## 2018-01-19 DIAGNOSIS — E7849 Other hyperlipidemia: Secondary | ICD-10-CM | POA: Diagnosis not present

## 2018-01-19 DIAGNOSIS — H3093 Unspecified chorioretinal inflammation, bilateral: Secondary | ICD-10-CM | POA: Diagnosis not present

## 2018-01-19 DIAGNOSIS — N401 Enlarged prostate with lower urinary tract symptoms: Secondary | ICD-10-CM | POA: Diagnosis not present

## 2018-02-14 DIAGNOSIS — L570 Actinic keratosis: Secondary | ICD-10-CM | POA: Diagnosis not present

## 2018-05-03 DIAGNOSIS — Z85828 Personal history of other malignant neoplasm of skin: Secondary | ICD-10-CM | POA: Diagnosis not present

## 2018-05-03 DIAGNOSIS — D485 Neoplasm of uncertain behavior of skin: Secondary | ICD-10-CM | POA: Diagnosis not present

## 2018-05-03 DIAGNOSIS — L570 Actinic keratosis: Secondary | ICD-10-CM | POA: Diagnosis not present

## 2018-05-03 DIAGNOSIS — D045 Carcinoma in situ of skin of trunk: Secondary | ICD-10-CM | POA: Diagnosis not present

## 2018-05-03 DIAGNOSIS — L910 Hypertrophic scar: Secondary | ICD-10-CM | POA: Diagnosis not present

## 2018-05-03 DIAGNOSIS — D2261 Melanocytic nevi of right upper limb, including shoulder: Secondary | ICD-10-CM | POA: Diagnosis not present

## 2018-05-03 DIAGNOSIS — L820 Inflamed seborrheic keratosis: Secondary | ICD-10-CM | POA: Diagnosis not present

## 2018-05-03 DIAGNOSIS — Z808 Family history of malignant neoplasm of other organs or systems: Secondary | ICD-10-CM | POA: Diagnosis not present

## 2018-05-03 DIAGNOSIS — B353 Tinea pedis: Secondary | ICD-10-CM | POA: Diagnosis not present

## 2018-05-03 DIAGNOSIS — C44222 Squamous cell carcinoma of skin of right ear and external auricular canal: Secondary | ICD-10-CM | POA: Diagnosis not present

## 2018-05-11 DIAGNOSIS — R82998 Other abnormal findings in urine: Secondary | ICD-10-CM | POA: Diagnosis not present

## 2018-05-11 DIAGNOSIS — E1149 Type 2 diabetes mellitus with other diabetic neurological complication: Secondary | ICD-10-CM | POA: Diagnosis not present

## 2018-05-11 DIAGNOSIS — Z125 Encounter for screening for malignant neoplasm of prostate: Secondary | ICD-10-CM | POA: Diagnosis not present

## 2018-05-11 DIAGNOSIS — E7849 Other hyperlipidemia: Secondary | ICD-10-CM | POA: Diagnosis not present

## 2018-05-27 DIAGNOSIS — G629 Polyneuropathy, unspecified: Secondary | ICD-10-CM | POA: Diagnosis not present

## 2018-05-27 DIAGNOSIS — Z0000 Encounter for general adult medical examination without abnormal findings: Secondary | ICD-10-CM | POA: Diagnosis not present

## 2018-05-27 DIAGNOSIS — Z23 Encounter for immunization: Secondary | ICD-10-CM | POA: Diagnosis not present

## 2018-05-27 DIAGNOSIS — M25562 Pain in left knee: Secondary | ICD-10-CM | POA: Diagnosis not present

## 2018-05-27 DIAGNOSIS — C9100 Acute lymphoblastic leukemia not having achieved remission: Secondary | ICD-10-CM | POA: Diagnosis not present

## 2018-05-27 DIAGNOSIS — E7849 Other hyperlipidemia: Secondary | ICD-10-CM | POA: Diagnosis not present

## 2018-05-27 DIAGNOSIS — N401 Enlarged prostate with lower urinary tract symptoms: Secondary | ICD-10-CM | POA: Diagnosis not present

## 2018-05-27 DIAGNOSIS — Z6822 Body mass index (BMI) 22.0-22.9, adult: Secondary | ICD-10-CM | POA: Diagnosis not present

## 2018-05-27 DIAGNOSIS — I4891 Unspecified atrial fibrillation: Secondary | ICD-10-CM | POA: Diagnosis not present

## 2018-05-27 DIAGNOSIS — E1139 Type 2 diabetes mellitus with other diabetic ophthalmic complication: Secondary | ICD-10-CM | POA: Diagnosis not present

## 2018-05-27 DIAGNOSIS — Z1389 Encounter for screening for other disorder: Secondary | ICD-10-CM | POA: Diagnosis not present

## 2018-05-27 DIAGNOSIS — E1149 Type 2 diabetes mellitus with other diabetic neurological complication: Secondary | ICD-10-CM | POA: Diagnosis not present

## 2018-06-02 DIAGNOSIS — L570 Actinic keratosis: Secondary | ICD-10-CM | POA: Diagnosis not present

## 2018-06-02 DIAGNOSIS — Z23 Encounter for immunization: Secondary | ICD-10-CM | POA: Diagnosis not present

## 2018-06-02 DIAGNOSIS — D045 Carcinoma in situ of skin of trunk: Secondary | ICD-10-CM | POA: Diagnosis not present

## 2018-06-02 DIAGNOSIS — Z1212 Encounter for screening for malignant neoplasm of rectum: Secondary | ICD-10-CM | POA: Diagnosis not present

## 2018-06-06 DIAGNOSIS — C44222 Squamous cell carcinoma of skin of right ear and external auricular canal: Secondary | ICD-10-CM | POA: Diagnosis not present

## 2018-06-07 ENCOUNTER — Ambulatory Visit (INDEPENDENT_AMBULATORY_CARE_PROVIDER_SITE_OTHER): Payer: Medicare Other | Admitting: Cardiovascular Disease

## 2018-06-07 ENCOUNTER — Encounter: Payer: Self-pay | Admitting: Cardiovascular Disease

## 2018-06-07 VITALS — BP 116/62 | HR 65 | Ht 72.0 in | Wt 168.0 lb

## 2018-06-07 DIAGNOSIS — I480 Paroxysmal atrial fibrillation: Secondary | ICD-10-CM

## 2018-06-07 NOTE — Assessment & Plan Note (Signed)
History of paroxysmal atrial fibrillation currently not on oral anticoagulant agent because of a CHAD2VASaSc score of 1.  He had 2 episodes in the last year, one on 04/09/2018 while in [place] and one on 05/09/2018.  These are all self-limited.

## 2018-06-07 NOTE — Patient Instructions (Signed)
Medication Instructions:  Your physician recommends that you continue on your current medications as directed. Please refer to the Current Medication list given to you today.  If you need a refill on your cardiac medications before your next appointment, please call your pharmacy.   Lab work: none If you have labs (blood work) drawn today and your tests are completely normal, you will receive your results only by: . MyChart Message (if you have MyChart) OR . A paper copy in the mail If you have any lab test that is abnormal or we need to change your treatment, we will call you to review the results.  Testing/Procedures: none  Follow-Up: At [facility], you and your health needs are our priority.  As part of our continuing mission to provide you with exceptional heart care, we have created designated Provider Care Teams.  These Care Teams include your primary Cardiologist (physician) and Advanced Practice Providers (APPs -  Physician Assistants and Nurse Practitioners) who all work together to provide you with the care you need, when you need it. You will need a follow up appointment in 12 months.  Please call our office 2 months in advance to schedule this appointment.  You may see Dr. Berry or one of the following Advanced Practice Providers on your designated Care Team:   Luke Kilroy, PA-C Krista Kroeger, PA-C . Callie Goodrich, PA-C  Any Other Special Instructions Will Be Listed Below (If Applicable).    

## 2018-06-07 NOTE — Progress Notes (Signed)
06/07/2018 Lucas [name]   1941-07-04  [id number]  Primary Physician Burnard Bunting, MD Primary Cardiologist: [name] MD FACP, [place], [place], [place]  HPI:  Lucas [name] is a 77 y.o.  thin and fit-appearing, married Caucasian male, father to 74 stepchild, grandfather to 2 step-grandchildren who I last saw in the office  04/27/2017. He had diagnostic cath performed by me in January after a positive Myoview that revealed normal coronary arteries, normal LV function suggesting a false positive test. He has PAF with RVR in the past with atypical chest pain. He also has CLL which is quiescent followed by Dr. [name]. He did have a 3-week photo shoot safari in [place] in the [place] after I cath'd him and has taken [employer]. He is otherwise active and asymptomatic. His most recent lab work performed in January revealed total cholesterol 168, LDL 107, HDL 44. He was admitted to [facility] ER on 06/27/13 by Dr. [name] for A. Fib. Plans were to convert him with oral flecainide however he converted on his own. He was seen in the [facility] ER by Dr. [name]. Since I saw him in the office one year ago he has had one episode of PAF in March of 2015while he was in [place]. His travel plans in the upcoming future included[place], [place], [place] in [place] in May followed by [place] in September. he was recently in [place] and had an episode of PAF on July 23 well on a train and a dining car. His most recent episode was on 04/13/17 after being woken up from a nap with rapid heart rate. This took 5 hours to spontaneously convert.  Since I saw him back a year ago his travels continue his travels continue to [place] in [place] this past May, [place] in [place] in August.  He did have an episode of PAF on 04/09/2018 which was short-lived in [place] and one on 05/09/2018.  He is otherwise asymptomatic.   Current Meds  Medication Sig  . aspirin 81 MG tablet Take by  mouth.  . brimonidine (ALPHAGAN) 0.2 % ophthalmic solution Place 1 drop into the left eye 2 (two) times daily.   . metFORMIN (GLUCOPHAGE) 500 MG tablet 500 mg daily.   . metoprolol succinate (TOPROL-XL) 25 MG 24 hr tablet Take 0.5 tablets (12.5 mg total) by mouth daily.  . sildenafil (VIAGRA) 50 MG tablet Take 1 tablet (50 mg total) by mouth daily as needed for erectile dysfunction.  . timolol (TIMOPTIC) 0.5 % ophthalmic solution Place 1 drop into the left eye daily. Left eye  . [DISCONTINUED] Difluprednate (DUREZOL) 0.05 % EMUL Apply to eye.     Allergies  Allergen Reactions  . Other Anaphylaxis    Shellfish  . Shellfish-Derived Products Anaphylaxis  . Adhesive [Tape] Other (See Comments)    REDNESS  . Shellfish Allergy Swelling  . Latex Rash    Social History   Socioeconomic History  . Marital status: Married    Spouse name: Not on file  . Number of children: 1  . Years of education: Not on file  . Highest education level: Not on file  Occupational History    Employer: RETIRED  Social Needs  . Financial resource strain: Not on file  . Food insecurity:    Worry: Not on file    Inability: Not on file  . Transportation needs:    Medical: Not on file    Non-medical: Not on file  Tobacco Use  .  Smoking status: Former Smoker    Packs/day: 1.00    Years: 3.00    Pack years: 3.00    Types: Cigarettes    Start date: 09/19/1962    Last attempt to quit: 12/17/1965    Years since quitting: 52.5  . Smokeless tobacco: Never Used  . Tobacco comment: quit 47 yeras ago  Substance and Sexual Activity  . Alcohol use: Yes    Alcohol/week: 4.0 standard drinks    Types: 4 Glasses of wine per week  . Drug use: No  . Sexual activity: Not on file  Lifestyle  . Physical activity:    Days per week: Not on file    Minutes per session: Not on file  . Stress: Not on file  Relationships  . Social connections:    Talks on phone: Not on file    Gets together: Not on file    Attends  religious service: Not on file    Active member of club or organization: Not on file    Attends meetings of clubs or organizations: Not on file    Relationship status: Not on file  . Intimate partner violence:    Fear of current or ex partner: Not on file    Emotionally abused: Not on file    Physically abused: Not on file    Forced sexual activity: Not on file  Other Topics Concern  . Not on file  Social History Narrative   Married Caucasian male, father to 54 stepchild, grandfather to 2 step-grandchildren.   Lives with his wife.   Travels a lot & enjoys photography.     Review of Systems: General: negative for chills, fever, night sweats or weight changes.  Cardiovascular: negative for chest pain, dyspnea on exertion, edema, orthopnea, palpitations, paroxysmal nocturnal dyspnea or shortness of breath Dermatological: negative for rash Respiratory: negative for cough or wheezing Urologic: negative for hematuria Abdominal: negative for nausea, vomiting, diarrhea, bright red blood per rectum, melena, or hematemesis Neurologic: negative for visual changes, syncope, or dizziness All other systems reviewed and are otherwise negative except as noted above.    Blood pressure 116/62, pulse 65, height 6' (1.829 m), weight 168 lb (76.2 kg).  General appearance: alert and no distress Neck: no adenopathy, no carotid bruit, no JVD, supple, symmetrical, trachea midline and thyroid not enlarged, symmetric, no tenderness/mass/nodules Lungs: clear to auscultation bilaterally Heart: regular rate and rhythm, S1, S2 normal, no murmur, click, rub or gallop Extremities: extremities normal, atraumatic, no cyanosis or edema Pulses: 2+ and symmetric Skin: Skin color, texture, turgor normal. No rashes or lesions Neurologic: Alert and oriented X 3, normal strength and tone. Normal symmetric reflexes. Normal coordination and gait  EKG sinus rhythm at 65 without ST or T wave changes.  I personally reviewed  this EKG  ASSESSMENT AND PLAN:   Paroxysmal atrial fibrillation (HCC) History of paroxysmal atrial fibrillation currently not on oral anticoagulant agent because of a CHAD2VASaSc score of 1.  He had 2 episodes in the last year, one on 04/09/2018 while in [place] and one on 05/09/2018.  These are all self-limited.      [name] MD FACP,FACC,FAHA, [facility] 06/07/2018 9:19 AM

## 2018-07-08 DIAGNOSIS — H40043 Steroid responder, bilateral: Secondary | ICD-10-CM | POA: Diagnosis not present

## 2018-07-08 DIAGNOSIS — H4089 Other specified glaucoma: Secondary | ICD-10-CM | POA: Diagnosis not present

## 2018-07-20 ENCOUNTER — Inpatient Hospital Stay: Payer: Medicare Other

## 2018-07-20 ENCOUNTER — Inpatient Hospital Stay: Payer: Medicare Other | Attending: Hematology & Oncology | Admitting: Hematology & Oncology

## 2018-07-20 VITALS — BP 114/70 | HR 61 | Temp 97.8°F | Resp 17 | Wt 167.5 lb

## 2018-07-20 DIAGNOSIS — IMO0001 Reserved for inherently not codable concepts without codable children: Secondary | ICD-10-CM

## 2018-07-20 DIAGNOSIS — C9110 Chronic lymphocytic leukemia of B-cell type not having achieved remission: Secondary | ICD-10-CM | POA: Diagnosis not present

## 2018-07-20 LAB — CBC WITH DIFFERENTIAL (CANCER CENTER ONLY)
ABS IMMATURE GRANULOCYTES: 0.03 10*3/uL (ref 0.00–0.07)
BASOS PCT: 0 %
Basophils Absolute: 0.1 10*3/uL (ref 0.0–0.1)
EOS ABS: 0 10*3/uL (ref 0.0–0.5)
Eosinophils Relative: 0 %
HCT: 50.1 % (ref 39.0–52.0)
Hemoglobin: 15.5 g/dL (ref 13.0–17.0)
Immature Granulocytes: 0 %
Lymphocytes Relative: 87 %
Lymphs Abs: 22.4 10*3/uL — ABNORMAL HIGH (ref 0.7–4.0)
MCH: 30 pg (ref 26.0–34.0)
MCHC: 30.9 g/dL (ref 30.0–36.0)
MCV: 97.1 fL (ref 80.0–100.0)
MONO ABS: 0.5 10*3/uL (ref 0.1–1.0)
MONOS PCT: 2 %
NRBC: 0 % (ref 0.0–0.2)
Neutro Abs: 2.7 10*3/uL (ref 1.7–7.7)
Neutrophils Relative %: 11 %
Platelet Count: 99 10*3/uL — ABNORMAL LOW (ref 150–400)
RBC: 5.16 MIL/uL (ref 4.22–5.81)
RDW: 13.8 % (ref 11.5–15.5)
WBC: 25.7 10*3/uL — AB (ref 4.0–10.5)

## 2018-07-20 LAB — CMP (CANCER CENTER ONLY)
ALT: 20 U/L (ref 0–44)
AST: 19 U/L (ref 15–41)
Albumin: 4.4 g/dL (ref 3.5–5.0)
Alkaline Phosphatase: 68 U/L (ref 38–126)
Anion gap: 5 (ref 5–15)
BILIRUBIN TOTAL: 0.9 mg/dL (ref 0.3–1.2)
BUN: 15 mg/dL (ref 8–23)
CO2: 33 mmol/L — ABNORMAL HIGH (ref 22–32)
CREATININE: 0.98 mg/dL (ref 0.61–1.24)
Calcium: 9.1 mg/dL (ref 8.9–10.3)
Chloride: 101 mmol/L (ref 98–111)
Glucose, Bld: 178 mg/dL — ABNORMAL HIGH (ref 70–99)
Potassium: 4.8 mmol/L (ref 3.5–5.1)
Sodium: 139 mmol/L (ref 135–145)
Total Protein: 6.8 g/dL (ref 6.5–8.1)

## 2018-07-20 LAB — SAVE SMEAR (SSMR)

## 2018-07-20 LAB — LACTATE DEHYDROGENASE: LDH: 136 U/L (ref 98–192)

## 2018-07-20 NOTE — Progress Notes (Signed)
Hematology and Oncology Follow Up Visit  Lucas [name] [id number] [birth year] 77 y.o. 07/20/2018   Principle Diagnosis:  1. Chronic lymphocytic leukemia - stage C  Current Therapy:   Observation    Interim History:  Lucas [name] is here today for a follow-up.  He is as always, he had a great time on his recent trip.  He went to [place] in [place].  I have been the beneficiary of some stamps that he brought me.  I am incredibly humbled by this.    His next trip will be down to [place] in the spring.  He will then go to [place] in the summer.  He is doing well.  He has had no problems with his heart.  He has had no problems with his fibrillation.  He has not noted any swollen lymph nodes.  He has had no problems with fever.  He has had no change in bowel or bladder habits.  He has had no rashes.    Overall, his performance status is ECOG 0.    Medications:  Allergies as of 07/20/2018      Reactions   Other Anaphylaxis   Shellfish   Shellfish-derived Products Anaphylaxis   Adhesive [tape] Other (See Comments)   REDNESS   Shellfish Allergy Swelling   Latex Rash      Medication List        Accurate as of 07/20/18 10:29 AM. Always use your most recent med list.          aspirin 81 MG tablet Take by mouth.   brimonidine 0.2 % ophthalmic solution Commonly known as:  ALPHAGAN Place 1 drop into the left eye 2 (two) times daily.   metFORMIN 500 MG tablet Commonly known as:  GLUCOPHAGE 500 mg daily.   metoprolol succinate 25 MG 24 hr tablet Commonly known as:  TOPROL-XL Take 0.5 tablets (12.5 mg total) by mouth daily.   sildenafil 50 MG tablet Commonly known as:  VIAGRA Take 1 tablet (50 mg total) by mouth daily as needed for erectile dysfunction.   timolol 0.5 % ophthalmic solution Commonly known as:  TIMOPTIC Place 1 drop into the left eye daily. Left eye       Allergies:  Allergies  Allergen Reactions  . Other Anaphylaxis    Shellfish  .  Shellfish-Derived Products Anaphylaxis  . Adhesive [Tape] Other (See Comments)    REDNESS  . Shellfish Allergy Swelling  . Latex Rash    Past Medical History, Surgical history, Social history, and Family History were reviewed and updated.  Review of Systems: Review of Systems  Constitutional: Negative.   HENT: Negative.   Eyes: Negative.   Respiratory: Negative.   Cardiovascular: Negative.   Gastrointestinal: Negative.   Genitourinary: Negative.   Musculoskeletal: Negative.   Skin: Negative.   Neurological: Negative.   Endo/Heme/Allergies: Negative.   Psychiatric/Behavioral: Negative.      Physical Exam:  weight is 167 lb 8 oz (76 kg). His oral temperature is 97.8 F (36.6 C). His blood pressure is 114/70 and his pulse is 61. His respiration is 17 and oxygen saturation is 97%.   Wt Readings from Last 3 Encounters:  07/20/18 167 lb 8 oz (76 kg)  06/07/18 168 lb (76.2 kg)  12/15/17 168 lb 12.8 oz (76.6 kg)    Physical Exam  Constitutional: He is oriented to person, place, and time.  HENT:  Head: Normocephalic and atraumatic.  Mouth/Throat: Oropharynx is clear and moist.  Eyes: Pupils are equal,  round, and reactive to light. EOM are normal.  Neck: Normal range of motion.  Cardiovascular: Normal rate, regular rhythm and normal heart sounds.  Pulmonary/Chest: Effort normal and breath sounds normal.  Abdominal: Soft. Bowel sounds are normal.  Musculoskeletal: Normal range of motion. He exhibits no edema, tenderness or deformity.  Lymphadenopathy:    He has no cervical adenopathy.  Neurological: He is alert and oriented to person, place, and time.  Skin: Skin is warm and dry. No rash noted. No erythema.  Psychiatric: He has a normal mood and affect. His behavior is normal. Judgment and thought content normal.  Vitals reviewed.    Lab Results  Component Value Date   WBC 25.7 (H) 07/20/2018   HGB 15.5 07/20/2018   HCT 50.1 07/20/2018   MCV 97.1 07/20/2018   PLT 99  (L) 07/20/2018   No results found for: FERRITIN, IRON, TIBC, UIBC, IRONPCTSAT Lab Results  Component Value Date   RETICCTPCT 1.0 06/15/2013   RBC 5.16 07/20/2018   RETICCTABS 47.6 06/15/2013   Lab Results  Component Value Date   KAPLAMBRATIO 1.83 (H) 10/31/2015   Lab Results  Component Value Date   IGGSERUM 895 01/25/2017   IGA 92 06/15/2013   IGMSERUM 113 01/25/2017   Lab Results  Component Value Date   TOTALPROTELP 6.5 06/15/2013   ALBUMINELP 61.7 06/15/2013   A1GS 3.5 06/15/2013   A2GS 9.8 06/15/2013   BETS 5.3 06/15/2013   BETA2SER 8.7 (H) 06/15/2013   GAMS 11.0 (L) 06/15/2013   MSPIKE Not Observed 01/25/2017   SPEI * 06/15/2013     Chemistry      Component Value Date/Time   NA 139 07/20/2018 0901   NA 142 06/28/2017 0855   K 4.8 07/20/2018 0901   K 4.5 06/28/2017 0855   CL 101 07/20/2018 0901   CL 99 06/28/2017 0855   CO2 33 (H) 07/20/2018 0901   CO2 32 06/28/2017 0855   BUN 15 07/20/2018 0901   BUN 15 06/28/2017 0855   CREATININE 0.98 07/20/2018 0901   CREATININE 1.0 06/28/2017 0855      Component Value Date/Time   CALCIUM 9.1 07/20/2018 0901   CALCIUM 9.2 06/28/2017 0855   ALKPHOS 68 07/20/2018 0901   ALKPHOS 74 06/28/2017 0855   AST 19 07/20/2018 0901   ALT 20 07/20/2018 0901   ALT 13 06/28/2017 0855   BILITOT 0.9 07/20/2018 0901     Impression and Plan: Lucas [name] is very pleasant Lucas [name] with CLL. We have not had to treat him now for over 20 years.   We will plan to get him back in 6 months.  I think this would be reasonable.  He was knows that if he has a problem he can always come and see [place].   Volanda Napoleon, MD 12/4/201910:29 AM

## 2018-08-11 ENCOUNTER — Other Ambulatory Visit: Payer: Self-pay | Admitting: Cardiovascular Disease

## 2018-08-29 ENCOUNTER — Encounter (HOSPITAL_COMMUNITY): Payer: Self-pay

## 2018-08-29 ENCOUNTER — Telehealth: Payer: Self-pay | Admitting: Cardiovascular Disease

## 2018-08-29 ENCOUNTER — Other Ambulatory Visit: Payer: Self-pay

## 2018-08-29 ENCOUNTER — Emergency Department (HOSPITAL_COMMUNITY)
Admission: EM | Admit: 2018-08-29 | Discharge: 2018-08-29 | Disposition: A | Discharge status: Home / Self Care | Payer: Medicare Other | Attending: Emergency Medicine | Admitting: Emergency Medicine

## 2018-08-29 DIAGNOSIS — Z7982 Long term (current) use of aspirin: Secondary | ICD-10-CM | POA: Diagnosis not present

## 2018-08-29 DIAGNOSIS — R42 Dizziness and giddiness: Secondary | ICD-10-CM | POA: Diagnosis not present

## 2018-08-29 DIAGNOSIS — Z79899 Other long term (current) drug therapy: Secondary | ICD-10-CM | POA: Insufficient documentation

## 2018-08-29 DIAGNOSIS — Z856 Personal history of leukemia: Secondary | ICD-10-CM | POA: Diagnosis not present

## 2018-08-29 DIAGNOSIS — Z7984 Long term (current) use of oral hypoglycemic drugs: Secondary | ICD-10-CM | POA: Insufficient documentation

## 2018-08-29 DIAGNOSIS — E119 Type 2 diabetes mellitus without complications: Secondary | ICD-10-CM | POA: Diagnosis not present

## 2018-08-29 DIAGNOSIS — Z91040 Latex allergy status: Secondary | ICD-10-CM | POA: Insufficient documentation

## 2018-08-29 DIAGNOSIS — I480 Paroxysmal atrial fibrillation: Secondary | ICD-10-CM | POA: Insufficient documentation

## 2018-08-29 DIAGNOSIS — Z87891 Personal history of nicotine dependence: Secondary | ICD-10-CM | POA: Insufficient documentation

## 2018-08-29 LAB — URINALYSIS, ROUTINE W REFLEX MICROSCOPIC
Bilirubin Urine: NEGATIVE
Glucose, UA: NEGATIVE mg/dL
Hgb urine dipstick: NEGATIVE
Ketones, ur: NEGATIVE mg/dL
Leukocytes, UA: NEGATIVE
NITRITE: NEGATIVE
Protein, ur: NEGATIVE mg/dL
Specific Gravity, Urine: 1.008 (ref 1.005–1.030)
pH: 6 (ref 5.0–8.0)

## 2018-08-29 LAB — CBC
HCT: 47.9 % (ref 39.0–52.0)
Hemoglobin: 14.9 g/dL (ref 13.0–17.0)
MCH: 30.9 pg (ref 26.0–34.0)
MCHC: 31.1 g/dL (ref 30.0–36.0)
MCV: 99.4 fL (ref 80.0–100.0)
Platelets: 96 10*3/uL — ABNORMAL LOW (ref 150–400)
RBC: 4.82 MIL/uL (ref 4.22–5.81)
RDW: 13.9 % (ref 11.5–15.5)
WBC: 21.5 10*3/uL — ABNORMAL HIGH (ref 4.0–10.5)
nRBC: 0.4 % — ABNORMAL HIGH (ref 0.0–0.2)

## 2018-08-29 LAB — BASIC METABOLIC PANEL
Anion gap: 8 (ref 5–15)
BUN: 16 mg/dL (ref 8–23)
CALCIUM: 8.4 mg/dL — AB (ref 8.9–10.3)
CO2: 26 mmol/L (ref 22–32)
Chloride: 101 mmol/L (ref 98–111)
Creatinine, Ser: 1.17 mg/dL (ref 0.61–1.24)
GFR calc Af Amer: >60 mL/min (ref >60)
GFR, EST NON AFRICAN AMERICAN: 60 mL/min — AB (ref >60)
Glucose, Bld: 198 mg/dL — ABNORMAL HIGH (ref 70–99)
Potassium: 5.1 mmol/L (ref 3.5–5.1)
SODIUM: 135 mmol/L (ref 135–145)

## 2018-08-29 MED ORDER — APIXABAN 5 MG PO TABS
5.0000 mg | ORAL_TABLET | Freq: Once | ORAL | Status: AC
  Administered 2018-08-29: 5 mg via ORAL
  Filled 2018-08-29: qty 1

## 2018-08-29 MED ORDER — DILTIAZEM HCL 25 MG/5ML IV SOLN
20.0000 mg | Freq: Once | INTRAVENOUS | Status: AC
  Administered 2018-08-29: 20 mg via INTRAVENOUS
  Filled 2018-08-29: qty 5

## 2018-08-29 MED ORDER — APIXABAN 5 MG PO TABS: 5.0000 mg | ORAL_TABLET | Freq: Two times a day (BID) | ORAL | 0 refills | Status: DC

## 2018-08-29 NOTE — Telephone Encounter (Signed)
paov Lucas [name]  

## 2018-08-29 NOTE — Telephone Encounter (Signed)
Spoke to pt with PAF Hx who states that he can feel that his HR is irregular. Pt also states that he has been lightheaded with irregular HR since around 11pm on Saturday, but that he is not as lightheaded as he was on Saturday although he feels the need to hold on to the railing in his home while declining down steps. Pt c/o numbness to the back of both arms as well. Pt states that his recent BP and HR reading this AM are 110/74 and 74. Per Gwenlyn Found, MD note on 06/07/2018, pt has "History of paroxysmal atrial fibrillation currently not on oral anticoagulant agent because of a CHAD2VASaSc score of 1.  He had 2 episodes in the last year, one on 04/09/2018 while in [place] and one on 05/09/2018." Will route to DOD-C[place], MD.

## 2018-08-29 NOTE — ED Notes (Signed)
RX X 1 GIVEN

## 2018-08-29 NOTE — Progress Notes (Signed)
Pharmacy Pt and wife educated about Eliquis and given 30 day free card Eudelia Bunch, Pharm.D 08/29/2018 5:06 PM

## 2018-08-29 NOTE — Discharge Instructions (Addendum)
Double your dose of Metoprolol until seen by cardiology

## 2018-08-29 NOTE — Telephone Encounter (Signed)
Pt contacted with DOD recommendation to schedule an office visit. Pt scheduled for OV with Jory Sims, NP on 09/05/2018 at 4pm; pt agreeable with this. Advised pt to report to ED if symptoms persist and/or increase in severity and/or if HR rises to 120 or above. Pt verbalized understanding

## 2018-08-29 NOTE — ED Provider Notes (Signed)
[facility] DEPT Provider Note   CSN: [id number] Arrival date & time: 08/29/18  1245     History   Chief Complaint Chief Complaint  Patient presents with  . Atrial Fibrillation    HPI Lucas [name] is a 78 y.o. male.  HPI Patient is a 78 year old male with a history of paroxysmal atrial fibrillation who presents the emergency department with complaints of A. fib since Saturday night.  He knows he is in A. fib when he feels slightly lightheaded and checks his pulse and finds his pulse to be irregular.  He states normally his paroxysmal A. fib events last for 3 to 5 hours.  He has taken an additional dose of his metoprolol and despite this he is remained in persistent A. fib for the past several days.  He called his cardiology office today who is able to see him on Thursday but at the recommendation of his primary care physician came to the ER for further evaluation.  Presents without significant symptoms.  No chest pain or shortness of breath.  No lightheadedness at this time.  He is on aspirin only.  His last chads 2 Vasc score was 1. Today his score is 3  CHA2DS2/VAS Stroke Risk Points  Current as of 34 minutes ago     3 >= 2 Points: High Risk  1 - 1.99 Points: Medium Risk  0 Points: Low Risk    This is the only CHA2DS2/VAS Stroke Risk Points available for the past  year.:  Last Change: N/A     Details    This score determines the patient's risk of having a stroke if the  patient has atrial fibrillation.       Points Metrics  0 Has Congestive Heart Failure:  No    Current as of 34 minutes ago  0 Has Vascular Disease:  No    Current as of 34 minutes ago  0 Has Hypertension:  No    Current as of 34 minutes ago  2 Age:  21    Current as of 34 minutes ago  1 Has Diabetes:  Yes    Current as of 34 minutes ago  0 Had Stroke:  No  Had TIA:  No  Had thromboembolism:  No    Current as of 34 minutes ago  0 Male:  No    Current as of 34  minutes ago             Past Medical History:  Diagnosis Date  . Atrial fibrillation (HCC)    paroxysmal; Normal Coronary Arteries on Cath Jan 2013  . CLL (chronic lymphoblastic leukemia) 09/03/2011   dx 1999  . Diabetes mellitus,borderline 09/10/2011    Patient Active Problem List   Diagnosis Date Noted  . Syncope and collapse 04/14/2017  . Paroxysmal atrial fibrillation ([place]) 09/10/2011    Class: Acute  . Diabetes mellitus,borderline 09/10/2011  . Chronic lymphoblastic leukemia 09/03/2011    Past Surgical History:  Procedure Laterality Date  . CARDIOVASCULAR STRESS TEST  09/15/2011   Mild-moderate perfusion defect due to infarct/scar w/ moderate perinfarct ischemia seen in basal inferoseptal, basal inferior, mid inferoseptal, mid inferior and apical inferior regions. Observed defect may in part be attributable to diagphragmatic attenuation, however there is also reversibility that would suggest a true defect. EKG negative for ischemia.  Marland Kitchen CATARACT EXTRACTION  2012  . CYST REMOVAL TRUNK  2010   back  . LEFT HEART CATHETERIZATION WITH CORONARY ANGIOGRAM N/A 09/17/2011  Procedure: LEFT HEART CATHETERIZATION WITH CORONARY ANGIOGRAM;  Surgeon: [name], MD;  Location: [facility] CATH LAB: Normal Coronary Arteries. EF ~60%. No RWMA  . ROTATOR CUFF REPAIR  1999  . TONSILLECTOMY    . TRANSTHORACIC ECHOCARDIOGRAM  09/15/2011; Dec 2014   EF >55%, Normal LV systolic function; EF 62-83%. GR 1 DD. No RWMA.  Borderline left atrial dilation. Otherwise normal        Home Medications    Prior to Admission medications   Medication Sig Start Date End Date Taking? Authorizing Provider  aspirin 325 MG tablet Take 325 mg by mouth daily as needed (AFIB).   Yes [provider]  aspirin 81 MG tablet Take by mouth. 03/05/10  Yes [provider]  brimonidine (ALPHAGAN) 0.2 % ophthalmic solution Place 1 drop into the left eye 2 (two) times daily.  08/05/11  Yes [provider]  chlorpheniramine (CHLOR-TRIMETON) 4 MG tablet Take 4 mg by mouth daily as needed for allergies.   Yes [provider]  metFORMIN (GLUCOPHAGE) 500 MG tablet Take 500 mg by mouth daily.  04/28/17  Yes [provider]  metoprolol succinate (TOPROL-XL) 25 MG 24 hr tablet Take 0.5 tablets (12.5 mg total) by mouth daily. ** DO NOT CRUSH **    (BETA BLOCKER) 08/11/18  Yes [name], MD  sildenafil (VIAGRA) 50 MG tablet Take 1 tablet (50 mg total) by mouth daily as needed for erectile dysfunction. 09/03/14  Yes Brett Canales, PA-C  timolol (TIMOPTIC) 0.5 % ophthalmic solution Place 1 drop into the left eye daily. Left eye 06/25/11  Yes [provider]    Family History Family History  Problem Relation Age of Onset  . Cancer Mother        Multiple myeloma  . Colon cancer Mother   . Heart attack Father        Died age 40  . Stroke Father   . Cancer Sister        Breast cancer  . Heart attack Paternal Grandmother   . Cancer Paternal Grandmother        Breast cancer  . Stroke Paternal Grandfather     Social History Social History   Tobacco Use  . Smoking status: Former Smoker    Packs/day: 1.00    Years: 3.00    Pack years: 3.00    Types: Cigarettes    Start date: 09/19/1962    Last attempt to quit: 12/17/1965    Years since quitting: 52.7  . Smokeless tobacco: Never Used  . Tobacco comment: quit 72 yeras ago  Substance Use Topics  . Alcohol use: Yes    Alcohol/week: 4.0 standard drinks    Types: 4 Glasses of wine per week  . Drug use: No     Allergies   Other; Shellfish-derived products; Adhesive [tape]; Shellfish allergy; and Latex   Review of Systems Review of Systems  All other systems reviewed and are negative.    Physical Exam Updated Vital Signs BP 94/60   Pulse 60   Resp 18   Ht 6' (1.829 m)   Wt 74.8 kg   SpO2 97%   BMI 22.38 kg/m   Physical Exam Vitals signs and nursing note reviewed.  Constitutional:       Appearance: He is well-developed.  HENT:     Head: Normocephalic and atraumatic.  Neck:     Musculoskeletal: Normal range of motion.  Cardiovascular:     Rate and Rhythm: Normal rate.  Rhythm irregular.     Heart sounds: Normal heart sounds.  Pulmonary:     Effort: Pulmonary effort is normal. No respiratory distress.     Breath sounds: Normal breath sounds.  Abdominal:     General: There is no distension.     Palpations: Abdomen is soft.     Tenderness: There is no abdominal tenderness.  Musculoskeletal: Normal range of motion.  Skin:    General: Skin is warm and dry.  Neurological:     Mental Status: He is alert and oriented to person, place, and time.  Psychiatric:        Judgment: Judgment normal.      ED Treatments / Results  Labs (all labs ordered are listed, but only abnormal results are displayed) Labs Reviewed  BASIC METABOLIC PANEL - Abnormal; Notable for the following components:      Result Value   Glucose, Bld 198 (*)    Calcium 8.4 (*)    GFR calc non Af Amer 60 (*)    All other components within normal limits  CBC - Abnormal; Notable for the following components:   WBC 21.5 (*)    Platelets 96 (*)    nRBC 0.4 (*)    All other components within normal limits  URINALYSIS, ROUTINE W REFLEX MICROSCOPIC    EKG EKG Interpretation  Date/Time:  Monday August 29 2018 12:55:27 EST Ventricular Rate:  91 PR Interval:    QRS Duration: 88 QT Interval:  377 QTC Calculation: 464 R Axis:   18 Text Interpretation:  Atrial fibrillation afib new since prior tracing Confirmed by Jola Schmidt [phone]) on 08/29/2018 4:06:25 PM   Radiology No results found.  Procedures Procedures (including critical care time)  Medications Ordered in ED Medications  apixaban (ELIQUIS) tablet 5 mg (has no administration in time range)  diltiazem (CARDIZEM) injection 20 mg (20 mg Intravenous Given 08/29/18 1444)     Initial Impression / Assessment and Plan / ED Course  I  have reviewed the triage vital signs and the nursing notes.  Pertinent labs & imaging results that were available during my care of the patient were reviewed by me and considered in my medical decision making (see chart for details).    Rate controlled atrial fibrillation.  Will attempt control with IV Cardizem at this time.  Patient is on chronic anticoagulation and therefore he is not a candidate for cardioversion.  Patient score today is 3 and therefore he may be a candidate for long-term oral anticoagulant use.  This will be decided upon by his cardiology team.  Eating and drinking normally lately.  No significant increase in his caffeine intake or his wine intake.  Continues to travel frequently   4:15 PM bp 110/72. Will dc home with anticoagulation and cardiology follow up in 4 days. Oral anticoagulant risks given to patient and spouse.  We will double his metoprolol until seen by cardiology on Thursday all questions answered   Message sent to his cardiologist Dr. Gwenlyn Found regarding his care in the ER today  Final Clinical Impressions(s) / ED Diagnoses   Final diagnoses:  Paroxysmal atrial fibrillation [facility])    ED Discharge Orders    None       Jola Schmidt, MD 08/29/18 [phone]

## 2018-08-29 NOTE — ED Triage Notes (Addendum)
Patient reports that he has a history of atrial fib and c/o having it x 3 days. Patient states when he gets dizzy he is usually having a a. Fib. Patient also reports that his episodes of atrial fib usually just last hours and not days.

## 2018-08-29 NOTE — ED Notes (Signed)
ED Provider at bedside. 

## 2018-08-29 NOTE — Telephone Encounter (Signed)
Patient c/o Palpitations:  High priority if patient c/o lightheadedness, shortness of breath, or chest pain  1) How long have you had palpitations/irregular HR/ Afib? Are you having the symptoms now? Over 48 hours and yes  2) Are you currently experiencing lightheadedness, SOB or CP? Lightheadedness a lot on yesterday and not so much. 3) Do you have a history of afib (atrial fibrillation) or irregular heart rhythm? Yes   4) Have you checked your BP or HR? (document readings if available) No  5) Are you experiencing any other symptoms? No

## 2018-08-31 DIAGNOSIS — L821 Other seborrheic keratosis: Secondary | ICD-10-CM | POA: Diagnosis not present

## 2018-08-31 DIAGNOSIS — L570 Actinic keratosis: Secondary | ICD-10-CM | POA: Diagnosis not present

## 2018-08-31 DIAGNOSIS — Z23 Encounter for immunization: Secondary | ICD-10-CM | POA: Diagnosis not present

## 2018-09-01 ENCOUNTER — Encounter: Payer: Self-pay | Admitting: Physician Assistant

## 2018-09-01 ENCOUNTER — Ambulatory Visit (INDEPENDENT_AMBULATORY_CARE_PROVIDER_SITE_OTHER): Payer: Medicare Other | Admitting: Physician Assistant

## 2018-09-01 VITALS — BP 112/68 | HR 61 | Ht 72.0 in | Wt 170.6 lb

## 2018-09-01 DIAGNOSIS — I480 Paroxysmal atrial fibrillation: Secondary | ICD-10-CM | POA: Diagnosis not present

## 2018-09-01 DIAGNOSIS — E119 Type 2 diabetes mellitus without complications: Secondary | ICD-10-CM

## 2018-09-01 DIAGNOSIS — C9110 Chronic lymphocytic leukemia of B-cell type not having achieved remission: Secondary | ICD-10-CM

## 2018-09-01 MED ORDER — METOPROLOL SUCCINATE ER 25 MG PO TB24: 25.0000 mg | ORAL_TABLET | Freq: Every day | ORAL | 1 refills | Status: DC

## 2018-09-01 NOTE — Patient Instructions (Signed)
Medication Instructions:  Your physician recommends that you continue on your current medications as directed. Please refer to the Current Medication list given to you today.  If you need a refill on your cardiac medications before your next appointment, please call your pharmacy.   Lab work: Your physician recommends that you return for lab work in: 1 week (Cbc)  If you have labs (blood work) drawn today and your tests are completely normal, you will receive your results only by: Marland Kitchen MyChart Message (if you have MyChart) OR . A paper copy in the mail If you have any lab test that is abnormal or we need to change your treatment, we will call you to review the results.  Testing/Procedures: None ordered  Follow-Up: At [facility], you and your health needs are our priority.  As part of our continuing mission to provide you with exceptional heart care, we have created designated Provider Care Teams.  These Care Teams include your primary Cardiologist (physician) and Advanced Practice Providers (APPs -  Physician Assistants and Nurse Practitioners) who all work together to provide you with the care you need, when you need it.  Your physician recommends that you schedule a follow-up appointment in: 3-4 months with Dr.Berry

## 2018-09-01 NOTE — Progress Notes (Signed)
Cardiology Office Note    Date:  09/03/2018   ID:  Lucas [name], DOB 1941/03/19, MRN [id number]  PCP:  Burnard Bunting, MD  Cardiologist:  Dr. Gwenlyn Found   Chief Complaint  Patient presents with  . Follow-up    seen for Dr. Gwenlyn Found.    History of Present Illness:  Lucas [name] is a 78 y.o. male with past medical history of remote CLL in remission, DM 2, and atrial fibrillation.  Patient had a cardiac catheterization in January 2018 after abnormal Myoview, this revealed normal coronaries suggesting false positive stress test.  He has been followed by Dr. Marin Olp for his CLL.  Patient was most recently seen by Dr. Gwenlyn Found on 06/07/2018 at which time he was doing well.  He most recently presented to the ED on 08/29/2017 for atrial fibrillation which was rate controlled.  He was not cardioverted since the onset of atrial fibrillation was 48 hours prior to arrival and he was not on systemic anticoagulation therapy prior to ED arrival.  Given CHA2DS2-Vasc score of 3, he was started on Eliquis.  Since discharge, he has been doing well.  Based on today's EKG, he has converted back to sinus rhythm.  He is compliant with Eliquis without significant hematuria or GI bleeding.  I will obtain a CBC.  Otherwise, he does not have significant cardiac awareness of atrial fibrillation, he only noticed atrial fibrillation when checked his pulse and when he is feeling dizzy.  Otherwise, his heart rate is very well controlled.  I will continue him on the current medication.   Past Medical History:  Diagnosis Date  . Atrial fibrillation (HCC)    paroxysmal; Normal Coronary Arteries on Cath Jan 2013  . CLL (chronic lymphoblastic leukemia) 09/03/2011   dx 1999  . Diabetes mellitus,borderline 09/10/2011    Past Surgical History:  Procedure Laterality Date  . CARDIOVASCULAR STRESS TEST  09/15/2011   Mild-moderate perfusion defect due to infarct/scar w/ moderate perinfarct ischemia seen in basal inferoseptal,  basal inferior, mid inferoseptal, mid inferior and apical inferior regions. Observed defect may in part be attributable to diagphragmatic attenuation, however there is also reversibility that would suggest a true defect. EKG negative for ischemia.  Marland Kitchen CATARACT EXTRACTION  2012  . CYST REMOVAL TRUNK  2010   back  . LEFT HEART CATHETERIZATION WITH CORONARY ANGIOGRAM N/A 09/17/2011   Procedure: LEFT HEART CATHETERIZATION WITH CORONARY ANGIOGRAM;  Surgeon: [name], MD;  Location: [facility] CATH LAB: Normal Coronary Arteries. EF ~60%. No RWMA  . ROTATOR CUFF REPAIR  1999  . TONSILLECTOMY    . TRANSTHORACIC ECHOCARDIOGRAM  09/15/2011; Dec 2014   EF >55%, Normal LV systolic function; EF 25-42%. GR 1 DD. No RWMA.  Borderline left atrial dilation. Otherwise normal    Current Medications: Outpatient Medications Prior to Visit  Medication Sig Dispense Refill  . apixaban (ELIQUIS) 5 MG TABS tablet Take 1 tablet (5 mg total) by mouth 2 (two) times daily for 30 days. 60 tablet 0  . brimonidine (ALPHAGAN) 0.2 % ophthalmic solution Place 1 drop into the left eye 2 (two) times daily.     . chlorpheniramine (CHLOR-TRIMETON) 4 MG tablet Take 4 mg by mouth daily as needed for allergies.    . metFORMIN (GLUCOPHAGE) 500 MG tablet Take 500 mg by mouth daily.     . sildenafil (VIAGRA) 50 MG tablet Take 1 tablet (50 mg total) by mouth daily as needed for erectile dysfunction. 10 tablet 0  . timolol (TIMOPTIC)  0.5 % ophthalmic solution Place 1 drop into the left eye daily. Left eye    . metoprolol succinate (TOPROL-XL) 25 MG 24 hr tablet Take 0.5 tablets (12.5 mg total) by mouth daily. ** DO NOT CRUSH **    (BETA BLOCKER) (Patient taking differently: Take 25 mg by mouth daily. ** DO NOT CRUSH **    (BETA BLOCKER)) 45 tablet 2  . metoprolol succinate (TOPROL-XL) 25 MG 24 hr tablet Take 25 mg by mouth daily.    Marland Kitchen aspirin 325 MG tablet Take 325 mg by mouth daily as needed (AFIB).    Marland Kitchen aspirin 81 MG tablet Take by mouth.      No facility-administered medications prior to visit.      Allergies:   Other; Shellfish-derived products; Adhesive [tape]; Shellfish allergy; and Latex   Social History   Socioeconomic History  . Marital status: Married    Spouse name: Not on file  . Number of children: 1  . Years of education: Not on file  . Highest education level: Not on file  Occupational History    Employer: RETIRED  Social Needs  . Financial resource strain: Not on file  . Food insecurity:    Worry: Not on file    Inability: Not on file  . Transportation needs:    Medical: Not on file    Non-medical: Not on file  Tobacco Use  . Smoking status: Former Smoker    Packs/day: 1.00    Years: 3.00    Pack years: 3.00    Types: Cigarettes    Start date: 09/19/1962    Last attempt to quit: 12/17/1965    Years since quitting: 52.7  . Smokeless tobacco: Never Used  . Tobacco comment: quit 47 yeras ago  Substance and Sexual Activity  . Alcohol use: Yes    Alcohol/week: 4.0 standard drinks    Types: 4 Glasses of wine per week  . Drug use: No  . Sexual activity: Not on file  Lifestyle  . Physical activity:    Days per week: Not on file    Minutes per session: Not on file  . Stress: Not on file  Relationships  . Social connections:    Talks on phone: Not on file    Gets together: Not on file    Attends religious service: Not on file    Active member of club or organization: Not on file    Attends meetings of clubs or organizations: Not on file    Relationship status: Not on file  Other Topics Concern  . Not on file  Social History Narrative   Married Caucasian male, father to 62 stepchild, grandfather to 2 step-grandchildren.   Lives with his wife.   Travels a lot & enjoys photography.     Family History:  The patient's family history includes Cancer in his mother, paternal grandmother, and sister; Colon cancer in his mother; Heart attack in his father and paternal grandmother; Stroke in his father  and paternal grandfather.   ROS:   Please see the history of present illness.    ROS All other systems reviewed and are negative.   PHYSICAL EXAM:   VS:  BP 112/68   Pulse 61   Ht 6' (1.829 m)   Wt 170 lb 9.6 oz (77.4 kg)   BMI 23.14 kg/m    GEN: Well nourished, well developed, in no acute distress  HEENT: normal  Neck: no JVD, carotid bruits, or masses Cardiac: RRR; no murmurs,  rubs, or gallops,no edema  Respiratory:  clear to auscultation bilaterally, normal work of breathing GI: soft, nontender, nondistended, + BS MS: no deformity or atrophy  Skin: warm and dry, no rash Neuro:  Alert and Oriented x 3, Strength and sensation are intact Psych: euthymic mood, full affect  Wt Readings from Last 3 Encounters:  09/01/18 170 lb 9.6 oz (77.4 kg)  08/29/18 165 lb (74.8 kg)  07/20/18 167 lb 8 oz (76 kg)      Studies/Labs Reviewed:   EKG:  EKG is ordered today.  The ekg ordered today demonstrates NSR without significant ST-T wave changes  Recent Labs: 07/20/2018: ALT 20 08/29/2018: BUN 16; Creatinine, Ser 1.17; Hemoglobin 14.9; Platelets 96; Potassium 5.1; Sodium 135   Lipid Panel    Component Value Date/Time   CHOL 168 09/10/2011 0420   TRIG 84 09/10/2011 0420   HDL 44 09/10/2011 0420   CHOLHDL 3.8 09/10/2011 0420   VLDL 17 09/10/2011 0420   LDLCALC 107 (H) 09/10/2011 0420    Additional studies/ records that were reviewed today include:   Echo 07/18/2013 LV EF: 60% -  65%  ------------------------------------------------------------ Indications:   427.31 Atrial Fibrillation.  ------------------------------------------------------------ Study Conclusions  - Left ventricle: The cavity size was normal. Wall thickness was normal. Systolic function was normal. The estimated ejection fraction was in the range of 60% to 65%. Wall motion was normal; there were no regional wall motion abnormalities. Doppler parameters are consistent with abnormal left  ventricular relaxation (grade 1 diastolic dysfunction). The E/e' ratio is <10, suggesting normal LV filling pressure. - Mitral valve: Mildly thickened leaflets . Mild regurgitation. - Left atrium: LA volume/ BSA = 27.0 ml/m2 The atrium was at the upper limits of normal in size. - Tricuspid valve: Trivial regurgitation. - Inferior vena cava: The vessel was normal in size; the respirophasic diameter changes were in the normal range (= 50%); findings are consistent with normal central venous pressure. - Pericardium, extracardiac: There was no pericardial effusion.   ASSESSMENT:    1. PAF (paroxysmal atrial fibrillation) ([place])   2. CLL (chronic lymphocytic leukemia) ([place])   3. Controlled type 2 diabetes mellitus without complication, without long-term current use of insulin (HCC)      PLAN:  In order of problems listed above:  1. Paroxysmal atrial fibrillation: Started on Eliquis given CHADS-VAsc score of 3 (age, DM).  Obtain repeat CBC in 1 week to make sure there is no sign of anemia.  Otherwise he is very well rate controlled  2. CLL: in remission.  Recent lab work shows white blood cell count of 25 consistent with history of CLL  3. DM2: Managed by primary care provider    Medication Adjustments/Labs and Tests Ordered: Current medicines are reviewed at length with the patient today.  Concerns regarding medicines are outlined above.  Medication changes, Labs and Tests ordered today are listed in the Patient Instructions below. Patient Instructions  Medication Instructions:  Your physician recommends that you continue on your current medications as directed. Please refer to the Current Medication list given to you today.  If you need a refill on your cardiac medications before your next appointment, please call your pharmacy.   Lab work: Your physician recommends that you return for lab work in: 1 week (Cbc)  If you have labs (blood work) drawn today and  your tests are completely normal, you will receive your results only by: Marland Kitchen MyChart Message (if you have MyChart) OR . A paper copy in the  mail If you have any lab test that is abnormal or we need to change your treatment, we will call you to review the results.  Testing/Procedures: None ordered  Follow-Up: At [facility], you and your health needs are our priority.  As part of our continuing mission to provide you with exceptional heart care, we have created designated Provider Care Teams.  These Care Teams include your primary Cardiologist (physician) and Advanced Practice Providers (APPs -  Physician Assistants and Nurse Practitioners) who all work together to provide you with the care you need, when you need it.  Your physician recommends that you schedule a follow-up appointment in: 3-4 months with Dr.Berry      Signed, Almyra Deforest, [place]  09/03/2018 11:41 PM    [place] [address], [place], [place]  [address] Phone: [phone]; Fax: [phone]

## 2018-09-03 ENCOUNTER — Encounter: Payer: Self-pay | Admitting: Physician Assistant

## 2018-09-05 ENCOUNTER — Ambulatory Visit: Payer: Medicare Other | Admitting: Adult Health

## 2018-09-08 LAB — CBC WITH DIFFERENTIAL/PLATELET

## 2018-09-09 ENCOUNTER — Other Ambulatory Visit: Payer: Self-pay

## 2018-09-09 DIAGNOSIS — I480 Paroxysmal atrial fibrillation: Secondary | ICD-10-CM

## 2018-09-09 NOTE — Progress Notes (Signed)
Called patient and informed him that his recent labs were unable to be processed and asked if he could come in next week to have them redrawn. Patient stated that he will be in on Monday morning 09/12/2018.

## 2018-09-09 NOTE — Progress Notes (Signed)
cbc

## 2018-09-09 NOTE — Progress Notes (Signed)
For some reason, lab was unable to run the blood drawn. Was it because not enough blood to run the lab?

## 2018-09-12 DIAGNOSIS — I480 Paroxysmal atrial fibrillation: Secondary | ICD-10-CM | POA: Diagnosis not present

## 2018-09-13 ENCOUNTER — Telehealth: Payer: Self-pay | Admitting: Cardiovascular Disease

## 2018-09-13 LAB — CBC WITH DIFFERENTIAL
BASOS ABS: 0.1 10*3/uL (ref 0.0–0.2)
Basos: 0 %
EOS (ABSOLUTE): 0 10*3/uL (ref 0.0–0.4)
Eos: 0 %
HEMOGLOBIN: 14.7 g/dL (ref 13.0–17.7)
Hematocrit: 44.7 % (ref 37.5–51.0)
Immature Grans (Abs): 0 10*3/uL (ref 0.0–0.1)
Immature Granulocytes: 0 %
LYMPHS: 86 %
Lymphocytes Absolute: 18.6 10*3/uL — ABNORMAL HIGH (ref 0.7–3.1)
MCH: 30.9 pg (ref 26.6–33.0)
MCHC: 32.9 g/dL (ref 31.5–35.7)
MCV: 94 fL (ref 79–97)
MONOCYTES: 2 %
Monocytes Absolute: 0.5 10*3/uL (ref 0.1–0.9)
Neutrophils Absolute: 2.6 10*3/uL (ref 1.4–7.0)
Neutrophils: 12 %
RBC: 4.76 x10E6/uL (ref 4.14–5.80)
RDW: 12.7 % (ref 11.6–15.4)
WBC: 21.8 10*3/uL (ref 3.4–10.8)

## 2018-09-13 MED ORDER — APIXABAN 5 MG PO TABS: 5.0000 mg | ORAL_TABLET | Freq: Two times a day (BID) | ORAL | 3 refills | Status: DC

## 2018-09-13 NOTE — Progress Notes (Signed)
Called and left a voice message for the patient to return call on home and cell phones.

## 2018-09-13 NOTE — Progress Notes (Signed)
White blood cell count is high as expected given his history of CLL, I drew this lab mainly to look at his hemoglobin which is stable.

## 2018-09-13 NOTE — Progress Notes (Signed)
Patient was informed of results by Fredia Beets, RN upon returning telephone message.

## 2018-09-13 NOTE — Telephone Encounter (Signed)
  Patient returning call regarding lab results, patient leaving for the airport at 1:00 pm

## 2018-09-13 NOTE — Telephone Encounter (Signed)
Spoke with pt, aware of lab results. 

## 2018-09-20 MED ORDER — APIXABAN 5 MG PO TABS: 5.0000 mg | ORAL_TABLET | Freq: Two times a day (BID) | ORAL | 2 refills | Status: DC

## 2018-10-03 DIAGNOSIS — E1139 Type 2 diabetes mellitus with other diabetic ophthalmic complication: Secondary | ICD-10-CM | POA: Diagnosis not present

## 2018-10-03 DIAGNOSIS — H9190 Unspecified hearing loss, unspecified ear: Secondary | ICD-10-CM | POA: Diagnosis not present

## 2018-10-03 DIAGNOSIS — J302 Other seasonal allergic rhinitis: Secondary | ICD-10-CM | POA: Diagnosis not present

## 2018-10-03 DIAGNOSIS — H3093 Unspecified chorioretinal inflammation, bilateral: Secondary | ICD-10-CM | POA: Diagnosis not present

## 2018-10-03 DIAGNOSIS — G629 Polyneuropathy, unspecified: Secondary | ICD-10-CM | POA: Diagnosis not present

## 2018-10-03 DIAGNOSIS — I4891 Unspecified atrial fibrillation: Secondary | ICD-10-CM | POA: Diagnosis not present

## 2018-10-03 DIAGNOSIS — E1149 Type 2 diabetes mellitus with other diabetic neurological complication: Secondary | ICD-10-CM | POA: Diagnosis not present

## 2018-10-03 DIAGNOSIS — Z1331 Encounter for screening for depression: Secondary | ICD-10-CM | POA: Diagnosis not present

## 2018-10-03 DIAGNOSIS — N401 Enlarged prostate with lower urinary tract symptoms: Secondary | ICD-10-CM | POA: Diagnosis not present

## 2018-10-03 DIAGNOSIS — M25562 Pain in left knee: Secondary | ICD-10-CM | POA: Diagnosis not present

## 2018-10-03 DIAGNOSIS — C9100 Acute lymphoblastic leukemia not having achieved remission: Secondary | ICD-10-CM | POA: Diagnosis not present

## 2018-10-03 DIAGNOSIS — E7849 Other hyperlipidemia: Secondary | ICD-10-CM | POA: Diagnosis not present

## 2018-10-15 ENCOUNTER — Encounter: Payer: Self-pay | Admitting: Gastroenterology

## 2018-10-17 DIAGNOSIS — M25561 Pain in right knee: Secondary | ICD-10-CM | POA: Diagnosis not present

## 2018-10-24 DIAGNOSIS — M25561 Pain in right knee: Secondary | ICD-10-CM | POA: Diagnosis not present

## 2018-10-29 DIAGNOSIS — M25561 Pain in right knee: Secondary | ICD-10-CM | POA: Diagnosis not present

## 2018-10-31 DIAGNOSIS — B301 Conjunctivitis due to adenovirus: Secondary | ICD-10-CM | POA: Diagnosis not present

## 2018-11-02 DIAGNOSIS — J302 Other seasonal allergic rhinitis: Secondary | ICD-10-CM | POA: Diagnosis not present

## 2018-11-02 DIAGNOSIS — J029 Acute pharyngitis, unspecified: Secondary | ICD-10-CM | POA: Diagnosis not present

## 2018-11-02 DIAGNOSIS — J209 Acute bronchitis, unspecified: Secondary | ICD-10-CM | POA: Diagnosis not present

## 2018-11-29 ENCOUNTER — Telehealth: Payer: Self-pay | Admitting: *Deleted

## 2018-11-29 NOTE — Telephone Encounter (Signed)
   Cardiac Questionnaire:    Since your last visit or hospitalization:    1. Have you been having new or worsening chest pain? NO   2. Have you been having new or worsening shortness of breath? NO 3. Have you been having new or worsening leg swelling, wt gain, or increase in abdominal girth (pants fitting more tightly)? NO   4. Have you had any passing out spells? NO    *A YES to any of these questions would result in the appointment being kept. *If all the answers to these questions are NO, we should indicate that given the current situation regarding the worldwide coronarvirus pandemic, at the recommendation of the CDC, we are looking to limit gatherings in our waiting area, and thus will reschedule their appointment beyond four weeks from today.   _____________   COVID-19 Pre-Screening Questions:  . Do you currently have a fever? NO . Have you recently travelled on a cruise, internationally, or to [place], [place], [place], [place], [place], or [place], [place] ([employer])? NO . Have you been in contact with someone that is currently pending confirmation of Covid19 testing or has been confirmed to have the Covid19 virus?  NO Are you currently experiencing fatigue or cough?NO          

## 2018-12-02 ENCOUNTER — Ambulatory Visit: Payer: Medicare Other | Admitting: Cardiovascular Disease

## 2018-12-02 DIAGNOSIS — E1149 Type 2 diabetes mellitus with other diabetic neurological complication: Secondary | ICD-10-CM | POA: Diagnosis not present

## 2018-12-02 DIAGNOSIS — Z7901 Long term (current) use of anticoagulants: Secondary | ICD-10-CM | POA: Diagnosis not present

## 2018-12-02 DIAGNOSIS — L237 Allergic contact dermatitis due to plants, except food: Secondary | ICD-10-CM | POA: Diagnosis not present

## 2018-12-02 DIAGNOSIS — I4891 Unspecified atrial fibrillation: Secondary | ICD-10-CM | POA: Diagnosis not present

## 2018-12-02 DIAGNOSIS — L298 Other pruritus: Secondary | ICD-10-CM | POA: Diagnosis not present

## 2018-12-12 DIAGNOSIS — M25561 Pain in right knee: Secondary | ICD-10-CM | POA: Diagnosis not present

## 2018-12-12 DIAGNOSIS — S83241D Other tear of medial meniscus, current injury, right knee, subsequent encounter: Secondary | ICD-10-CM | POA: Diagnosis not present

## 2018-12-27 DIAGNOSIS — C44329 Squamous cell carcinoma of skin of other parts of face: Secondary | ICD-10-CM | POA: Diagnosis not present

## 2018-12-27 DIAGNOSIS — L910 Hypertrophic scar: Secondary | ICD-10-CM | POA: Diagnosis not present

## 2018-12-27 DIAGNOSIS — L821 Other seborrheic keratosis: Secondary | ICD-10-CM | POA: Diagnosis not present

## 2018-12-27 DIAGNOSIS — L570 Actinic keratosis: Secondary | ICD-10-CM | POA: Diagnosis not present

## 2018-12-27 DIAGNOSIS — Z85828 Personal history of other malignant neoplasm of skin: Secondary | ICD-10-CM | POA: Diagnosis not present

## 2018-12-27 DIAGNOSIS — D485 Neoplasm of uncertain behavior of skin: Secondary | ICD-10-CM | POA: Diagnosis not present

## 2018-12-27 DIAGNOSIS — D2261 Melanocytic nevi of right upper limb, including shoulder: Secondary | ICD-10-CM | POA: Diagnosis not present

## 2018-12-27 DIAGNOSIS — Z808 Family history of malignant neoplasm of other organs or systems: Secondary | ICD-10-CM | POA: Diagnosis not present

## 2019-01-04 ENCOUNTER — Telehealth: Payer: Self-pay | Admitting: Cardiovascular Disease

## 2019-01-04 NOTE — Telephone Encounter (Signed)
Patient with diagnosis of Afib on Eliquis for anticoagulation.    Procedure: Right Knee Arthroscopy  Date of procedure: 02/13/2019  CHADS2-VASc score of  3 (CHF, HTN, AGE, DM2, stroke/tia x 2, CAD, AGE, male)  CrCl 79ml/min  Per office protocol, patient can hold Eliquis for 3 days prior to procedure IF USING SPINAL ANESTHESIA, if not then patient may hold 2 days prior to procedure.

## 2019-01-04 NOTE — Telephone Encounter (Signed)
    [facility] Pre-operative Risk Assessment    Request for surgical clearance:  1. What type of surgery is being performed? Right Knee Arthroscopy   2. When is this surgery scheduled?  02/13/19   3. What type of clearance is required (medical clearance vs. Pharmacy clearance to hold med vs. Both)?  Both  4. Are there any medications that need to be held prior to surgery and how long? Eliquis   5. Practice name and name of physician performing surgery? Dr. Paralee Cancel   6. What is your office phone number: [phone]     1.   What is your office fax number: [phone]  8.   Anesthesia type (None, local, MAC, general) ? Anesthesia Block with MAC   Lucas [name] 01/04/2019, 11:00 AM  _________________________________________________________________   (provider comments below)

## 2019-01-04 NOTE — Telephone Encounter (Signed)
Please comment on eliquis. 

## 2019-01-04 NOTE — Telephone Encounter (Signed)
   Primary Cardiologist: [name], MD  Chart reviewed as part of pre-operative protocol coverage. Patient was contacted 01/04/2019 in reference to pre-operative risk assessment for pending surgery as outlined below.  Lucas [name] was last seen on 09/01/18 by Almyra Deforest, PA.  Since that day, Lucas [name] has done well from a cardiac standpoint.  Therefore, based on ACC/AHA guidelines, the patient would be at acceptable risk for the planned procedure without further cardiovascular testing.          Patient with diagnosis of Afib on Eliquis for anticoagulation.    Procedure: Right Knee Arthroscopy Date of procedure: 02/13/2019  CHADS2-VASc score of  3 (CHF, HTN, AGE, DM2, stroke/tia x 2, CAD, AGE, male)  CrCl 52ml/min  Per office protocol, patient can hold Eliquis for 3 days prior to procedure IF USING SPINAL ANESTHESIA, if not then patient may hold 2 days prior to procedure.           I will route this recommendation to the requesting party via Epic fax function and remove from pre-op pool.  Please call with questions.  Daune Perch, NP 01/04/2019, 1:18 PM

## 2019-01-10 DIAGNOSIS — C44329 Squamous cell carcinoma of skin of other parts of face: Secondary | ICD-10-CM | POA: Diagnosis not present

## 2019-01-19 ENCOUNTER — Inpatient Hospital Stay: Payer: Medicare Other | Attending: Hematology & Oncology | Admitting: Hematology & Oncology

## 2019-01-19 ENCOUNTER — Other Ambulatory Visit: Payer: Self-pay

## 2019-01-19 ENCOUNTER — Encounter: Payer: Self-pay | Admitting: Hematology & Oncology

## 2019-01-19 ENCOUNTER — Inpatient Hospital Stay: Payer: Medicare Other

## 2019-01-19 VITALS — BP 109/63 | HR 66 | Temp 98.1°F | Resp 18 | Wt 165.0 lb

## 2019-01-19 DIAGNOSIS — Z7901 Long term (current) use of anticoagulants: Secondary | ICD-10-CM | POA: Diagnosis not present

## 2019-01-19 DIAGNOSIS — C9110 Chronic lymphocytic leukemia of B-cell type not having achieved remission: Secondary | ICD-10-CM | POA: Insufficient documentation

## 2019-01-19 DIAGNOSIS — I480 Paroxysmal atrial fibrillation: Secondary | ICD-10-CM | POA: Diagnosis not present

## 2019-01-19 DIAGNOSIS — E119 Type 2 diabetes mellitus without complications: Secondary | ICD-10-CM | POA: Diagnosis not present

## 2019-01-19 LAB — CBC WITH DIFFERENTIAL (CANCER CENTER ONLY)
Abs Immature Granulocytes: 0.03 10*3/uL (ref 0.00–0.07)
Basophils Absolute: 0 10*3/uL (ref 0.0–0.1)
Basophils Relative: 0 %
Eosinophils Absolute: 0 10*3/uL (ref 0.0–0.5)
Eosinophils Relative: 0 %
HCT: 44.6 % (ref 39.0–52.0)
Hemoglobin: 14 g/dL (ref 13.0–17.0)
Immature Granulocytes: 0 %
Lymphocytes Relative: 75 %
Lymphs Abs: 11.2 10*3/uL — ABNORMAL HIGH (ref 0.7–4.0)
MCH: 31.1 pg (ref 26.0–34.0)
MCHC: 31.4 g/dL (ref 30.0–36.0)
MCV: 99.1 fL (ref 80.0–100.0)
Monocytes Absolute: 0.8 10*3/uL (ref 0.1–1.0)
Monocytes Relative: 6 %
Neutro Abs: 2.8 10*3/uL (ref 1.7–7.7)
Neutrophils Relative %: 19 %
Platelet Count: 85 10*3/uL — ABNORMAL LOW (ref 150–400)
RBC: 4.5 MIL/uL (ref 4.22–5.81)
RDW: 14.2 % (ref 11.5–15.5)
WBC Count: 14.8 10*3/uL — ABNORMAL HIGH (ref 4.0–10.5)
nRBC: 0 % (ref 0.0–0.2)

## 2019-01-19 LAB — CMP (CANCER CENTER ONLY)
ALT: 9 U/L (ref 0–44)
AST: 11 U/L — ABNORMAL LOW (ref 15–41)
Albumin: 4.2 g/dL (ref 3.5–5.0)
Alkaline Phosphatase: 61 U/L (ref 38–126)
Anion gap: 6 (ref 5–15)
BUN: 16 mg/dL (ref 8–23)
CO2: 32 mmol/L (ref 22–32)
Calcium: 8.6 mg/dL — ABNORMAL LOW (ref 8.9–10.3)
Chloride: 99 mmol/L (ref 98–111)
Creatinine: 0.99 mg/dL (ref 0.61–1.24)
GFR, Est AFR Am: >60 mL/min (ref >60)
GFR, Estimated: >60 mL/min (ref >60)
Glucose, Bld: 309 mg/dL — ABNORMAL HIGH (ref 70–99)
Potassium: 4.8 mmol/L (ref 3.5–5.1)
Sodium: 137 mmol/L (ref 135–145)
Total Bilirubin: 0.8 mg/dL (ref 0.3–1.2)
Total Protein: 6.5 g/dL (ref 6.5–8.1)

## 2019-01-19 LAB — LACTATE DEHYDROGENASE: LDH: 112 U/L (ref 98–192)

## 2019-01-19 LAB — SAVE SMEAR(SSMR), FOR PROVIDER SLIDE REVIEW

## 2019-01-19 NOTE — Progress Notes (Signed)
Hematology and Oncology Follow Up Visit  Lucas [name] [id number] 06/25/1941 78 y.o. 01/19/2019   Principle Diagnosis:  1. Chronic lymphocytic leukemia - stage C  Current Therapy:   Observation    Interim History:  Lucas [name] is here today for a follow-up.  The coronavirus really has affected his travel plans.  He was supposed to go to [place] in the spring but did not make it.  I think it was actually a blessing that he tore a meniscus in his right knee.  He was playing pickle ball.  He did not have surgery on June 29.  Otherwise, he is doing okay.  Has had no problems with adenopathy.  He does have diabetes now.  He is on metformin.  His blood sugar this morning was 309.  He is also on Eliquis.  He has paroxysmal atrial fibrillation.  He has had no problems with bowels or bladder.  Overall, his performance status is ECOG 1.  Der habits.  He has had no rashes.    Overall, his performance status is ECOG 0.    Medications:  Allergies as of 01/19/2019      Reactions   Other Anaphylaxis   Shellfish   Shellfish-derived Products Anaphylaxis   Adhesive [tape] Other (See Comments)   REDNESS   Nsaids    Shellfish Allergy Swelling   Latex Rash      Medication List       Accurate as of January 19, 2019  9:33 AM. If you have any questions, ask your nurse or doctor.        brimonidine 0.2 % ophthalmic solution Commonly known as:  ALPHAGAN Place 1 drop into the left eye 2 (two) times daily.   chlorpheniramine 4 MG tablet Commonly known as:  CHLOR-TRIMETON Take 4 mg by mouth daily as needed for allergies.   Eliquis 5 MG Tabs tablet Generic drug:  apixaban Take 5 mg by mouth 2 (two) times a day. What changed:  Another medication with the same name was removed. Continue taking this medication, and follow the directions you see here. Changed by:  Volanda Napoleon, MD   metFORMIN 500 MG tablet Commonly known as:  GLUCOPHAGE Take 500 mg by mouth daily.   metoprolol  succinate 25 MG 24 hr tablet Commonly known as:  TOPROL-XL Take 1 tablet (25 mg total) by mouth daily.   sildenafil 50 MG tablet Commonly known as:  Viagra Take 1 tablet (50 mg total) by mouth daily as needed for erectile dysfunction.   timolol 0.5 % ophthalmic solution Commonly known as:  TIMOPTIC Place 1 drop into the left eye daily. Left eye       Allergies:  Allergies  Allergen Reactions  . Other Anaphylaxis    Shellfish  . Shellfish-Derived Products Anaphylaxis  . Adhesive [Tape] Other (See Comments)    REDNESS  . Nsaids   . Shellfish Allergy Swelling  . Latex Rash    Past Medical History, Surgical history, Social history, and Family History were reviewed and updated.  Review of Systems: Review of Systems  Constitutional: Negative.   HENT: Negative.   Eyes: Negative.   Respiratory: Negative.   Cardiovascular: Negative.   Gastrointestinal: Negative.   Genitourinary: Negative.   Musculoskeletal: Negative.   Skin: Negative.   Neurological: Negative.   Endo/Heme/Allergies: Negative.   Psychiatric/Behavioral: Negative.      Physical Exam:  weight is 165 lb (74.8 kg). His oral temperature is 98.1 F (36.7 C). His blood pressure  is 109/63 and his pulse is 66. His respiration is 18 and oxygen saturation is 98%.   Wt Readings from Last 3 Encounters:  01/19/19 165 lb (74.8 kg)  09/01/18 170 lb 9.6 oz (77.4 kg)  08/29/18 165 lb (74.8 kg)    Physical Exam Vitals signs reviewed.  HENT:     Head: Normocephalic and atraumatic.  Eyes:     Pupils: Pupils are equal, round, and reactive to light.  Neck:     Musculoskeletal: Normal range of motion.  Cardiovascular:     Rate and Rhythm: Normal rate and regular rhythm.     Heart sounds: Normal heart sounds.  Pulmonary:     Effort: Pulmonary effort is normal.     Breath sounds: Normal breath sounds.  Abdominal:     General: Bowel sounds are normal.     Palpations: Abdomen is soft.  Musculoskeletal: Normal range  of motion.        General: No tenderness or deformity.  Lymphadenopathy:     Cervical: No cervical adenopathy.  Skin:    General: Skin is warm and dry.     Findings: No erythema or rash.  Neurological:     Mental Status: He is alert and oriented to person, place, and time.  Psychiatric:        Behavior: Behavior normal.        Thought Content: Thought content normal.        Judgment: Judgment normal.      Lab Results  Component Value Date   WBC 14.8 (H) 01/19/2019   HGB 14.0 01/19/2019   HCT 44.6 01/19/2019   MCV 99.1 01/19/2019   PLT 85 (L) 01/19/2019   No results found for: FERRITIN, IRON, TIBC, UIBC, IRONPCTSAT Lab Results  Component Value Date   RETICCTPCT 1.0 06/15/2013   RBC 4.50 01/19/2019   RETICCTABS 47.6 06/15/2013   Lab Results  Component Value Date   KAPLAMBRATIO 1.83 (H) 10/31/2015   Lab Results  Component Value Date   IGGSERUM 895 01/25/2017   IGA 92 06/15/2013   IGMSERUM 113 01/25/2017   Lab Results  Component Value Date   TOTALPROTELP 6.5 06/15/2013   ALBUMINELP 61.7 06/15/2013   A1GS 3.5 06/15/2013   A2GS 9.8 06/15/2013   BETS 5.3 06/15/2013   BETA2SER 8.7 (H) 06/15/2013   GAMS 11.0 (L) 06/15/2013   MSPIKE Not Observed 01/25/2017   SPEI * 06/15/2013     Chemistry      Component Value Date/Time   NA 137 01/19/2019 0857   NA 142 06/28/2017 0855   K 4.8 01/19/2019 0857   K 4.5 06/28/2017 0855   CL 99 01/19/2019 0857   CL 99 06/28/2017 0855   CO2 32 01/19/2019 0857   CO2 32 06/28/2017 0855   BUN 16 01/19/2019 0857   BUN 15 06/28/2017 0855   CREATININE 0.99 01/19/2019 0857   CREATININE 1.0 06/28/2017 0855      Component Value Date/Time   CALCIUM 8.6 (L) 01/19/2019 0857   CALCIUM 9.2 06/28/2017 0855   ALKPHOS 61 01/19/2019 0857   ALKPHOS 74 06/28/2017 0855   AST 11 (L) 01/19/2019 0857   ALT 9 01/19/2019 0857   ALT 13 06/28/2017 0855   BILITOT 0.8 01/19/2019 0857     Impression and Plan: Lucas [name] is very pleasant  78 year old-year-old male with CLL. We have not had to treat him now for over 20 years.   We will plan to get him back in 6 months.  I  think this would be reasonable.  I do not see problems with him having surgery for his knee.  Hopefully his diabetes will be under better control I think this would be the biggest problem for recovery.  He was knows that if he has a problem he can always come and see [place].   Volanda Napoleon, MD 6/4/20209:33 AM

## 2019-02-13 DIAGNOSIS — M94261 Chondromalacia, right knee: Secondary | ICD-10-CM | POA: Diagnosis not present

## 2019-02-13 DIAGNOSIS — M23241 Derangement of anterior horn of lateral meniscus due to old tear or injury, right knee: Secondary | ICD-10-CM | POA: Diagnosis not present

## 2019-02-13 DIAGNOSIS — M23221 Derangement of posterior horn of medial meniscus due to old tear or injury, right knee: Secondary | ICD-10-CM | POA: Diagnosis not present

## 2019-02-20 DIAGNOSIS — E785 Hyperlipidemia, unspecified: Secondary | ICD-10-CM | POA: Diagnosis not present

## 2019-02-20 DIAGNOSIS — E1149 Type 2 diabetes mellitus with other diabetic neurological complication: Secondary | ICD-10-CM | POA: Diagnosis not present

## 2019-02-20 DIAGNOSIS — N401 Enlarged prostate with lower urinary tract symptoms: Secondary | ICD-10-CM | POA: Diagnosis not present

## 2019-02-20 DIAGNOSIS — I4891 Unspecified atrial fibrillation: Secondary | ICD-10-CM | POA: Diagnosis not present

## 2019-02-20 DIAGNOSIS — Z7901 Long term (current) use of anticoagulants: Secondary | ICD-10-CM | POA: Diagnosis not present

## 2019-02-20 DIAGNOSIS — J302 Other seasonal allergic rhinitis: Secondary | ICD-10-CM | POA: Diagnosis not present

## 2019-02-20 DIAGNOSIS — H3093 Unspecified chorioretinal inflammation, bilateral: Secondary | ICD-10-CM | POA: Diagnosis not present

## 2019-02-20 DIAGNOSIS — C9100 Acute lymphoblastic leukemia not having achieved remission: Secondary | ICD-10-CM | POA: Diagnosis not present

## 2019-02-20 DIAGNOSIS — M25562 Pain in left knee: Secondary | ICD-10-CM | POA: Diagnosis not present

## 2019-02-20 DIAGNOSIS — H9190 Unspecified hearing loss, unspecified ear: Secondary | ICD-10-CM | POA: Diagnosis not present

## 2019-02-20 DIAGNOSIS — E1139 Type 2 diabetes mellitus with other diabetic ophthalmic complication: Secondary | ICD-10-CM | POA: Diagnosis not present

## 2019-02-20 DIAGNOSIS — G629 Polyneuropathy, unspecified: Secondary | ICD-10-CM | POA: Diagnosis not present

## 2019-03-09 DIAGNOSIS — M25561 Pain in right knee: Secondary | ICD-10-CM | POA: Diagnosis not present

## 2019-03-15 DIAGNOSIS — M25561 Pain in right knee: Secondary | ICD-10-CM | POA: Diagnosis not present

## 2019-03-17 DIAGNOSIS — M25561 Pain in right knee: Secondary | ICD-10-CM | POA: Diagnosis not present

## 2019-03-20 DIAGNOSIS — M25561 Pain in right knee: Secondary | ICD-10-CM | POA: Diagnosis not present

## 2019-03-21 DIAGNOSIS — L820 Inflamed seborrheic keratosis: Secondary | ICD-10-CM | POA: Diagnosis not present

## 2019-03-21 DIAGNOSIS — D485 Neoplasm of uncertain behavior of skin: Secondary | ICD-10-CM | POA: Diagnosis not present

## 2019-03-21 DIAGNOSIS — D0461 Carcinoma in situ of skin of right upper limb, including shoulder: Secondary | ICD-10-CM | POA: Diagnosis not present

## 2019-03-21 DIAGNOSIS — L570 Actinic keratosis: Secondary | ICD-10-CM | POA: Diagnosis not present

## 2019-03-22 DIAGNOSIS — M25561 Pain in right knee: Secondary | ICD-10-CM | POA: Diagnosis not present

## 2019-03-27 DIAGNOSIS — M25561 Pain in right knee: Secondary | ICD-10-CM | POA: Diagnosis not present

## 2019-03-29 ENCOUNTER — Other Ambulatory Visit: Payer: Self-pay | Admitting: Physician Assistant

## 2019-03-29 DIAGNOSIS — D0461 Carcinoma in situ of skin of right upper limb, including shoulder: Secondary | ICD-10-CM | POA: Diagnosis not present

## 2019-03-29 DIAGNOSIS — M25561 Pain in right knee: Secondary | ICD-10-CM | POA: Diagnosis not present

## 2019-04-03 DIAGNOSIS — M25561 Pain in right knee: Secondary | ICD-10-CM | POA: Diagnosis not present

## 2019-05-04 DIAGNOSIS — H401122 Primary open-angle glaucoma, left eye, moderate stage: Secondary | ICD-10-CM | POA: Diagnosis not present

## 2019-05-04 DIAGNOSIS — Z23 Encounter for immunization: Secondary | ICD-10-CM | POA: Diagnosis not present

## 2019-05-24 DIAGNOSIS — E1139 Type 2 diabetes mellitus with other diabetic ophthalmic complication: Secondary | ICD-10-CM | POA: Diagnosis not present

## 2019-05-24 DIAGNOSIS — E7849 Other hyperlipidemia: Secondary | ICD-10-CM | POA: Diagnosis not present

## 2019-05-24 DIAGNOSIS — Z125 Encounter for screening for malignant neoplasm of prostate: Secondary | ICD-10-CM | POA: Diagnosis not present

## 2019-05-25 DIAGNOSIS — E1139 Type 2 diabetes mellitus with other diabetic ophthalmic complication: Secondary | ICD-10-CM | POA: Diagnosis not present

## 2019-05-25 DIAGNOSIS — R82998 Other abnormal findings in urine: Secondary | ICD-10-CM | POA: Diagnosis not present

## 2019-05-31 DIAGNOSIS — H3093 Unspecified chorioretinal inflammation, bilateral: Secondary | ICD-10-CM | POA: Diagnosis not present

## 2019-05-31 DIAGNOSIS — H409 Unspecified glaucoma: Secondary | ICD-10-CM | POA: Diagnosis not present

## 2019-05-31 DIAGNOSIS — E785 Hyperlipidemia, unspecified: Secondary | ICD-10-CM | POA: Diagnosis not present

## 2019-05-31 DIAGNOSIS — M25562 Pain in left knee: Secondary | ICD-10-CM | POA: Diagnosis not present

## 2019-05-31 DIAGNOSIS — Z0000 Encounter for general adult medical examination without abnormal findings: Secondary | ICD-10-CM | POA: Diagnosis not present

## 2019-05-31 DIAGNOSIS — Z7901 Long term (current) use of anticoagulants: Secondary | ICD-10-CM | POA: Diagnosis not present

## 2019-05-31 DIAGNOSIS — C9100 Acute lymphoblastic leukemia not having achieved remission: Secondary | ICD-10-CM | POA: Diagnosis not present

## 2019-05-31 DIAGNOSIS — E1139 Type 2 diabetes mellitus with other diabetic ophthalmic complication: Secondary | ICD-10-CM | POA: Diagnosis not present

## 2019-05-31 DIAGNOSIS — N401 Enlarged prostate with lower urinary tract symptoms: Secondary | ICD-10-CM | POA: Diagnosis not present

## 2019-05-31 DIAGNOSIS — G629 Polyneuropathy, unspecified: Secondary | ICD-10-CM | POA: Diagnosis not present

## 2019-05-31 DIAGNOSIS — E1149 Type 2 diabetes mellitus with other diabetic neurological complication: Secondary | ICD-10-CM | POA: Diagnosis not present

## 2019-05-31 DIAGNOSIS — I4891 Unspecified atrial fibrillation: Secondary | ICD-10-CM | POA: Diagnosis not present

## 2019-06-02 DIAGNOSIS — Z1212 Encounter for screening for malignant neoplasm of rectum: Secondary | ICD-10-CM | POA: Diagnosis not present

## 2019-06-13 ENCOUNTER — Ambulatory Visit (INDEPENDENT_AMBULATORY_CARE_PROVIDER_SITE_OTHER): Payer: Medicare Other | Admitting: Cardiovascular Disease

## 2019-06-13 ENCOUNTER — Encounter: Payer: Self-pay | Admitting: Cardiovascular Disease

## 2019-06-13 ENCOUNTER — Other Ambulatory Visit: Payer: Self-pay

## 2019-06-13 VITALS — BP 120/74 | HR 63 | Ht 72.0 in | Wt 164.0 lb

## 2019-06-13 DIAGNOSIS — I480 Paroxysmal atrial fibrillation: Secondary | ICD-10-CM | POA: Diagnosis not present

## 2019-06-13 DIAGNOSIS — Z008 Encounter for other general examination: Secondary | ICD-10-CM

## 2019-06-13 NOTE — Assessment & Plan Note (Signed)
History of PAF maintaining sinus rhythm on Eliquis oral anticoagulation.  Has had 2 episodes over the last year which were brief and self-limited.

## 2019-06-13 NOTE — Patient Instructions (Signed)

## 2019-06-13 NOTE — Progress Notes (Signed)
06/13/2019 Lucas [name]   30-Sep-1940  [id number]  Primary Physician Lucas Bunting, MD Primary Cardiologist: Lucas Harp MD FACP, [place], [place], [place]  HPI:  Lucas [name] is a 78 y.o.  thin and fit-appearing, married Caucasian male, father to 62 stepchild, grandfather to 2 step-grandchildren who I last saw in the office  06/07/2018. He had diagnostic cath performed by me in January after a positive Myoview that revealed normal coronary arteries, normal LV function suggesting a false positive test. He has PAF with RVR in the past with atypical chest pain. He also has CLL which is quiescent followed by Dr. Burney [name]. He did have a 3-week photo shoot safari in [place] in the [place] after I cath'd him and has taken [employer]. He is otherwise active and asymptomatic. His most recent lab work performed in January revealed total cholesterol 168, LDL 107, HDL 44. He was admitted to [facility] ER on 06/27/13 by Dr. Percival [name] for A. Fib. Plans were to convert him with oral flecainide however he converted on his own. He was seen in the [facility] ER by Dr. Sallyanne [name]. Since I saw him in the office one year ago he has had one episode of PAF in March of 2015while he was in [place]. His travel plans in the upcoming future included[place], [place], [place] in [place] in May followed by [place] in September.he was recently in [place] and had an episode of PAF on July 23 well on a train and a dining car. His most recent episode was on 04/13/17 after being woken up from a nap with rapid heart rate. This took 5 hours to spontaneously convert.   His travels continued last year  [place] and [place] as well as [place] and [place] in August.  2019 .  Unfortunately, because of COVID-19 his traveling has been put on hold although he is still active.  He continues to play golf and pickleball.  He had arthroscopic surgery on his right knee in June for torn meniscus.  He said 2 episodes  of PAF which were brief and self-limited since I saw him over the last 12 months.  He denies chest pain or shortness of breath.   Current Meds  Medication Sig  . apixaban (ELIQUIS) 5 MG TABS tablet Take 5 mg by mouth 2 (two) times a day.  . brimonidine (ALPHAGAN) 0.2 % ophthalmic solution Place 1 drop into the left eye 2 (two) times daily.   . chlorpheniramine (CHLOR-TRIMETON) 4 MG tablet Take 4 mg by mouth daily as needed for allergies.  . metFORMIN (GLUCOPHAGE) 500 MG tablet Take 500 mg by mouth daily.   . metoprolol succinate (TOPROL-XL) 25 MG 24 hr tablet TAKE 1 TABLET DAILY  . sildenafil (VIAGRA) 50 MG tablet Take 1 tablet (50 mg total) by mouth daily as needed for erectile dysfunction.  . timolol (TIMOPTIC) 0.5 % ophthalmic solution Place 1 drop into the left eye daily. Left eye     Allergies  Allergen Reactions  . Other Anaphylaxis    Shellfish  . Shellfish-Derived Products Anaphylaxis  . Adhesive [Tape] Other (See Comments)    REDNESS  . Nsaids   . Shellfish Allergy Swelling  . Latex Rash    Social History   Socioeconomic History  . Marital status: Married    Spouse name: Not on file  . Number of children: 1  . Years of education: Not on file  . Highest education level: Not on file  Occupational History  Employer: RETIRED  Social Needs  . Financial resource strain: Not on file  . Food insecurity    Worry: Not on file    Inability: Not on file  . Transportation needs    Medical: Not on file    Non-medical: Not on file  Tobacco Use  . Smoking status: Former Smoker    Packs/day: 1.00    Years: 3.00    Pack years: 3.00    Types: Cigarettes    Start date: 09/19/1962    Quit date: 12/17/1965    Years since quitting: 53.5  . Smokeless tobacco: Never Used  . Tobacco comment: quit 47 yeras ago  Substance and Sexual Activity  . Alcohol use: Yes    Alcohol/week: 4.0 standard drinks    Types: 4 Glasses of wine per week  . Drug use: No  . Sexual activity: Not on  file  Lifestyle  . Physical activity    Days per week: Not on file    Minutes per session: Not on file  . Stress: Not on file  Relationships  . Social Herbalist on phone: Not on file    Gets together: Not on file    Attends religious service: Not on file    Active member of club or organization: Not on file    Attends meetings of clubs or organizations: Not on file    Relationship status: Not on file  . Intimate partner violence    Fear of current or ex partner: Not on file    Emotionally abused: Not on file    Physically abused: Not on file    Forced sexual activity: Not on file  Other Topics Concern  . Not on file  Social History Narrative   Married Caucasian male, father to 51 stepchild, grandfather to 2 step-grandchildren.   Lives with his wife.   Travels a lot & enjoys photography.     Review of Systems: General: negative for chills, fever, night sweats or weight changes.  Cardiovascular: negative for chest pain, dyspnea on exertion, edema, orthopnea, palpitations, paroxysmal nocturnal dyspnea or shortness of breath Dermatological: negative for rash Respiratory: negative for cough or wheezing Urologic: negative for hematuria Abdominal: negative for nausea, vomiting, diarrhea, bright red blood per rectum, melena, or hematemesis Neurologic: negative for visual changes, syncope, or dizziness All other systems reviewed and are otherwise negative except as noted above.    Blood pressure 120/74, pulse 63, height 6' (1.829 m), weight 164 lb (74.4 kg), SpO2 96 %.  General appearance: alert and no distress Neck: no adenopathy, no carotid bruit, no JVD, supple, symmetrical, trachea midline and thyroid not enlarged, symmetric, no tenderness/mass/nodules Lungs: clear to auscultation bilaterally Heart: regular rate and rhythm, S1, S2 normal, no murmur, click, rub or gallop Extremities: extremities normal, atraumatic, no cyanosis or edema Pulses: 2+ and symmetric Skin:  Skin color, texture, turgor normal. No rashes or lesions Neurologic: Alert and oriented X 3, normal strength and tone. Normal symmetric reflexes. Normal coordination and gait  EKG sinus rhythm at 63.  I personally reviewed this EKG.  ASSESSMENT AND PLAN:   Paroxysmal atrial fibrillation (HCC) History of PAF maintaining sinus rhythm on Eliquis oral anticoagulation.  Has had 2 episodes over the last year which were brief and self-limited.      Lucas Harp MD FACP,FACC,FAHA, [facility] 06/13/2019 9:14 AM

## 2019-06-21 DIAGNOSIS — L570 Actinic keratosis: Secondary | ICD-10-CM | POA: Diagnosis not present

## 2019-06-21 DIAGNOSIS — L821 Other seborrheic keratosis: Secondary | ICD-10-CM | POA: Diagnosis not present

## 2019-06-21 DIAGNOSIS — Z85828 Personal history of other malignant neoplasm of skin: Secondary | ICD-10-CM | POA: Diagnosis not present

## 2019-06-21 DIAGNOSIS — D485 Neoplasm of uncertain behavior of skin: Secondary | ICD-10-CM | POA: Diagnosis not present

## 2019-06-21 DIAGNOSIS — D2261 Melanocytic nevi of right upper limb, including shoulder: Secondary | ICD-10-CM | POA: Diagnosis not present

## 2019-06-21 DIAGNOSIS — Z23 Encounter for immunization: Secondary | ICD-10-CM | POA: Diagnosis not present

## 2019-06-21 DIAGNOSIS — Z808 Family history of malignant neoplasm of other organs or systems: Secondary | ICD-10-CM | POA: Diagnosis not present

## 2019-06-21 DIAGNOSIS — C44619 Basal cell carcinoma of skin of left upper limb, including shoulder: Secondary | ICD-10-CM | POA: Diagnosis not present

## 2019-07-07 DIAGNOSIS — H40043 Steroid responder, bilateral: Secondary | ICD-10-CM | POA: Diagnosis not present

## 2019-07-07 DIAGNOSIS — H4089 Other specified glaucoma: Secondary | ICD-10-CM | POA: Diagnosis not present

## 2019-07-20 ENCOUNTER — Other Ambulatory Visit: Payer: Self-pay | Admitting: Cardiovascular Disease

## 2019-07-21 ENCOUNTER — Encounter: Payer: Self-pay | Admitting: Hematology & Oncology

## 2019-07-21 ENCOUNTER — Inpatient Hospital Stay (HOSPITAL_BASED_OUTPATIENT_CLINIC_OR_DEPARTMENT_OTHER): Payer: Medicare Other | Admitting: Hematology & Oncology

## 2019-07-21 ENCOUNTER — Telehealth: Payer: Self-pay | Admitting: Hematology & Oncology

## 2019-07-21 ENCOUNTER — Other Ambulatory Visit: Payer: Self-pay

## 2019-07-21 ENCOUNTER — Inpatient Hospital Stay: Payer: Medicare Other | Attending: Hematology & Oncology

## 2019-07-21 VITALS — BP 117/66 | HR 69 | Temp 97.3°F | Resp 20 | Wt 162.1 lb

## 2019-07-21 DIAGNOSIS — I480 Paroxysmal atrial fibrillation: Secondary | ICD-10-CM

## 2019-07-21 DIAGNOSIS — C9110 Chronic lymphocytic leukemia of B-cell type not having achieved remission: Secondary | ICD-10-CM | POA: Insufficient documentation

## 2019-07-21 LAB — CMP (CANCER CENTER ONLY)
ALT: 8 U/L (ref 0–44)
AST: 11 U/L — ABNORMAL LOW (ref 15–41)
Albumin: 4.4 g/dL (ref 3.5–5.0)
Alkaline Phosphatase: 55 U/L (ref 38–126)
Anion gap: 1 — ABNORMAL LOW (ref 5–15)
BUN: 17 mg/dL (ref 8–23)
CO2: 33 mmol/L — ABNORMAL HIGH (ref 22–32)
Calcium: 9.1 mg/dL (ref 8.9–10.3)
Chloride: 103 mmol/L (ref 98–111)
Creatinine: 0.98 mg/dL (ref 0.61–1.24)
GFR, Est AFR Am: >60 mL/min (ref >60)
GFR, Estimated: >60 mL/min (ref >60)
Glucose, Bld: 155 mg/dL — ABNORMAL HIGH (ref 70–99)
Potassium: 4.7 mmol/L (ref 3.5–5.1)
Sodium: 137 mmol/L (ref 135–145)
Total Bilirubin: 0.8 mg/dL (ref 0.3–1.2)
Total Protein: 6.8 g/dL (ref 6.5–8.1)

## 2019-07-21 LAB — CBC WITH DIFFERENTIAL (CANCER CENTER ONLY)
Abs Immature Granulocytes: 0.03 10*3/uL (ref 0.00–0.07)
Basophils Absolute: 0 10*3/uL (ref 0.0–0.1)
Basophils Relative: 0 %
Eosinophils Absolute: 0 10*3/uL (ref 0.0–0.5)
Eosinophils Relative: 0 %
HCT: 43.3 % (ref 39.0–52.0)
Hemoglobin: 13.9 g/dL (ref 13.0–17.0)
Immature Granulocytes: 0 %
Lymphocytes Relative: 79 %
Lymphs Abs: 12.3 10*3/uL — ABNORMAL HIGH (ref 0.7–4.0)
MCH: 30.7 pg (ref 26.0–34.0)
MCHC: 32.1 g/dL (ref 30.0–36.0)
MCV: 95.6 fL (ref 80.0–100.0)
Monocytes Absolute: 0.5 10*3/uL (ref 0.1–1.0)
Monocytes Relative: 3 %
Neutro Abs: 2.8 10*3/uL (ref 1.7–7.7)
Neutrophils Relative %: 18 %
Platelet Count: 91 10*3/uL — ABNORMAL LOW (ref 150–400)
RBC: 4.53 MIL/uL (ref 4.22–5.81)
RDW: 13.5 % (ref 11.5–15.5)
WBC Count: 15.6 10*3/uL — ABNORMAL HIGH (ref 4.0–10.5)
nRBC: 0 % (ref 0.0–0.2)

## 2019-07-21 LAB — LACTATE DEHYDROGENASE: LDH: 122 U/L (ref 98–192)

## 2019-07-21 LAB — SAVE SMEAR(SSMR), FOR PROVIDER SLIDE REVIEW

## 2019-07-21 LAB — PLATELET BY CITRATE

## 2019-07-21 NOTE — Progress Notes (Signed)
Hematology and Oncology Follow Up Visit  Lucas [name] [id number] 1940/10/20 78 y.o. 07/21/2019   Principle Diagnosis:  1. Chronic lymphocytic leukemia - stage C  Current Therapy:   Observation    Interim History:  Lucas [name] is here today for a follow-up.  He is doing pretty well.  Of course, the coronavirus has affected his lifestyle.  He and his wife not been able to travel which they typically do during the summer.  His wife was exposed to the coronavirus but thankfully she has not had any symptoms.  She is under quarantine.  He is feeling pretty well.  He is not having any problems with his knee.  He did physical therapy for his knee.  He has had no fever.  He has had no cough.  There has been no nausea or vomiting.  There has been no change in bowel or bladder habits.    He has had no problems with swollen lymph nodes.  He is wondering about the vaccine.  I told him that it would be safe for him to take the vaccine.  I thought his immune system was going to be adequate to mount a response.  Overall, his performance status is ECOG 0.    Medications:  Allergies as of 07/21/2019      Reactions   Other Anaphylaxis   Shellfish   Shellfish-derived Products Anaphylaxis   Adhesive [tape] Other (See Comments)   REDNESS   Nsaids    Shellfish Allergy Swelling   Latex Rash      Medication List       Accurate as of July 21, 2019  9:28 AM. If you have any questions, ask your nurse or doctor.        brimonidine 0.2 % ophthalmic solution Commonly known as: ALPHAGAN Place 1 drop into the left eye 2 (two) times daily.   chlorpheniramine 4 MG tablet Commonly known as: CHLOR-TRIMETON Take 4 mg by mouth daily as needed for allergies.   Eliquis 5 MG Tabs tablet Generic drug: apixaban Take 5 mg by mouth 2 (two) times a day.   metFORMIN 500 MG tablet Commonly known as: GLUCOPHAGE Take 500 mg by mouth daily.   metoprolol succinate 25 MG 24 hr tablet Commonly known  as: TOPROL-XL TAKE 1 TABLET DAILY   sildenafil 50 MG tablet Commonly known as: Viagra Take 1 tablet (50 mg total) by mouth daily as needed for erectile dysfunction.   timolol 0.5 % ophthalmic solution Commonly known as: TIMOPTIC Place 1 drop into the left eye daily. Left eye       Allergies:  Allergies  Allergen Reactions  . Other Anaphylaxis    Shellfish  . Shellfish-Derived Products Anaphylaxis  . Adhesive [Tape] Other (See Comments)    REDNESS  . Nsaids   . Shellfish Allergy Swelling  . Latex Rash    Past Medical History, Surgical history, Social history, and Family History were reviewed and updated.  Review of Systems: Review of Systems  Constitutional: Negative.   HENT: Negative.   Eyes: Negative.   Respiratory: Negative.   Cardiovascular: Negative.   Gastrointestinal: Negative.   Genitourinary: Negative.   Musculoskeletal: Negative.   Skin: Negative.   Neurological: Negative.   Endo/Heme/Allergies: Negative.   Psychiatric/Behavioral: Negative.      Physical Exam:  weight is 162 lb 1.9 oz (73.5 kg). His oral temperature is 97.3 F (36.3 C) (abnormal). His blood pressure is 117/66 and his pulse is 69. His respiration is 20 and  oxygen saturation is 99%.   Wt Readings from Last 3 Encounters:  07/21/19 162 lb 1.9 oz (73.5 kg)  06/13/19 164 lb (74.4 kg)  01/19/19 165 lb (74.8 kg)    Physical Exam Vitals signs reviewed.  HENT:     Head: Normocephalic and atraumatic.  Eyes:     Pupils: Pupils are equal, round, and reactive to light.  Neck:     Musculoskeletal: Normal range of motion.  Cardiovascular:     Rate and Rhythm: Normal rate and regular rhythm.     Heart sounds: Normal heart sounds.  Pulmonary:     Effort: Pulmonary effort is normal.     Breath sounds: Normal breath sounds.  Abdominal:     General: Bowel sounds are normal.     Palpations: Abdomen is soft.  Musculoskeletal: Normal range of motion.        General: No tenderness or  deformity.  Lymphadenopathy:     Cervical: No cervical adenopathy.  Skin:    General: Skin is warm and dry.     Findings: No erythema or rash.  Neurological:     Mental Status: He is alert and oriented to person, place, and time.  Psychiatric:        Behavior: Behavior normal.        Thought Content: Thought content normal.        Judgment: Judgment normal.      Lab Results  Component Value Date   WBC 15.6 (H) 07/21/2019   HGB 13.9 07/21/2019   HCT 43.3 07/21/2019   MCV 95.6 07/21/2019   PLT 91 (L) 07/21/2019   No results found for: FERRITIN, IRON, TIBC, UIBC, IRONPCTSAT Lab Results  Component Value Date   RETICCTPCT 1.0 06/15/2013   RBC 4.53 07/21/2019   RETICCTABS 47.6 06/15/2013   Lab Results  Component Value Date   KAPLAMBRATIO 1.83 (H) 10/31/2015   Lab Results  Component Value Date   IGGSERUM 895 01/25/2017   IGA 92 06/15/2013   IGMSERUM 113 01/25/2017   Lab Results  Component Value Date   TOTALPROTELP 6.5 06/15/2013   ALBUMINELP 61.7 06/15/2013   A1GS 3.5 06/15/2013   A2GS 9.8 06/15/2013   BETS 5.3 06/15/2013   BETA2SER 8.7 (H) 06/15/2013   GAMS 11.0 (L) 06/15/2013   MSPIKE Not Observed 01/25/2017   SPEI * 06/15/2013     Chemistry      Component Value Date/Time   NA 137 01/19/2019 0857   NA 142 06/28/2017 0855   K 4.8 01/19/2019 0857   K 4.5 06/28/2017 0855   CL 99 01/19/2019 0857   CL 99 06/28/2017 0855   CO2 32 01/19/2019 0857   CO2 32 06/28/2017 0855   BUN 16 01/19/2019 0857   BUN 15 06/28/2017 0855   CREATININE 0.99 01/19/2019 0857   CREATININE 1.0 06/28/2017 0855      Component Value Date/Time   CALCIUM 8.6 (L) 01/19/2019 0857   CALCIUM 9.2 06/28/2017 0855   ALKPHOS 61 01/19/2019 0857   ALKPHOS 74 06/28/2017 0855   AST 11 (L) 01/19/2019 0857   ALT 9 01/19/2019 0857   ALT 13 06/28/2017 0855   BILITOT 0.8 01/19/2019 0857     Impression and Plan: Lucas [name] is very pleasant 78 year old-year-old male with CLL. We have not had to  treat him now for over 20 years.  Everything looks quite stable from my point of view.  We will plan to get him back in 6 months.  I think this would  be reasonable.  I do not see problems with him having surgery for his knee.  Hopefully his diabetes will be under better control I think this would be the biggest problem for recovery.  He was knows that if he has a problem he can always come and see [place].   Volanda Napoleon, MD 12/4/20209:28 AM

## 2019-07-21 NOTE — Telephone Encounter (Signed)
Appointments scheduled calendar printed per 12/4 los °

## 2019-08-04 ENCOUNTER — Ambulatory Visit: Payer: Medicare Other | Attending: Internal Medicine

## 2019-08-04 DIAGNOSIS — Z20822 Contact with and (suspected) exposure to covid-19: Secondary | ICD-10-CM

## 2019-08-05 LAB — NOVEL CORONAVIRUS, NAA: SARS-CoV-2, NAA: NOT DETECTED

## 2019-08-21 ENCOUNTER — Encounter: Payer: Self-pay | Admitting: *Deleted

## 2019-08-21 ENCOUNTER — Telehealth: Payer: Self-pay | Admitting: Cardiovascular Disease

## 2019-08-21 NOTE — Telephone Encounter (Signed)
I spoke with pt. He lives in [place] but reports [place] is requiring letter from provider prior to patient's getting covid vaccine if they are on blood thinners. Pt would like to get covid vaccine as soon as it is available in [employer].  He is asking if Dr Gwenlyn Found would write letter indicating it is OK for him to get vaccine while on Eliquis so he will be ready when vaccine is available for him to take. He would like letter sent to him through my chart.

## 2019-08-21 NOTE — Telephone Encounter (Signed)
Patient states that he is next in line for getting the covid-19 vaccine. He states that he will need a letter in order to get the shot because he is on Eliquis.

## 2019-08-21 NOTE — Telephone Encounter (Signed)
I replied to patient via his My Chart (same) question.  [facility] is not requiring (that we can determine) permission for those on anticoagulation to have permission from MD.  When he is able to get his vaccine there should not be any conflict

## 2019-08-28 DIAGNOSIS — Z1331 Encounter for screening for depression: Secondary | ICD-10-CM | POA: Diagnosis not present

## 2019-08-28 DIAGNOSIS — E1149 Type 2 diabetes mellitus with other diabetic neurological complication: Secondary | ICD-10-CM | POA: Diagnosis not present

## 2019-08-28 DIAGNOSIS — Z7901 Long term (current) use of anticoagulants: Secondary | ICD-10-CM | POA: Diagnosis not present

## 2019-08-28 DIAGNOSIS — I4891 Unspecified atrial fibrillation: Secondary | ICD-10-CM | POA: Diagnosis not present

## 2019-08-28 DIAGNOSIS — E785 Hyperlipidemia, unspecified: Secondary | ICD-10-CM | POA: Diagnosis not present

## 2019-08-28 DIAGNOSIS — E1139 Type 2 diabetes mellitus with other diabetic ophthalmic complication: Secondary | ICD-10-CM | POA: Diagnosis not present

## 2019-08-28 DIAGNOSIS — H409 Unspecified glaucoma: Secondary | ICD-10-CM | POA: Diagnosis not present

## 2019-08-28 DIAGNOSIS — C9100 Acute lymphoblastic leukemia not having achieved remission: Secondary | ICD-10-CM | POA: Diagnosis not present

## 2019-08-28 DIAGNOSIS — H3093 Unspecified chorioretinal inflammation, bilateral: Secondary | ICD-10-CM | POA: Diagnosis not present

## 2019-08-28 DIAGNOSIS — G629 Polyneuropathy, unspecified: Secondary | ICD-10-CM | POA: Diagnosis not present

## 2019-09-08 ENCOUNTER — Ambulatory Visit: Payer: Medicare Other | Attending: Internal Medicine

## 2019-09-08 DIAGNOSIS — Z23 Encounter for immunization: Secondary | ICD-10-CM | POA: Insufficient documentation

## 2019-09-08 NOTE — Progress Notes (Signed)
   Covid-19 Vaccination Clinic  Name:  ANTRAVIOUS DHINGRA    MRN: [id number] DOB: Apr 25, 1941  09/08/2019  Mr. Frerking was observed post Covid-19 immunization for 15 minutes without incidence. He was provided with Vaccine Information Sheet and instruction to access the V-Safe system.   Mr. Blonski was instructed to call 911 with any severe reactions post vaccine: Marland Kitchen Difficulty breathing  . Swelling of your face and throat  . A fast heartbeat  . A bad rash all over your body  . Dizziness and weakness    Immunizations Administered    Name Date Dose VIS Date Route   Pfizer COVID-19 Vaccine 09/08/2019  9:19 AM 0.3 mL 07/28/2019 Intramuscular   Manufacturer: [place]   Lot: [id number]   [place]: [id number]

## 2019-09-21 DIAGNOSIS — D0421 Carcinoma in situ of skin of right ear and external auricular canal: Secondary | ICD-10-CM | POA: Diagnosis not present

## 2019-09-21 DIAGNOSIS — L821 Other seborrheic keratosis: Secondary | ICD-10-CM | POA: Diagnosis not present

## 2019-09-21 DIAGNOSIS — L570 Actinic keratosis: Secondary | ICD-10-CM | POA: Diagnosis not present

## 2019-09-21 DIAGNOSIS — D485 Neoplasm of uncertain behavior of skin: Secondary | ICD-10-CM | POA: Diagnosis not present

## 2019-09-22 ENCOUNTER — Other Ambulatory Visit: Payer: Self-pay | Admitting: Physician Assistant

## 2019-09-29 ENCOUNTER — Ambulatory Visit: Payer: Medicare Other | Attending: Internal Medicine

## 2019-09-29 DIAGNOSIS — Z23 Encounter for immunization: Secondary | ICD-10-CM | POA: Insufficient documentation

## 2019-09-29 NOTE — Progress Notes (Signed)
   Covid-19 Vaccination Clinic  Name:  Lucas [name]    MRN: [id number] DOB: [birth year]  09/29/2019  [name] was observed post Covid-19 immunization for 15 minutes without incidence. He was provided with Vaccine Information Sheet and instruction to access the V-Safe system.   [name] was instructed to call 911 with any severe reactions post vaccine: Marland Kitchen Difficulty breathing  . Swelling of your face and throat  . A fast heartbeat  . A bad rash all over your body  . Dizziness and weakness    Immunizations Administered    Name Date Dose VIS Date Route   Pfizer COVID-19 Vaccine 09/29/2019 10:41 AM 0.3 mL 07/28/2019 Intramuscular   Manufacturer: [place]   Lot: [id number]   [place]: [id number]

## 2019-10-20 DIAGNOSIS — Z85828 Personal history of other malignant neoplasm of skin: Secondary | ICD-10-CM | POA: Diagnosis not present

## 2019-10-20 DIAGNOSIS — D0421 Carcinoma in situ of skin of right ear and external auricular canal: Secondary | ICD-10-CM | POA: Diagnosis not present

## 2019-12-19 DIAGNOSIS — D2261 Melanocytic nevi of right upper limb, including shoulder: Secondary | ICD-10-CM | POA: Diagnosis not present

## 2019-12-19 DIAGNOSIS — L570 Actinic keratosis: Secondary | ICD-10-CM | POA: Diagnosis not present

## 2019-12-19 DIAGNOSIS — L821 Other seborrheic keratosis: Secondary | ICD-10-CM | POA: Diagnosis not present

## 2019-12-19 DIAGNOSIS — D225 Melanocytic nevi of trunk: Secondary | ICD-10-CM | POA: Diagnosis not present

## 2019-12-19 DIAGNOSIS — L820 Inflamed seborrheic keratosis: Secondary | ICD-10-CM | POA: Diagnosis not present

## 2019-12-19 DIAGNOSIS — L578 Other skin changes due to chronic exposure to nonionizing radiation: Secondary | ICD-10-CM | POA: Diagnosis not present

## 2019-12-19 DIAGNOSIS — Z85828 Personal history of other malignant neoplasm of skin: Secondary | ICD-10-CM | POA: Diagnosis not present

## 2019-12-19 DIAGNOSIS — B353 Tinea pedis: Secondary | ICD-10-CM | POA: Diagnosis not present

## 2019-12-19 DIAGNOSIS — L910 Hypertrophic scar: Secondary | ICD-10-CM | POA: Diagnosis not present

## 2019-12-20 DIAGNOSIS — E1149 Type 2 diabetes mellitus with other diabetic neurological complication: Secondary | ICD-10-CM | POA: Diagnosis not present

## 2019-12-20 DIAGNOSIS — E1139 Type 2 diabetes mellitus with other diabetic ophthalmic complication: Secondary | ICD-10-CM | POA: Diagnosis not present

## 2019-12-20 DIAGNOSIS — H3093 Unspecified chorioretinal inflammation, bilateral: Secondary | ICD-10-CM | POA: Diagnosis not present

## 2019-12-20 DIAGNOSIS — G629 Polyneuropathy, unspecified: Secondary | ICD-10-CM | POA: Diagnosis not present

## 2020-01-19 ENCOUNTER — Other Ambulatory Visit: Payer: Self-pay

## 2020-01-19 ENCOUNTER — Telehealth: Payer: Self-pay | Admitting: Hematology & Oncology

## 2020-01-19 ENCOUNTER — Inpatient Hospital Stay (HOSPITAL_BASED_OUTPATIENT_CLINIC_OR_DEPARTMENT_OTHER): Payer: Medicare Other | Admitting: Hematology & Oncology

## 2020-01-19 ENCOUNTER — Inpatient Hospital Stay: Payer: Medicare Other | Attending: Hematology & Oncology

## 2020-01-19 VITALS — BP 123/81 | HR 65 | Temp 97.1°F | Resp 16 | Wt 164.5 lb

## 2020-01-19 DIAGNOSIS — C9110 Chronic lymphocytic leukemia of B-cell type not having achieved remission: Secondary | ICD-10-CM

## 2020-01-19 DIAGNOSIS — I4891 Unspecified atrial fibrillation: Secondary | ICD-10-CM | POA: Insufficient documentation

## 2020-01-19 DIAGNOSIS — I480 Paroxysmal atrial fibrillation: Secondary | ICD-10-CM

## 2020-01-19 LAB — CMP (CANCER CENTER ONLY)
ALT: 12 U/L (ref 0–44)
AST: 14 U/L — ABNORMAL LOW (ref 15–41)
Albumin: 4.6 g/dL (ref 3.5–5.0)
Alkaline Phosphatase: 60 U/L (ref 38–126)
Anion gap: 4 — ABNORMAL LOW (ref 5–15)
BUN: 16 mg/dL (ref 8–23)
CO2: 33 mmol/L — ABNORMAL HIGH (ref 22–32)
Calcium: 9.7 mg/dL (ref 8.9–10.3)
Chloride: 100 mmol/L (ref 98–111)
Creatinine: 1.02 mg/dL (ref 0.61–1.24)
GFR, Est AFR Am: >60 mL/min (ref >60)
GFR, Estimated: >60 mL/min (ref >60)
Glucose, Bld: 150 mg/dL — ABNORMAL HIGH (ref 70–99)
Potassium: 4.8 mmol/L (ref 3.5–5.1)
Sodium: 137 mmol/L (ref 135–145)
Total Bilirubin: 1.1 mg/dL (ref 0.3–1.2)
Total Protein: 7.3 g/dL (ref 6.5–8.1)

## 2020-01-19 LAB — CBC WITH DIFFERENTIAL (CANCER CENTER ONLY)
Abs Immature Granulocytes: 0.04 10*3/uL (ref 0.00–0.07)
Basophils Absolute: 0 10*3/uL (ref 0.0–0.1)
Basophils Relative: 0 %
Eosinophils Absolute: 0 10*3/uL (ref 0.0–0.5)
Eosinophils Relative: 0 %
HCT: 49.3 % (ref 39.0–52.0)
Hemoglobin: 15.6 g/dL (ref 13.0–17.0)
Immature Granulocytes: 0 %
Lymphocytes Relative: 83 %
Lymphs Abs: 13.8 10*3/uL — ABNORMAL HIGH (ref 0.7–4.0)
MCH: 30.8 pg (ref 26.0–34.0)
MCHC: 31.6 g/dL (ref 30.0–36.0)
MCV: 97.2 fL (ref 80.0–100.0)
Monocytes Absolute: 0.4 10*3/uL (ref 0.1–1.0)
Monocytes Relative: 2 %
Neutro Abs: 2.4 10*3/uL (ref 1.7–7.7)
Neutrophils Relative %: 15 %
Platelet Count: 81 10*3/uL — ABNORMAL LOW (ref 150–400)
RBC: 5.07 MIL/uL (ref 4.22–5.81)
RDW: 14.1 % (ref 11.5–15.5)
WBC Count: 16.6 10*3/uL — ABNORMAL HIGH (ref 4.0–10.5)
nRBC: 0.1 % (ref 0.0–0.2)

## 2020-01-19 LAB — SAVE SMEAR(SSMR), FOR PROVIDER SLIDE REVIEW

## 2020-01-19 NOTE — Progress Notes (Signed)
Hematology and Oncology Follow Up Visit  Lucas [name] [id number] August 25, 1940 79 y.o. 01/19/2020   Principle Diagnosis:  1. Chronic lymphocytic leukemia - stage C  Current Therapy:   Observation    Interim History:  Lucas [name] is here today for a follow-up.  He is doing quite well.  We last saw him back in December.  He has had no problems since we last saw him 6 months ago.  He and his wife are going to go up to [place] for a couple weeks.  I think there will be leaving soon.  His heart has been doing okay.  He said he had one episode of atrial fibrillation since we last saw him.  He says the heart rate does go down a little bit.  He felt a little bit lightheaded in the office today.  His heart rate was down to 54.  He has not noted any lymph nodes.  He has had no problems with nausea or vomiting.  He has had no cough or shortness of breath.  He has had the coronavirus vaccine.  Overall, his performance status is ECOG 0.    Medications:  Allergies as of 01/19/2020      Reactions   Other Anaphylaxis   Shellfish   Shellfish-derived Products Anaphylaxis   Adhesive [tape] Other (See Comments)   REDNESS   Nsaids    Shellfish Allergy Swelling   Latex Rash      Medication List       Accurate as of January 19, 2020 10:45 AM. If you have any questions, ask your nurse or doctor.        aspirin 325 MG tablet aspirin 325 mg tablet  Take 1 tablet every day by oral route for 30 days.   brimonidine 0.2 % ophthalmic solution Commonly known as: ALPHAGAN Place 1 drop into the left eye 2 (two) times daily.   chlorpheniramine 4 MG tablet Commonly known as: CHLOR-TRIMETON Take 4 mg by mouth daily as needed for allergies.   Eliquis 5 MG Tabs tablet Generic drug: apixaban TAKE 1 TABLET TWICE A DAY   metFORMIN 500 MG tablet Commonly known as: GLUCOPHAGE Take 500 mg by mouth daily.   metoprolol succinate 25 MG 24 hr tablet Commonly known as: TOPROL-XL TAKE 1 TABLET DAILY    metroNIDAZOLE 250 MG tablet Commonly known as: FLAGYL Take 250 mg by mouth 3 (three) times daily.   sildenafil 50 MG tablet Commonly known as: Viagra Take 1 tablet (50 mg total) by mouth daily as needed for erectile dysfunction.   timolol 0.5 % ophthalmic solution Commonly known as: TIMOPTIC Place 1 drop into the left eye daily. Left eye   Voltaren 1 % Gel Generic drug: diclofenac Sodium Voltaren 1 % topical gel  APPLY 2 GRAMS TO THE AFFECTED AREA(S) BY TOPICAL ROUTE 4 TIMES PER DAY       Allergies:  Allergies  Allergen Reactions  . Other Anaphylaxis    Shellfish  . Shellfish-Derived Products Anaphylaxis  . Adhesive [Tape] Other (See Comments)    REDNESS  . Nsaids   . Shellfish Allergy Swelling  . Latex Rash    Past Medical History, Surgical history, Social history, and Family History were reviewed and updated.  Review of Systems: Review of Systems  Constitutional: Negative.   HENT: Negative.   Eyes: Negative.   Respiratory: Negative.   Cardiovascular: Negative.   Gastrointestinal: Negative.   Genitourinary: Negative.   Musculoskeletal: Negative.   Skin: Negative.   Neurological:  Negative.   Endo/Heme/Allergies: Negative.   Psychiatric/Behavioral: Negative.      Physical Exam:  weight is 164 lb 8 oz (74.6 kg). His temporal temperature is 97.1 F (36.2 C) (abnormal). His blood pressure is 123/81 and his pulse is 65. His respiration is 16 and oxygen saturation is 98%.   Wt Readings from Last 3 Encounters:  01/19/20 164 lb 8 oz (74.6 kg)  07/21/19 162 lb 1.9 oz (73.5 kg)  06/13/19 164 lb (74.4 kg)    Physical Exam Vitals reviewed.  HENT:     Head: Normocephalic and atraumatic.  Eyes:     Pupils: Pupils are equal, round, and reactive to light.  Cardiovascular:     Rate and Rhythm: Normal rate and regular rhythm.     Heart sounds: Normal heart sounds.  Pulmonary:     Effort: Pulmonary effort is normal.     Breath sounds: Normal breath sounds.   Abdominal:     General: Bowel sounds are normal.     Palpations: Abdomen is soft.  Musculoskeletal:        General: No tenderness or deformity. Normal range of motion.     Cervical back: Normal range of motion.  Lymphadenopathy:     Cervical: No cervical adenopathy.  Skin:    General: Skin is warm and dry.     Findings: No erythema or rash.  Neurological:     Mental Status: He is alert and oriented to person, place, and time.  Psychiatric:        Behavior: Behavior normal.        Thought Content: Thought content normal.        Judgment: Judgment normal.      Lab Results  Component Value Date   WBC 16.6 (H) 01/19/2020   HGB 15.6 01/19/2020   HCT 49.3 01/19/2020   MCV 97.2 01/19/2020   PLT 81 (L) 01/19/2020   No results found for: FERRITIN, IRON, TIBC, UIBC, IRONPCTSAT Lab Results  Component Value Date   RETICCTPCT 1.0 06/15/2013   RBC 5.07 01/19/2020   RETICCTABS 47.6 06/15/2013   Lab Results  Component Value Date   KAPLAMBRATIO 1.83 (H) 10/31/2015   Lab Results  Component Value Date   IGGSERUM 895 01/25/2017   IGA 92 06/15/2013   IGMSERUM 113 01/25/2017   Lab Results  Component Value Date   TOTALPROTELP 6.5 06/15/2013   ALBUMINELP 61.7 06/15/2013   A1GS 3.5 06/15/2013   A2GS 9.8 06/15/2013   BETS 5.3 06/15/2013   BETA2SER 8.7 (H) 06/15/2013   GAMS 11.0 (L) 06/15/2013   MSPIKE Not Observed 01/25/2017   SPEI * 06/15/2013     Chemistry      Component Value Date/Time   NA 137 01/19/2020 0928   NA 142 06/28/2017 0855   K 4.8 01/19/2020 0928   K 4.5 06/28/2017 0855   CL 100 01/19/2020 0928   CL 99 06/28/2017 0855   CO2 33 (H) 01/19/2020 0928   CO2 32 06/28/2017 0855   BUN 16 01/19/2020 0928   BUN 15 06/28/2017 0855   CREATININE 1.02 01/19/2020 0928   CREATININE 1.0 06/28/2017 0855      Component Value Date/Time   CALCIUM 9.7 01/19/2020 0928   CALCIUM 9.2 06/28/2017 0855   ALKPHOS 60 01/19/2020 0928   ALKPHOS 74 06/28/2017 0855   AST 14 (L)  01/19/2020 0928   ALT 12 01/19/2020 0928   ALT 13 06/28/2017 0855   BILITOT 1.1 01/19/2020 0928     Impression  and Plan: Lucas [name] is very pleasant 79 year old-year-old male with CLL. We have not had to treat him now for over 20 years.  Everything looks quite stable from my point of view.  His platelet count is holding low but stable.  We will plan to get him back in 6 months.  I think this would be reasonable.    Hopefully, his diabetes is not going to be a problem for him.  He was knows that if he has a problem he can always come and see [place].   Volanda Napoleon, MD 6/4/202110:45 AM

## 2020-01-19 NOTE — Telephone Encounter (Signed)
Appointments scheduled calendar printed per 6/4 los 

## 2020-01-20 LAB — IGG, IGA, IGM
IgA: 103 mg/dL (ref 61–437)
IgG (Immunoglobin G), Serum: 1074 mg/dL (ref 603–1613)
IgM (Immunoglobulin M), Srm: 135 mg/dL (ref 15–143)

## 2020-01-22 LAB — KAPPA/LAMBDA LIGHT CHAINS
Kappa free light chain: 22 mg/L — ABNORMAL HIGH (ref 3.3–19.4)
Kappa, lambda light chain ratio: 2.06 — ABNORMAL HIGH (ref 0.26–1.65)
Lambda free light chains: 10.7 mg/L (ref 5.7–26.3)

## 2020-03-21 DIAGNOSIS — L820 Inflamed seborrheic keratosis: Secondary | ICD-10-CM | POA: Diagnosis not present

## 2020-03-21 DIAGNOSIS — D0439 Carcinoma in situ of skin of other parts of face: Secondary | ICD-10-CM | POA: Diagnosis not present

## 2020-03-21 DIAGNOSIS — L570 Actinic keratosis: Secondary | ICD-10-CM | POA: Diagnosis not present

## 2020-03-21 DIAGNOSIS — D485 Neoplasm of uncertain behavior of skin: Secondary | ICD-10-CM | POA: Diagnosis not present

## 2020-03-21 DIAGNOSIS — L821 Other seborrheic keratosis: Secondary | ICD-10-CM | POA: Diagnosis not present

## 2020-03-21 DIAGNOSIS — L578 Other skin changes due to chronic exposure to nonionizing radiation: Secondary | ICD-10-CM | POA: Diagnosis not present

## 2020-03-28 ENCOUNTER — Other Ambulatory Visit: Payer: Self-pay | Admitting: Cardiovascular Disease

## 2020-03-29 DIAGNOSIS — D0439 Carcinoma in situ of skin of other parts of face: Secondary | ICD-10-CM | POA: Diagnosis not present

## 2020-04-03 ENCOUNTER — Encounter: Payer: Self-pay | Admitting: Hematology & Oncology

## 2020-04-18 DIAGNOSIS — H0102B Squamous blepharitis left eye, upper and lower eyelids: Secondary | ICD-10-CM | POA: Diagnosis not present

## 2020-04-18 DIAGNOSIS — H401122 Primary open-angle glaucoma, left eye, moderate stage: Secondary | ICD-10-CM | POA: Diagnosis not present

## 2020-04-18 DIAGNOSIS — H0102A Squamous blepharitis right eye, upper and lower eyelids: Secondary | ICD-10-CM | POA: Diagnosis not present

## 2020-04-27 ENCOUNTER — Other Ambulatory Visit: Payer: Self-pay | Admitting: Cardiovascular Disease

## 2020-05-03 DIAGNOSIS — L570 Actinic keratosis: Secondary | ICD-10-CM | POA: Diagnosis not present

## 2020-05-18 DIAGNOSIS — Z23 Encounter for immunization: Secondary | ICD-10-CM | POA: Diagnosis not present

## 2020-05-31 DIAGNOSIS — E1139 Type 2 diabetes mellitus with other diabetic ophthalmic complication: Secondary | ICD-10-CM | POA: Diagnosis not present

## 2020-05-31 DIAGNOSIS — Z125 Encounter for screening for malignant neoplasm of prostate: Secondary | ICD-10-CM | POA: Diagnosis not present

## 2020-05-31 DIAGNOSIS — E785 Hyperlipidemia, unspecified: Secondary | ICD-10-CM | POA: Diagnosis not present

## 2020-06-04 ENCOUNTER — Ambulatory Visit: Payer: Medicare Other | Attending: Internal Medicine

## 2020-06-04 DIAGNOSIS — Z23 Encounter for immunization: Secondary | ICD-10-CM

## 2020-06-04 NOTE — Progress Notes (Signed)
   Covid-19 Vaccination Clinic  Name:  Lucas [name]    MRN: [id number] DOB: [birth year]  06/04/2020  [name] was observed post Covid-19 immunization for 30 minutes based on pre-vaccination screening without incident. He was provided with Vaccine Information Sheet and instruction to access the V-Safe system.   [name] was instructed to call 911 with any severe reactions post vaccine: Marland Kitchen Difficulty breathing  . Swelling of face and throat  . A fast heartbeat  . A bad rash all over body  . Dizziness and weakness

## 2020-06-06 ENCOUNTER — Telehealth: Payer: Self-pay | Admitting: Cardiovascular Disease

## 2020-06-06 DIAGNOSIS — I4891 Unspecified atrial fibrillation: Secondary | ICD-10-CM | POA: Diagnosis not present

## 2020-06-06 DIAGNOSIS — C9100 Acute lymphoblastic leukemia not having achieved remission: Secondary | ICD-10-CM | POA: Diagnosis not present

## 2020-06-06 DIAGNOSIS — H8113 Benign paroxysmal vertigo, bilateral: Secondary | ICD-10-CM | POA: Diagnosis not present

## 2020-06-06 DIAGNOSIS — R82998 Other abnormal findings in urine: Secondary | ICD-10-CM | POA: Diagnosis not present

## 2020-06-06 DIAGNOSIS — Z0000 Encounter for general adult medical examination without abnormal findings: Secondary | ICD-10-CM | POA: Diagnosis not present

## 2020-06-06 DIAGNOSIS — E1149 Type 2 diabetes mellitus with other diabetic neurological complication: Secondary | ICD-10-CM | POA: Diagnosis not present

## 2020-06-06 DIAGNOSIS — H409 Unspecified glaucoma: Secondary | ICD-10-CM | POA: Diagnosis not present

## 2020-06-06 DIAGNOSIS — E785 Hyperlipidemia, unspecified: Secondary | ICD-10-CM | POA: Diagnosis not present

## 2020-06-06 NOTE — Telephone Encounter (Signed)
The patient called in with concerns of a low heart rate. He stated that this morning his blood pressure was 123/80 and heart rate was 54. While on the phone it was 81 which he states was his normal.   He stated that he was feeling light headed and that this happened last week as well. He also stated that when he takes his allergy medicine that it normally helps with the feeling of being light headed. He was wondering if it may be from allergies. Since he blood pressure and heart rate are at his normal, he has been advised to follow with PCP as well.   He is going to see his PCP today and will follow up with him with these concerns.  He will keep the office updated after the appointment.

## 2020-06-06 NOTE — Telephone Encounter (Signed)
Patient c/o Palpitations:  High priority if patient c/o lightheadedness, shortness of breath, or chest pain  1) How long have you had palpitations/irregular HR/ Afib? Are you having the symptoms now?  Heart rate is 54 and it started today  2) Are you currently experiencing lightheadedness, SOB or CP? Yes Lighteadedness, pt had covid booster Tuesday   3) Do you have a history of afib (atrial fibrillation) or irregular heart rhythm? yes  4) Have you checked your BP or HR? (document readings if available): 123/80 and his heart rate is 54  5) Are you experiencing any other symptoms? No other symptoms other then light headed

## 2020-06-24 ENCOUNTER — Encounter: Payer: Self-pay | Admitting: Gastroenterology

## 2020-06-24 ENCOUNTER — Telehealth: Payer: Self-pay

## 2020-06-24 ENCOUNTER — Ambulatory Visit (INDEPENDENT_AMBULATORY_CARE_PROVIDER_SITE_OTHER): Payer: Medicare Other | Admitting: Gastroenterology

## 2020-06-24 VITALS — BP 110/60 | HR 68 | Ht 71.0 in | Wt 174.0 lb

## 2020-06-24 DIAGNOSIS — Z800 Family history of malignant neoplasm of digestive organs: Secondary | ICD-10-CM | POA: Diagnosis not present

## 2020-06-24 DIAGNOSIS — Z7901 Long term (current) use of anticoagulants: Secondary | ICD-10-CM | POA: Diagnosis not present

## 2020-06-24 DIAGNOSIS — I4891 Unspecified atrial fibrillation: Secondary | ICD-10-CM

## 2020-06-24 MED ORDER — PLENVU 140 G PO SOLR: ORAL | 0 refills | Status: DC

## 2020-06-24 NOTE — Telephone Encounter (Signed)
   Primary Cardiologist: [name], MD  Chart reviewed as part of pre-operative protocol coverage.   Per pharmacy recommendations, patient can hold eliquis 2 days prior to his upcoming colonoscopy and should restart this medication when cleared to do so by his gastroenterologist.  I will route this recommendation to the requesting party via [place] fax function and remove from pre-op pool.  Please call with questions.  Abigail Butts, PA-C 06/24/2020, 5:08 PM

## 2020-06-24 NOTE — Telephone Encounter (Signed)
 [facility] Pre-operative Risk Assessment     Request for surgical clearance:     Endoscopy Procedure  What type of surgery is being performed?     Colonocopy  When is this surgery scheduled?     08/22/20  What type of clearance is required ?   Pharmacy  Are there any medications that need to be held prior to surgery and how long? Eliquis x2days  Practice name and name of physician performing surgery?      [facility]  What is your office phone and fax number?      Phone- [phone]  [phone]  Anesthesia type (None, local, MAC, general) ?       MAC

## 2020-06-24 NOTE — Patient Instructions (Signed)
If you are age 79 or older, your body mass index should be between 23-30. Your Body mass index is 24.27 kg/m. If this is out of the aforementioned range listed, please consider follow up with your Primary Care Provider.  If you are age 17 or younger, your body mass index should be between 19-25. Your Body mass index is 24.27 kg/m. If this is out of the aformentioned range listed, please consider follow up with your Primary Care Provider.   You have been scheduled for a colonoscopy. Please follow written instructions given to you at your visit today.  Please pick up your prep supplies at the pharmacy within the next 1-3 days. If you use inhalers (even only as needed), please bring them with you on the day of your procedure.  Due to recent changes in healthcare laws, you may see the results of your imaging and laboratory studies on MyChart before your provider has had a chance to review them.  We understand that in some cases there may be results that are confusing or concerning to you. Not all laboratory results come back in the same time frame and the provider may be waiting for multiple results in order to interpret others.  Please give [place] 48 hours in order for your provider to thoroughly review all the results before contacting the office for clarification of your results.   Thank you for choosing me and [facility].  Pricilla Riffle. Dagoberto Ligas., MD., Mar[facility] Regal

## 2020-06-24 NOTE — Progress Notes (Signed)
History of Present Illness: This is a 79 year old male referred by Burnard Bunting, MD for the evaluation of a family history of colon cancer.  He is on Eliquis for afib. Last colonoscopy in Feb 2015 showed 1 small polyp (path showed lymphoid aggregates) and small internal hemorrhoids.  He relates occasional loose stools from certain foods that have been problematic for years.  No change in this pattern.  No other gastrointestinal complaints.  A. fib has been stable. Denies weight loss, abdominal pain, constipation, change in stool caliber, melena, hematochezia, nausea, vomiting, dysphagia, reflux symptoms, chest pain.    Allergies  Allergen Reactions  . Other Anaphylaxis    Shellfish  . Shellfish-Derived Products Anaphylaxis  . Adhesive [Tape] Other (See Comments)    REDNESS  . Nsaids   . Shellfish Allergy Swelling  . Latex Rash   Outpatient Medications Prior to Visit  Medication Sig Dispense Refill  . brimonidine (ALPHAGAN) 0.2 % ophthalmic solution Place 1 drop into the left eye 2 (two) times daily.     . chlorpheniramine (CHLOR-TRIMETON) 4 MG tablet Take 4 mg by mouth daily as needed for allergies.    Marland Kitchen ELIQUIS 5 MG TABS tablet TAKE 1 TABLET TWICE A DAY 180 tablet 2  . metFORMIN (GLUCOPHAGE) 500 MG tablet Take 500 mg by mouth daily.     . metoprolol succinate (TOPROL-XL) 25 MG 24 hr tablet TAKE 1 TABLET DAILY 90 tablet 1  . sildenafil (VIAGRA) 50 MG tablet Take 1 tablet (50 mg total) by mouth daily as needed for erectile dysfunction. 10 tablet 0  . timolol (TIMOPTIC) 0.5 % ophthalmic solution Place 1 drop into the left eye daily. Left eye    . meclizine (ANTIVERT) 25 MG tablet Take 25 mg by mouth 3 (three) times daily as needed. (Patient not taking: Reported on 06/24/2020)    . aspirin 325 MG tablet aspirin 325 mg tablet  Take 1 tablet every day by oral route for 30 days.    . diclofenac Sodium (VOLTAREN) 1 % GEL Voltaren 1 % topical gel  APPLY 2 GRAMS TO THE AFFECTED AREA(S) BY  TOPICAL ROUTE 4 TIMES PER DAY    . metroNIDAZOLE (FLAGYL) 250 MG tablet Take 250 mg by mouth 3 (three) times daily.     No facility-administered medications prior to visit.   Past Medical History:  Diagnosis Date  . Atrial fibrillation (HCC)    paroxysmal; Normal Coronary Arteries on Cath Jan 2013  . CLL (chronic lymphoblastic leukemia) 09/03/2011   dx 1999  . Diabetes mellitus,borderline 09/10/2011   Past Surgical History:  Procedure Laterality Date  . CARDIOVASCULAR STRESS TEST  09/15/2011   Mild-moderate perfusion defect due to infarct/scar w/ moderate perinfarct ischemia seen in basal inferoseptal, basal inferior, mid inferoseptal, mid inferior and apical inferior regions. Observed defect may in part be attributable to diagphragmatic attenuation, however there is also reversibility that would suggest a true defect. EKG negative for ischemia.  Marland Kitchen CATARACT EXTRACTION  2012  . CYST REMOVAL TRUNK  2010   back  . GUM SURGERY  111/2021   for a crown  . LEFT HEART CATHETERIZATION WITH CORONARY ANGIOGRAM N/A 09/17/2011   Procedure: LEFT HEART CATHETERIZATION WITH CORONARY ANGIOGRAM;  Surgeon: [name], MD;  Location: [facility] CATH LAB: Normal Coronary Arteries. EF ~60%. No RWMA  . ROTATOR CUFF REPAIR  1999  . TONSILLECTOMY    . TRANSTHORACIC ECHOCARDIOGRAM  09/15/2011; Dec 2014   EF >55%, Normal LV systolic function; EF  60-65%. GR 1 DD. No RWMA.  Borderline left atrial dilation. Otherwise normal   Social History   Socioeconomic History  . Marital status: Married    Spouse name: Not on file  . Number of children: 1  . Years of education: Not on file  . Highest education level: Not on file  Occupational History    Employer: RETIRED  Tobacco Use  . Smoking status: Former Smoker    Packs/day: 1.00    Years: 3.00    Pack years: 3.00    Types: Cigarettes    Start date: 09/19/1962    Quit date: 12/17/1965    Years since quitting: 54.5  . Smokeless tobacco: Never Used  . Tobacco comment:  quit 47 yeras ago  Vaping Use  . Vaping Use: Never used  Substance and Sexual Activity  . Alcohol use: Yes    Alcohol/week: 4.0 standard drinks    Types: 4 Glasses of wine per week  . Drug use: No  . Sexual activity: Yes  Other Topics Concern  . Not on file  Social History Narrative   Married Caucasian male, father to 73 stepchild, grandfather to 2 step-grandchildren.   Lives with his wife.   Travels a lot & enjoys photography.   Social Determinants of Health   Financial Resource Strain:   . Difficulty of Paying Living Expenses: Not on file  Food Insecurity:   . Worried About Charity fundraiser in the Last Year: Not on file  . Ran Out of Food in the Last Year: Not on file  Transportation Needs:   . Lack of Transportation (Medical): Not on file  . Lack of Transportation (Non-Medical): Not on file  Physical Activity:   . Days of Exercise per Week: Not on file  . Minutes of Exercise per Session: Not on file  Stress:   . Feeling of Stress : Not on file  Social Connections:   . Frequency of Communication with Friends and Family: Not on file  . Frequency of Social Gatherings with Friends and Family: Not on file  . Attends Religious Services: Not on file  . Active Member of Clubs or Organizations: Not on file  . Attends Archivist Meetings: Not on file  . Marital Status: Not on file   Family History  Problem Relation Age of Onset  . Cancer Mother        Multiple myeloma  . Colon cancer Mother   . Heart attack Father        Died age 80  . Stroke Father   . Cancer Sister        Breast cancer  . Heart attack Paternal Grandmother   . Cancer Paternal Grandmother        Breast cancer  . Stroke Paternal Grandfather       Review of Systems: Pertinent positive and negative review of systems were noted in the above HPI section. All other review of systems were otherwise negative.   Physical Exam: General: Well developed, well nourished, no acute distress Head:  Normocephalic and atraumatic Eyes:  sclerae anicteric, EOMI Ears: Normal auditory acuity Mouth: Not examined, mask on during Covid-19 pandemic Neck: Supple, no masses or thyromegaly Lungs: Clear throughout to auscultation Heart: Regular rate and rhythm; no murmurs, rubs or bruits Abdomen: Soft, non tender and non distended. No masses, hepatosplenomegaly or hernias noted. Normal Bowel sounds Rectal: Deferred to colonoscopy  Musculoskeletal: Symmetrical with no gross deformities  Skin: No lesions on visible extremities Pulses:  Normal pulses noted Extremities: No clubbing, cyanosis, edema or deformities noted Neurological: Alert oriented x 4, grossly nonfocal Cervical Nodes:  No significant cervical adenopathy Inguinal Nodes: No significant inguinal adenopathy Psychological:  Alert and cooperative. Normal mood and affect   Assessment and Recommendations:  1. Family history of colon cancer, first degree relative.  Its been over 6 years since his last colonoscopy.  Schedule colonoscopy. The risks (including bleeding, perforation, infection, missed lesions, medication reactions and possible hospitalization or surgery if complications occur), benefits, and alternatives to colonoscopy with possible biopsy and possible polypectomy were discussed with the patient and they consent to proceed.   2. Afib. Hold Eliquis 2 days before procedure - will instruct when and how to resume after procedure. Low but real risk of cardiovascular event such as heart attack, stroke, embolism, thrombosis or ischemia/infarct of other organs off Eliquis explained and need to seek urgent help if this occurs. The patient consents to proceed. Will communicate by phone or EMR with patient's prescribing provider to confirm that holding Eliquis is reasonable in this case.    cc: Burnard Bunting, MD [id number] [place],  [place] [phone]

## 2020-06-24 NOTE — Telephone Encounter (Signed)
Patient with diagnosis of afib on Eliquis for anticoagulation.    Procedure:  Colonocopy Date of procedure: 08/22/2020    CHA2DS2-VASc Score = 3  This indicates a 3.2% annual risk of stroke. The patient's score is based upon: CHF History: 0 HTN History: 0 Diabetes History: 1 Stroke History: 0 Vascular Disease History: 0 Age Score: 2 Gender Score: 0     CrCl 62 ml/min  Per office protocol, patient can hold Eliquis for 2 days prior to procedure.

## 2020-06-25 NOTE — Telephone Encounter (Signed)
Informed patient to hold Eliquis 2 days prior to his procedure. Patient stated that he understood.

## 2020-07-05 ENCOUNTER — Telehealth: Payer: Self-pay

## 2020-07-05 NOTE — Telephone Encounter (Signed)
Pt called to r/s his appt from 07/26/20... AOM

## 2020-07-19 ENCOUNTER — Other Ambulatory Visit: Payer: Medicare Other

## 2020-07-19 ENCOUNTER — Ambulatory Visit: Payer: Medicare Other | Admitting: Hematology & Oncology

## 2020-07-23 DIAGNOSIS — C44622 Squamous cell carcinoma of skin of right upper limb, including shoulder: Secondary | ICD-10-CM | POA: Diagnosis not present

## 2020-07-23 DIAGNOSIS — D225 Melanocytic nevi of trunk: Secondary | ICD-10-CM | POA: Diagnosis not present

## 2020-07-23 DIAGNOSIS — L820 Inflamed seborrheic keratosis: Secondary | ICD-10-CM | POA: Diagnosis not present

## 2020-07-23 DIAGNOSIS — Z85828 Personal history of other malignant neoplasm of skin: Secondary | ICD-10-CM | POA: Diagnosis not present

## 2020-07-23 DIAGNOSIS — L853 Xerosis cutis: Secondary | ICD-10-CM | POA: Diagnosis not present

## 2020-07-23 DIAGNOSIS — B353 Tinea pedis: Secondary | ICD-10-CM | POA: Diagnosis not present

## 2020-07-23 DIAGNOSIS — L910 Hypertrophic scar: Secondary | ICD-10-CM | POA: Diagnosis not present

## 2020-07-23 DIAGNOSIS — L821 Other seborrheic keratosis: Secondary | ICD-10-CM | POA: Diagnosis not present

## 2020-07-23 DIAGNOSIS — D2261 Melanocytic nevi of right upper limb, including shoulder: Secondary | ICD-10-CM | POA: Diagnosis not present

## 2020-07-23 DIAGNOSIS — L570 Actinic keratosis: Secondary | ICD-10-CM | POA: Diagnosis not present

## 2020-07-23 DIAGNOSIS — D485 Neoplasm of uncertain behavior of skin: Secondary | ICD-10-CM | POA: Diagnosis not present

## 2020-07-23 DIAGNOSIS — L578 Other skin changes due to chronic exposure to nonionizing radiation: Secondary | ICD-10-CM | POA: Diagnosis not present

## 2020-07-26 ENCOUNTER — Ambulatory Visit: Payer: Medicare Other | Admitting: Hematology & Oncology

## 2020-07-26 ENCOUNTER — Other Ambulatory Visit: Payer: Medicare Other

## 2020-08-06 ENCOUNTER — Other Ambulatory Visit: Payer: Medicare Other

## 2020-08-06 DIAGNOSIS — Z20822 Contact with and (suspected) exposure to covid-19: Secondary | ICD-10-CM

## 2020-08-06 DIAGNOSIS — Z1212 Encounter for screening for malignant neoplasm of rectum: Secondary | ICD-10-CM | POA: Diagnosis not present

## 2020-08-08 LAB — NOVEL CORONAVIRUS, NAA: SARS-CoV-2, NAA: NOT DETECTED

## 2020-08-08 LAB — SARS-COV-2, NAA 2 DAY TAT

## 2020-08-13 ENCOUNTER — Inpatient Hospital Stay (HOSPITAL_BASED_OUTPATIENT_CLINIC_OR_DEPARTMENT_OTHER): Payer: Medicare Other | Admitting: Hematology & Oncology

## 2020-08-13 ENCOUNTER — Telehealth: Payer: Self-pay | Admitting: Hematology & Oncology

## 2020-08-13 ENCOUNTER — Inpatient Hospital Stay: Payer: Medicare Other | Attending: Hematology & Oncology

## 2020-08-13 ENCOUNTER — Encounter: Payer: Self-pay | Admitting: Hematology & Oncology

## 2020-08-13 ENCOUNTER — Other Ambulatory Visit: Payer: Self-pay

## 2020-08-13 ENCOUNTER — Telehealth: Payer: Self-pay

## 2020-08-13 VITALS — BP 121/76 | HR 67 | Temp 97.8°F | Resp 18 | Wt 167.2 lb

## 2020-08-13 DIAGNOSIS — I480 Paroxysmal atrial fibrillation: Secondary | ICD-10-CM | POA: Diagnosis not present

## 2020-08-13 DIAGNOSIS — C9111 Chronic lymphocytic leukemia of B-cell type in remission: Secondary | ICD-10-CM | POA: Diagnosis not present

## 2020-08-13 DIAGNOSIS — I4891 Unspecified atrial fibrillation: Secondary | ICD-10-CM | POA: Diagnosis not present

## 2020-08-13 DIAGNOSIS — C9110 Chronic lymphocytic leukemia of B-cell type not having achieved remission: Secondary | ICD-10-CM

## 2020-08-13 DIAGNOSIS — Z7901 Long term (current) use of anticoagulants: Secondary | ICD-10-CM | POA: Diagnosis not present

## 2020-08-13 LAB — CMP (CANCER CENTER ONLY)
ALT: 11 U/L (ref 0–44)
AST: 13 U/L — ABNORMAL LOW (ref 15–41)
Albumin: 4.3 g/dL (ref 3.5–5.0)
Alkaline Phosphatase: 64 U/L (ref 38–126)
Anion gap: 4 — ABNORMAL LOW (ref 5–15)
BUN: 16 mg/dL (ref 8–23)
CO2: 35 mmol/L — ABNORMAL HIGH (ref 22–32)
Calcium: 9.6 mg/dL (ref 8.9–10.3)
Chloride: 101 mmol/L (ref 98–111)
Creatinine: 1.02 mg/dL (ref 0.61–1.24)
GFR, Estimated: >60 mL/min
Glucose, Bld: 167 mg/dL — ABNORMAL HIGH (ref 70–99)
Potassium: 4.7 mmol/L (ref 3.5–5.1)
Sodium: 140 mmol/L (ref 135–145)
Total Bilirubin: 0.7 mg/dL (ref 0.3–1.2)
Total Protein: 6.7 g/dL (ref 6.5–8.1)

## 2020-08-13 LAB — CBC WITH DIFFERENTIAL (CANCER CENTER ONLY)
Abs Immature Granulocytes: 0.04 10*3/uL (ref 0.00–0.07)
Basophils Absolute: 0 10*3/uL (ref 0.0–0.1)
Basophils Relative: 0 %
Eosinophils Absolute: 0 10*3/uL (ref 0.0–0.5)
Eosinophils Relative: 0 %
HCT: 47.7 % (ref 39.0–52.0)
Hemoglobin: 15 g/dL (ref 13.0–17.0)
Immature Granulocytes: 0 %
Lymphocytes Relative: 82 %
Lymphs Abs: 14.5 10*3/uL — ABNORMAL HIGH (ref 0.7–4.0)
MCH: 30 pg (ref 26.0–34.0)
MCHC: 31.4 g/dL (ref 30.0–36.0)
MCV: 95.4 fL (ref 80.0–100.0)
Monocytes Absolute: 0.5 10*3/uL (ref 0.1–1.0)
Monocytes Relative: 3 %
Neutro Abs: 2.7 10*3/uL (ref 1.7–7.7)
Neutrophils Relative %: 15 %
Platelet Count: 91 10*3/uL — ABNORMAL LOW (ref 150–400)
RBC: 5 MIL/uL (ref 4.22–5.81)
RDW: 13.7 % (ref 11.5–15.5)
WBC Count: 17.7 10*3/uL — ABNORMAL HIGH (ref 4.0–10.5)
nRBC: 0 % (ref 0.0–0.2)

## 2020-08-13 LAB — LACTATE DEHYDROGENASE: LDH: 99 U/L (ref 98–192)

## 2020-08-13 LAB — SAVE SMEAR(SSMR), FOR PROVIDER SLIDE REVIEW

## 2020-08-13 NOTE — Telephone Encounter (Signed)
appts made and printed for pt per 08/13/20 los     aom

## 2020-08-13 NOTE — Telephone Encounter (Signed)
Appointments scheduled calendar printed per 12/28 los 

## 2020-08-13 NOTE — Progress Notes (Signed)
Hematology and Oncology Follow Up Visit  ED MANDICH [id number] [birth year] 79 y.o. 08/13/2020   Principle Diagnosis:  1. Chronic lymphocytic leukemia - stage C  Current Therapy:   Observation    Interim History:  Lucas [name] is here today for a follow-up.  Unfortunately, a brother-in-law died yesterday.  He was in [place], [place].  He had acute myeloid leukemia.  He apparently developed pneumonia.  Lucas [name] has been doing well himself.  He goes to a [employer].  He is really helped Lucas [name] out.  He has had no problems with the atrial fibrillation.  He is on Eliquis for this.  He has had no syncope.  He has had no dizziness.  He has had no cough or shortness of breath.  He checked himself last week for Covid.  He was negative.  He has had no problems with bleeding.  He has had no leg swelling.  Hopefully, next year, he will be able to go on his [place] cruise.  Currently, his performance status is ECOG 1.    Medications:  Allergies as of 08/13/2020      Reactions   Other Anaphylaxis   Shellfish   Shellfish-derived Products Anaphylaxis   Adhesive [tape] Other (See Comments)   REDNESS   Nsaids    Shellfish Allergy Swelling   Latex Rash      Medication List       Accurate as of August 13, 2020 10:17 AM. If you have any questions, ask your nurse or doctor.        brimonidine 0.2 % ophthalmic solution Commonly known as: ALPHAGAN Place 1 drop into the left eye 2 (two) times daily.   chlorpheniramine 4 MG tablet Commonly known as: CHLOR-TRIMETON Take 4 mg by mouth daily as needed for allergies.   Eliquis 5 MG Tabs tablet Generic drug: apixaban TAKE 1 TABLET TWICE A DAY   ketoconazole 2 % cream Commonly known as: NIZORAL   meclizine 25 MG tablet Commonly known as: ANTIVERT Take 25 mg by mouth 3 (three) times daily as needed.   metFORMIN 500 MG tablet Commonly known as: GLUCOPHAGE Take 500 mg by mouth daily.   metoprolol  succinate 25 MG 24 hr tablet Commonly known as: TOPROL-XL TAKE 1 TABLET DAILY   Plenvu 140 g Solr Generic drug: PEG-KCl-NaCl-NaSulf-Na Asc-C Use as directed   sildenafil 50 MG tablet Commonly known as: Viagra Take 1 tablet (50 mg total) by mouth daily as needed for erectile dysfunction.   timolol 0.5 % ophthalmic solution Commonly known as: TIMOPTIC Place 1 drop into the left eye daily. Left eye       Allergies:  Allergies  Allergen Reactions  . Other Anaphylaxis    Shellfish  . Shellfish-Derived Products Anaphylaxis  . Adhesive [Tape] Other (See Comments)    REDNESS  . Nsaids   . Shellfish Allergy Swelling  . Latex Rash    Past Medical History, Surgical history, Social history, and Family History were reviewed and updated.  Review of Systems: Review of Systems  Constitutional: Negative.   HENT: Negative.   Eyes: Negative.   Respiratory: Negative.   Cardiovascular: Negative.   Gastrointestinal: Negative.   Genitourinary: Negative.   Musculoskeletal: Negative.   Skin: Negative.   Neurological: Negative.   Endo/Heme/Allergies: Negative.   Psychiatric/Behavioral: Negative.      Physical Exam:  weight is 167 lb 4 oz (75.9 kg). His oral temperature is 97.8 F (36.6 C). His blood pressure is 121/76 and his pulse  is 67. His respiration is 18 and oxygen saturation is 97%.   Wt Readings from Last 3 Encounters:  08/13/20 167 lb 4 oz (75.9 kg)  06/24/20 174 lb (78.9 kg)  01/19/20 164 lb 8 oz (74.6 kg)    Physical Exam Vitals reviewed.  HENT:     Head: Normocephalic and atraumatic.  Eyes:     Pupils: Pupils are equal, round, and reactive to light.  Cardiovascular:     Rate and Rhythm: Normal rate and regular rhythm.     Heart sounds: Normal heart sounds.  Pulmonary:     Effort: Pulmonary effort is normal.     Breath sounds: Normal breath sounds.  Abdominal:     General: Bowel sounds are normal.     Palpations: Abdomen is soft.  Musculoskeletal:         General: No tenderness or deformity. Normal range of motion.     Cervical back: Normal range of motion.  Lymphadenopathy:     Cervical: No cervical adenopathy.  Skin:    General: Skin is warm and dry.     Findings: No erythema or rash.  Neurological:     Mental Status: He is alert and oriented to person, place, and time.  Psychiatric:        Behavior: Behavior normal.        Thought Content: Thought content normal.        Judgment: Judgment normal.      Lab Results  Component Value Date   WBC 17.7 (H) 08/13/2020   HGB 15.0 08/13/2020   HCT 47.7 08/13/2020   MCV 95.4 08/13/2020   PLT 91 (L) 08/13/2020   No results found for: FERRITIN, IRON, TIBC, UIBC, IRONPCTSAT Lab Results  Component Value Date   RETICCTPCT 1.0 06/15/2013   RBC 5.00 08/13/2020   RETICCTABS 47.6 06/15/2013   Lab Results  Component Value Date   KPAFRELGTCHN 22.0 (H) 01/19/2020   LAMBDASER 10.7 01/19/2020   KAPLAMBRATIO 2.06 (H) 01/19/2020   Lab Results  Component Value Date   IGGSERUM 1,074 01/19/2020   IGA 103 01/19/2020   IGMSERUM 135 01/19/2020   Lab Results  Component Value Date   TOTALPROTELP 6.5 06/15/2013   ALBUMINELP 61.7 06/15/2013   A1GS 3.5 06/15/2013   A2GS 9.8 06/15/2013   BETS 5.3 06/15/2013   BETA2SER 8.7 (H) 06/15/2013   GAMS 11.0 (L) 06/15/2013   MSPIKE Not Observed 01/25/2017   SPEI * 06/15/2013     Chemistry      Component Value Date/Time   NA 140 08/13/2020 0816   NA 142 06/28/2017 0855   K 4.7 08/13/2020 0816   K 4.5 06/28/2017 0855   CL 101 08/13/2020 0816   CL 99 06/28/2017 0855   CO2 35 (H) 08/13/2020 0816   CO2 32 06/28/2017 0855   BUN 16 08/13/2020 0816   BUN 15 06/28/2017 0855   CREATININE 1.02 08/13/2020 0816   CREATININE 1.0 06/28/2017 0855      Component Value Date/Time   CALCIUM 9.6 08/13/2020 0816   CALCIUM 9.2 06/28/2017 0855   ALKPHOS 64 08/13/2020 0816   ALKPHOS 74 06/28/2017 0855   AST 13 (L) 08/13/2020 0816   ALT 11 08/13/2020 0816    ALT 13 06/28/2017 0855   BILITOT 0.7 08/13/2020 0816     Impression and Plan: Lucas [name] is very pleasant 79 year old male with CLL. We have not had to treat him now for over 20 years.    Everything looks quite stable from  my point of view.  His platelet count is holding low but stable.  There is nothing on his physical exam that would suggest any kind of disease progression.  We will plan to get him back in 6 months.  I think this would be reasonable.  I think when we see him back, he would have been on his [place] cruise.   Volanda Napoleon, MD 12/28/202110:17 AM

## 2020-08-14 LAB — IGG, IGA, IGM
IgA: 108 mg/dL (ref 61–437)
IgG (Immunoglobin G), Serum: 978 mg/dL (ref 603–1613)
IgM (Immunoglobulin M), Srm: 122 mg/dL (ref 15–143)

## 2020-08-22 ENCOUNTER — Ambulatory Visit (AMBULATORY_SURGERY_CENTER): Payer: Medicare Other | Admitting: Gastroenterology

## 2020-08-22 ENCOUNTER — Other Ambulatory Visit: Payer: Self-pay

## 2020-08-22 ENCOUNTER — Encounter: Payer: Self-pay | Admitting: Gastroenterology

## 2020-08-22 VITALS — BP 99/62 | HR 68 | Temp 96.0°F | Resp 20 | Ht 71.0 in | Wt 174.0 lb

## 2020-08-22 DIAGNOSIS — Z86010 Personal history of colonic polyps: Secondary | ICD-10-CM | POA: Diagnosis not present

## 2020-08-22 DIAGNOSIS — K635 Polyp of colon: Secondary | ICD-10-CM | POA: Diagnosis not present

## 2020-08-22 DIAGNOSIS — Z800 Family history of malignant neoplasm of digestive organs: Secondary | ICD-10-CM

## 2020-08-22 DIAGNOSIS — Z1211 Encounter for screening for malignant neoplasm of colon: Secondary | ICD-10-CM | POA: Diagnosis not present

## 2020-08-22 DIAGNOSIS — D123 Benign neoplasm of transverse colon: Secondary | ICD-10-CM

## 2020-08-22 MED ORDER — SODIUM CHLORIDE 0.9 % IV SOLN: 500.0000 mL | Freq: Once | INTRAVENOUS | Status: DC

## 2020-08-22 NOTE — Patient Instructions (Signed)
YOU HAD AN ENDOSCOPIC PROCEDURE TODAY AT THE [facility]:   Refer to the procedure report that was given to you for any specific questions about what was found during the examination.  If the procedure report does not answer your questions, please call your gastroenterologist to clarify.  If you requested that your care partner not be given the details of your procedure findings, then the procedure report has been included in a sealed envelope for you to review at your convenience later.  YOU SHOULD EXPECT: Some feelings of bloating in the abdomen. Passage of more gas than usual.  Walking can help get rid of the air that was put into your GI tract during the procedure and reduce the bloating. If you had a lower endoscopy (such as a colonoscopy or flexible sigmoidoscopy) you may notice spotting of blood in your stool or on the toilet paper. If you underwent a bowel prep for your procedure, you may not have a normal bowel movement for a few days.  **Handouts given on Polyps, Hemorrhoids and Diverticulosis**  Please Note:  You might notice some irritation and congestion in your nose or some drainage.  This is from the oxygen used during your procedure.  There is no need for concern and it should clear up in a day or so.  SYMPTOMS TO REPORT IMMEDIATELY:   Following lower endoscopy (colonoscopy or flexible sigmoidoscopy):  Excessive amounts of blood in the stool  Significant tenderness or worsening of abdominal pains  Swelling of the abdomen that is new, acute  Fever of 100F or higher   For urgent or emergent issues, a gastroenterologist can be reached at any hour by calling [phone]. Do not use MyChart messaging for urgent concerns.    DIET:  We do recommend a small meal at first, but then you may proceed to your regular diet.  Drink plenty of fluids but you should avoid alcoholic beverages for 24 hours.  ACTIVITY:  You should plan to take it easy for the rest of today and you  should NOT DRIVE or use heavy machinery until tomorrow (because of the sedation medicines used during the test).    FOLLOW UP: Our staff will call the number listed on your records 48-72 hours following your procedure to check on you and address any questions or concerns that you may have regarding the information given to you following your procedure. If we do not reach you, we will leave a message.  We will attempt to reach you two times.  During this call, we will ask if you have developed any symptoms of COVID 19. If you develop any symptoms (ie: fever, flu-like symptoms, shortness of breath, cough etc.) before then, please call [phone].  If you test positive for Covid 19 in the 2 weeks post procedure, please call and report this information to us.    If any biopsies were taken you will be contacted by phone or by letter within the next 1-3 weeks.  Please call us at [phone] if you have not heard about the biopsies in 3 weeks.    SIGNATURES/CONFIDENTIALITY: You and/or your care partner have signed paperwork which will be entered into your electronic medical record.  These signatures attest to the fact that that the information above on your After Visit Summary has been reviewed and is understood.  Full responsibility of the confidentiality of this discharge information lies with you and/or your care-partner. 

## 2020-08-22 NOTE — Progress Notes (Signed)
PT taken to PACU. Monitors in place. VSS. Report given to RN. 

## 2020-08-22 NOTE — Op Note (Addendum)
[facility] Patient Name: Lucas [name] Procedure Date: 08/22/2020 7:52 AM MRN: [id number] Endoscopist: Ladene Artist , MD Age: 80 Referring MD:  Date of Birth: September 21, 1940 Gender: Male Account #: [id number] Procedure:                Colonoscopy Indications:              Screening in patient at increased risk: Family                            history of 1st-degree relative with colorectal                            cancer Medicines:                Monitored Anesthesia Care Procedure:                Pre-Anesthesia Assessment:                           - Prior to the procedure, a History and Physical                            was performed, and patient medications and                            allergies were reviewed. The patient's tolerance of                            previous anesthesia was also reviewed. The risks                            and benefits of the procedure and the sedation                            options and risks were discussed with the patient.                            All questions were answered, and informed consent                            was obtained. Prior Anticoagulants: The patient has                            taken Eliquis (apixaban), last dose was 2 days                            prior to procedure. ASA Grade Assessment: II - A                            patient with mild systemic disease. After reviewing                            the risks and benefits, the patient was deemed in  satisfactory condition to undergo the procedure.                           After obtaining informed consent, the colonoscope                            was passed under direct vision. Throughout the                            procedure, the patient's blood pressure, pulse, and                            oxygen saturations were monitored continuously. The                            Olympus CF-HQ190L ([id number]) Colonoscope was                             introduced through the anus and advanced to the the                            cecum, identified by appendiceal orifice and                            ileocecal valve. The ileocecal valve, appendiceal                            orifice, and rectum were photographed. The quality                            of the bowel preparation was excellent. The                            colonoscopy was performed without difficulty. The                            patient tolerated the procedure well. Scope In: 8:11:17 AM Scope Out: 8:28:32 AM Scope Withdrawal Time: 0 hours 13 minutes 25 seconds  Total Procedure Duration: 0 hours 17 minutes 15 seconds  Findings:                 The perianal and digital rectal examinations were                            normal.                           A 7 mm polyp was found in the splenic flexure. The                            polyp was sessile. The polyp was removed with a                            cold snare. Resection and retrieval were complete.  A few small-mouthed diverticula were found in the                            left colon.                           Internal hemorrhoids were found during                            retroflexion. The hemorrhoids were small and Grade                            I (internal hemorrhoids that do not prolapse).                           The exam was otherwise without abnormality on                            direct and retroflexion views. Complications:            No immediate complications. Estimated blood loss:                            None. Estimated Blood Loss:     Estimated blood loss: none. Impression:               - One 7 mm polyp at the splenic flexure, removed                            with a cold snare. Resected and retrieved.                           - Mild diverticulosis in the left colon.                           - Internal hemorrhoids.                            - The examination was otherwise normal on direct                            and retroflexion views. Recommendation:           - Patient has a contact number available for                            emergencies. The signs and symptoms of potential                            delayed complications were discussed with the                            patient. Return to normal activities tomorrow.                            Written discharge instructions were provided to the  patient.                           - High fiber diet.                           - Continue present medications.                           - Await pathology results.                           - No repeat colonoscopy due to age.                           - Resume Eliquis (apixaban) at prior dose in 2                            days. Refer to managing physician for further                            adjustment of therapy.                           - No aspirin, ibuprofen, naproxen, or other                            non-steroidal anti-inflammatory drugs for 2 weeks                            after polyp removal. Ladene Artist, MD 08/22/2020 8:38:09 AM This report has been signed electronically.

## 2020-08-22 NOTE — Progress Notes (Signed)
VS taken by BC 

## 2020-08-22 NOTE — Progress Notes (Signed)
Called to room to assist during endoscopic procedure.  Patient ID and intended procedure confirmed with present staff. Received instructions for my participation in the procedure from the performing physician.  

## 2020-08-26 ENCOUNTER — Telehealth: Payer: Self-pay | Admitting: *Deleted

## 2020-08-26 DIAGNOSIS — H4089 Other specified glaucoma: Secondary | ICD-10-CM | POA: Diagnosis not present

## 2020-08-26 DIAGNOSIS — H40043 Steroid responder, bilateral: Secondary | ICD-10-CM | POA: Diagnosis not present

## 2020-08-26 NOTE — Telephone Encounter (Signed)
  Follow up Call-  Call back number 08/22/2020  Post procedure Call Back phone  # [phone]  Permission to leave phone message Yes  Some recent data might be hidden     Patient questions:  Do you have a fever, pain , or abdominal swelling? No. Pain Score  0 *  Have you tolerated food without any problems? Yes.    Have you been able to return to your normal activities? Yes.    Do you have any questions about your discharge instructions: Diet   No. Medications  No. Follow up visit  No.  Do you have questions or concerns about your Care? No.  Actions: * If pain score is 4 or above: No action needed, pain <4.  1. Have you developed a fever since your procedure? no  2.   Have you had an respiratory symptoms (SOB or cough) since your procedure? no  3.   Have you tested positive for COVID 19 since your procedure no  4.   Have you had any family members/close contacts diagnosed with the COVID 19 since your procedure?  no   If yes to any of these questions please route to Joylene John, RN and Joella Prince, RN

## 2020-08-29 ENCOUNTER — Encounter: Payer: Self-pay | Admitting: Gastroenterology

## 2020-09-04 DIAGNOSIS — M5416 Radiculopathy, lumbar region: Secondary | ICD-10-CM | POA: Diagnosis not present

## 2020-09-10 DIAGNOSIS — L988 Other specified disorders of the skin and subcutaneous tissue: Secondary | ICD-10-CM | POA: Diagnosis not present

## 2020-09-10 DIAGNOSIS — C44622 Squamous cell carcinoma of skin of right upper limb, including shoulder: Secondary | ICD-10-CM | POA: Diagnosis not present

## 2020-09-12 DIAGNOSIS — N401 Enlarged prostate with lower urinary tract symptoms: Secondary | ICD-10-CM | POA: Diagnosis not present

## 2020-09-12 DIAGNOSIS — G629 Polyneuropathy, unspecified: Secondary | ICD-10-CM | POA: Diagnosis not present

## 2020-09-12 DIAGNOSIS — C9100 Acute lymphoblastic leukemia not having achieved remission: Secondary | ICD-10-CM | POA: Diagnosis not present

## 2020-09-12 DIAGNOSIS — Z7901 Long term (current) use of anticoagulants: Secondary | ICD-10-CM | POA: Diagnosis not present

## 2020-09-12 DIAGNOSIS — I4891 Unspecified atrial fibrillation: Secondary | ICD-10-CM | POA: Diagnosis not present

## 2020-09-12 DIAGNOSIS — E1149 Type 2 diabetes mellitus with other diabetic neurological complication: Secondary | ICD-10-CM | POA: Diagnosis not present

## 2020-09-12 DIAGNOSIS — E785 Hyperlipidemia, unspecified: Secondary | ICD-10-CM | POA: Diagnosis not present

## 2020-09-12 DIAGNOSIS — E1139 Type 2 diabetes mellitus with other diabetic ophthalmic complication: Secondary | ICD-10-CM | POA: Diagnosis not present

## 2020-09-16 ENCOUNTER — Other Ambulatory Visit: Payer: Self-pay | Admitting: Cardiovascular Disease

## 2020-10-02 DIAGNOSIS — L570 Actinic keratosis: Secondary | ICD-10-CM | POA: Diagnosis not present

## 2020-10-14 DIAGNOSIS — H26492 Other secondary cataract, left eye: Secondary | ICD-10-CM | POA: Diagnosis not present

## 2020-10-23 DIAGNOSIS — L578 Other skin changes due to chronic exposure to nonionizing radiation: Secondary | ICD-10-CM | POA: Diagnosis not present

## 2020-10-23 DIAGNOSIS — L570 Actinic keratosis: Secondary | ICD-10-CM | POA: Diagnosis not present

## 2020-11-06 ENCOUNTER — Other Ambulatory Visit: Payer: Self-pay

## 2020-11-06 ENCOUNTER — Encounter: Payer: Self-pay | Admitting: Cardiovascular Disease

## 2020-11-06 ENCOUNTER — Ambulatory Visit (INDEPENDENT_AMBULATORY_CARE_PROVIDER_SITE_OTHER): Payer: Medicare Other | Admitting: Cardiovascular Disease

## 2020-11-06 VITALS — BP 130/78 | HR 58 | Ht 72.0 in | Wt 169.6 lb

## 2020-11-06 DIAGNOSIS — I480 Paroxysmal atrial fibrillation: Secondary | ICD-10-CM | POA: Diagnosis not present

## 2020-11-06 NOTE — Progress Notes (Signed)
11/06/2020 LEAH SKORA   1940-09-14  [id number]  Primary Physician Burnard Bunting, MD Primary Cardiologist: [name] MD FACP, [place], [place], [place]  HPI:  Lucas [name] is a 80 y.o.  thin and fit-appearing, married Caucasian male, father to 57 stepchild, grandfather to 2 step-grandchildren who I last saw in the office  06/13/2019. He had diagnostic cath performed by me in January after a positive Myoview that revealed normal coronary arteries, normal LV function suggesting a false positive test. He has PAF with RVR in the past with atypical chest pain. He also has CLL which is quiescent followed by Dr. [name]. He did have a 3-week photo shoot safari in [place] in the [place] after I cath'd him and has taken [employer]. He is otherwise active and asymptomatic. His most recent lab work performed in January revealed total cholesterol 168, LDL 107, HDL 44. He was admitted to [facility] on 06/27/13 by Dr. [name] for A. Fib. Plans were to convert him with oral flecainide however he converted on his own. He was seen in the [facility] by Dr. [name]. Since I saw him in the office one year ago he has had one episode of PAF in March of 2015while he was in [place]. His travel plans in the upcoming future included[place], [place], [place] in [place] in May followed by [place] in September.he was recently in [place] and had an episode of PAF on July 23 well on a train and a dining car. His most recent episode was on 04/13/17 after being woken up from a nap with rapid heart rate. This took 5 hours to spontaneously convert.   His travels continued to  [place] and [place] as well as [place] and [place] in August.  2019.  Unfortunately, because of COVID-19 his traveling has been put on hold although he is still active.  He continues to play golf and pickleball.  He had arthroscopic surgery on his right knee in June for torn meniscus.   Since I saw him a  year and a half ago he is done well.  He has not traveled because of Covid but plans on going to [place] countries this coming June.  He still very active.  He had an episode of PAF on 10/29/2020 lasting approximately 9 hours prior to that he had an episode in September all of which were self-limited and resolve spontaneously.  He does complain of some dizziness during these episodes.  He denies chest pain or shortness of breath.  Current Meds  Medication Sig  . brimonidine (ALPHAGAN) 0.2 % ophthalmic solution Place 1 drop into the left eye 2 (two) times daily.   . chlorpheniramine (CHLOR-TRIMETON) 4 MG tablet Take 4 mg by mouth daily as needed for allergies.  Marland Kitchen ELIQUIS 5 MG TABS tablet TAKE 1 TABLET TWICE A DAY  . ketoconazole (NIZORAL) 2 % cream   . meclizine (ANTIVERT) 25 MG tablet Take 25 mg by mouth 3 (three) times daily as needed.  . metFORMIN (GLUCOPHAGE) 500 MG tablet Take 500 mg by mouth daily.   . metoprolol succinate (TOPROL-XL) 25 MG 24 hr tablet TAKE 1 TABLET DAILY  . PEG-KCl-NaCl-NaSulf-Na Asc-C (PLENVU) 140 g SOLR Use as directed  . sildenafil (VIAGRA) 50 MG tablet Take 1 tablet (50 mg total) by mouth daily as needed for erectile dysfunction.  . timolol (TIMOPTIC) 0.5 % ophthalmic solution Place 1 drop into the left eye daily. Left eye     Allergies  Allergen Reactions  . Other Anaphylaxis    Shellfish  . Shellfish-Derived Products Anaphylaxis  . Adhesive [Tape] Other (See Comments)    REDNESS  . Nsaids   . Shellfish Allergy Swelling  . Latex Rash    Social History   Socioeconomic History  . Marital status: Married    Spouse name: Not on file  . Number of children: 1  . Years of education: Not on file  . Highest education level: Not on file  Occupational History    Employer: RETIRED  Tobacco Use  . Smoking status: Former Smoker    Packs/day: 1.00    Years: 3.00    Pack years: 3.00    Types: Cigarettes    Start date: 09/19/1962    Quit date: 12/17/1965     Years since quitting: 54.9  . Smokeless tobacco: Never Used  . Tobacco comment: quit 47 yeras ago  Vaping Use  . Vaping Use: Never used  Substance and Sexual Activity  . Alcohol use: Yes    Alcohol/week: 4.0 standard drinks    Types: 4 Glasses of wine per week  . Drug use: No  . Sexual activity: Yes  Other Topics Concern  . Not on file  Social History Narrative   Married Caucasian male, father to 37 stepchild, grandfather to 2 step-grandchildren.   Lives with his wife.   Travels a lot & enjoys photography.   Social Determinants of Health   Financial Resource Strain: Not on file  Food Insecurity: Not on file  Transportation Needs: Not on file  Physical Activity: Not on file  Stress: Not on file  Social Connections: Not on file  Intimate Partner Violence: Not on file     Review of Systems: General: negative for chills, fever, night sweats or weight changes.  Cardiovascular: negative for chest pain, dyspnea on exertion, edema, orthopnea, palpitations, paroxysmal nocturnal dyspnea or shortness of breath Dermatological: negative for rash Respiratory: negative for cough or wheezing Urologic: negative for hematuria Abdominal: negative for nausea, vomiting, diarrhea, bright red blood per rectum, melena, or hematemesis Neurologic: negative for visual changes, syncope, or dizziness All other systems reviewed and are otherwise negative except as noted above.    Blood pressure 130/78, pulse (!) 58, height 6' (1.829 m), weight 169 lb 9.6 oz (76.9 kg), SpO2 93 %.  General appearance: alert and no distress Neck: no adenopathy, no carotid bruit, no JVD, supple, symmetrical, trachea midline and thyroid not enlarged, symmetric, no tenderness/mass/nodules Lungs: clear to auscultation bilaterally Heart: regular rate and rhythm, S1, S2 normal, no murmur, click, rub or gallop Extremities: extremities normal, atraumatic, no cyanosis or edema Pulses: 2+ and symmetric Skin: Skin color, texture,  turgor normal. No rashes or lesions Neurologic: Alert and oriented X 3, normal strength and tone. Normal symmetric reflexes. Normal coordination and gait  EKG sinus bradycardia 58 without ST or T wave changes.  I personally reviewed this EKG.  ASSESSMENT AND PLAN:   Paroxysmal atrial fibrillation (HCC) History of PAF in the past on Eliquis oral anticoagulation and metoprolol.  He has rare episodes the most recent one being on 10/29/2020 lasting 9 hours and the prior 1 in September.  He does feel dizzy during these episodes and they resolve spontaneously.  At this point, I do not feel need to institute antiarrhythmic therapy or to recommend a more invasive approach.      [name] MD FACP,FACC,FAHA, [facility] 11/06/2020 10:01 AM

## 2020-11-06 NOTE — Patient Instructions (Addendum)

## 2020-11-06 NOTE — Assessment & Plan Note (Signed)
History of PAF in the past on Eliquis oral anticoagulation and metoprolol.  He has rare episodes the most recent one being on 10/29/2020 lasting 9 hours and the prior 1 in September.  He does feel dizzy during these episodes and they resolve spontaneously.  At this point, I do not feel need to institute antiarrhythmic therapy or to recommend a more invasive approach.

## 2020-12-04 ENCOUNTER — Other Ambulatory Visit (HOSPITAL_BASED_OUTPATIENT_CLINIC_OR_DEPARTMENT_OTHER): Payer: Self-pay

## 2020-12-05 ENCOUNTER — Other Ambulatory Visit: Payer: Self-pay

## 2020-12-05 ENCOUNTER — Other Ambulatory Visit (HOSPITAL_BASED_OUTPATIENT_CLINIC_OR_DEPARTMENT_OTHER): Payer: Self-pay

## 2020-12-05 ENCOUNTER — Ambulatory Visit: Payer: Medicare Other | Attending: Internal Medicine

## 2020-12-05 DIAGNOSIS — Z23 Encounter for immunization: Secondary | ICD-10-CM

## 2020-12-05 MED ORDER — COVID-19 MRNA VAC-TRIS(PFIZER) 30 MCG/0.3ML IM SUSP
INTRAMUSCULAR | 0 refills | Status: DC
  Filled 2020-12-05: qty 0.3, 1d supply, fill #0

## 2020-12-05 NOTE — Progress Notes (Signed)
   Covid-19 Vaccination Clinic  Name:  Lucas [name]    MRN: [id number] DOB: 10-23-1940  12/05/2020  [name] was observed post Covid-19 immunization for 15 minutes without incident. He was provided with Vaccine Information Sheet and instruction to access the V-Safe system.   Lucas [name] was instructed to call 911 with any severe reactions post vaccine: Marland Kitchen Difficulty breathing  . Swelling of face and throat  . A fast heartbeat  . A bad rash all over body  . Dizziness and weakness   Immunizations Administered    Name Date Dose VIS Date Route   PFIZER Comrnaty(Gray TOP) Covid-19 Vaccine 12/05/2020 12:04 PM 0.3 mL 07/25/2020 Intramuscular   Manufacturer: [place]   Lot: [id number]   NDC: [phone]

## 2020-12-11 DIAGNOSIS — G629 Polyneuropathy, unspecified: Secondary | ICD-10-CM | POA: Diagnosis not present

## 2020-12-11 DIAGNOSIS — I4891 Unspecified atrial fibrillation: Secondary | ICD-10-CM | POA: Diagnosis not present

## 2020-12-11 DIAGNOSIS — E1139 Type 2 diabetes mellitus with other diabetic ophthalmic complication: Secondary | ICD-10-CM | POA: Diagnosis not present

## 2020-12-11 DIAGNOSIS — Z7901 Long term (current) use of anticoagulants: Secondary | ICD-10-CM | POA: Diagnosis not present

## 2020-12-16 DIAGNOSIS — H40043 Steroid responder, bilateral: Secondary | ICD-10-CM | POA: Diagnosis not present

## 2020-12-25 DIAGNOSIS — L578 Other skin changes due to chronic exposure to nonionizing radiation: Secondary | ICD-10-CM | POA: Diagnosis not present

## 2020-12-25 DIAGNOSIS — L986 Other infiltrative disorders of the skin and subcutaneous tissue: Secondary | ICD-10-CM | POA: Diagnosis not present

## 2020-12-25 DIAGNOSIS — D485 Neoplasm of uncertain behavior of skin: Secondary | ICD-10-CM | POA: Diagnosis not present

## 2020-12-25 DIAGNOSIS — L570 Actinic keratosis: Secondary | ICD-10-CM | POA: Diagnosis not present

## 2020-12-25 DIAGNOSIS — L820 Inflamed seborrheic keratosis: Secondary | ICD-10-CM | POA: Diagnosis not present

## 2021-01-15 ENCOUNTER — Other Ambulatory Visit: Payer: Self-pay | Admitting: Cardiovascular Disease

## 2021-01-16 NOTE — Telephone Encounter (Signed)
62m, 76.9kg, scr 1.02 08/13/20, lovw/berry 11/06/20

## 2021-01-23 DIAGNOSIS — H5203 Hypermetropia, bilateral: Secondary | ICD-10-CM | POA: Diagnosis not present

## 2021-01-23 DIAGNOSIS — H52223 Regular astigmatism, bilateral: Secondary | ICD-10-CM | POA: Diagnosis not present

## 2021-02-11 ENCOUNTER — Other Ambulatory Visit: Payer: Medicare Other

## 2021-02-11 ENCOUNTER — Ambulatory Visit: Payer: Medicare Other | Admitting: Hematology & Oncology

## 2021-03-11 ENCOUNTER — Other Ambulatory Visit: Payer: Self-pay | Admitting: Cardiovascular Disease

## 2021-03-13 ENCOUNTER — Inpatient Hospital Stay: Payer: Medicare Other | Attending: Hematology & Oncology

## 2021-03-13 ENCOUNTER — Inpatient Hospital Stay (HOSPITAL_BASED_OUTPATIENT_CLINIC_OR_DEPARTMENT_OTHER): Payer: Medicare Other | Admitting: Hematology & Oncology

## 2021-03-13 ENCOUNTER — Encounter: Payer: Self-pay | Admitting: Hematology & Oncology

## 2021-03-13 ENCOUNTER — Telehealth: Payer: Self-pay

## 2021-03-13 ENCOUNTER — Other Ambulatory Visit: Payer: Self-pay

## 2021-03-13 VITALS — BP 115/67 | HR 62 | Temp 97.9°F | Resp 18 | Wt 162.0 lb

## 2021-03-13 DIAGNOSIS — Z7901 Long term (current) use of anticoagulants: Secondary | ICD-10-CM | POA: Diagnosis not present

## 2021-03-13 DIAGNOSIS — I4891 Unspecified atrial fibrillation: Secondary | ICD-10-CM | POA: Diagnosis not present

## 2021-03-13 DIAGNOSIS — C9110 Chronic lymphocytic leukemia of B-cell type not having achieved remission: Secondary | ICD-10-CM

## 2021-03-13 DIAGNOSIS — I480 Paroxysmal atrial fibrillation: Secondary | ICD-10-CM

## 2021-03-13 LAB — CMP (CANCER CENTER ONLY)
ALT: 11 U/L (ref 0–44)
AST: 13 U/L — ABNORMAL LOW (ref 15–41)
Albumin: 4.4 g/dL (ref 3.5–5.0)
Alkaline Phosphatase: 61 U/L (ref 38–126)
Anion gap: 5 (ref 5–15)
BUN: 19 mg/dL (ref 8–23)
CO2: 34 mmol/L — ABNORMAL HIGH (ref 22–32)
Calcium: 9.9 mg/dL (ref 8.9–10.3)
Chloride: 100 mmol/L (ref 98–111)
Creatinine: 1.02 mg/dL (ref 0.61–1.24)
GFR, Estimated: >60 mL/min (ref >60)
Glucose, Bld: 165 mg/dL — ABNORMAL HIGH (ref 70–99)
Potassium: 4.9 mmol/L (ref 3.5–5.1)
Sodium: 139 mmol/L (ref 135–145)
Total Bilirubin: 1 mg/dL (ref 0.3–1.2)
Total Protein: 6.9 g/dL (ref 6.5–8.1)

## 2021-03-13 LAB — CBC WITH DIFFERENTIAL (CANCER CENTER ONLY)
Abs Immature Granulocytes: 0.04 10*3/uL (ref 0.00–0.07)
Basophils Absolute: 0 10*3/uL (ref 0.0–0.1)
Basophils Relative: 0 %
Eosinophils Absolute: 0 10*3/uL (ref 0.0–0.5)
Eosinophils Relative: 0 %
HCT: 46.9 % (ref 39.0–52.0)
Hemoglobin: 15.1 g/dL (ref 13.0–17.0)
Immature Granulocytes: 0 %
Lymphocytes Relative: 82 %
Lymphs Abs: 12.9 10*3/uL — ABNORMAL HIGH (ref 0.7–4.0)
MCH: 30.6 pg (ref 26.0–34.0)
MCHC: 32.2 g/dL (ref 30.0–36.0)
MCV: 95.1 fL (ref 80.0–100.0)
Monocytes Absolute: 0.6 10*3/uL (ref 0.1–1.0)
Monocytes Relative: 3 %
Neutro Abs: 2.4 10*3/uL (ref 1.7–7.7)
Neutrophils Relative %: 15 %
Platelet Count: 81 10*3/uL — ABNORMAL LOW (ref 150–400)
RBC: 4.93 MIL/uL (ref 4.22–5.81)
RDW: 13.5 % (ref 11.5–15.5)
Smear Review: NORMAL
WBC Count: 16 10*3/uL — ABNORMAL HIGH (ref 4.0–10.5)
nRBC: 0 % (ref 0.0–0.2)

## 2021-03-13 LAB — LACTATE DEHYDROGENASE: LDH: 132 U/L (ref 98–192)

## 2021-03-13 NOTE — Progress Notes (Signed)
Hematology and Oncology Follow Up Visit  Lucas [name]:[id number] September 25, 1940 80 y.o. 03/13/2021   Principle Diagnosis:  1. Chronic lymphocytic leukemia - stage C  Current Therapy:   Observation    Interim History:  Lucas [name] is here today for a follow-up.  He is wife just got back from their big [place] cruise.  That a wonderful time up in the [place].  They enjoy themselves.  They had a lot of good food.  They took pictures and it was daylight after midnight.  He feels well.  His atrial fibrillation has not caused any problems.  He has had no issues with nausea or vomiting.  He has had no rashes.  He is on Eliquis for the atrial fibrillation.  He has had no bleeding.  I think his next trip will be up to [place].  This might might be in August.  He has had no issues with COVID.  Is no change in bowel or bladder habits.  He has had no leg swelling.  Overall, his performance status is ECOG 1.     Medications:  Allergies as of 03/13/2021       Reactions   Other Anaphylaxis   Shellfish   Shellfish-derived Products Anaphylaxis   Adhesive [tape] Other (See Comments)   REDNESS   Nsaids    Shellfish Allergy Swelling   Latex Rash        Medication List        Accurate as of March 13, 2021  8:55 AM. If you have any questions, ask your nurse or doctor.          STOP taking these medications    Plenvu 140 g Solr Generic drug: PEG-KCl-NaCl-NaSulf-Na Asc-C Stopped by: Volanda Napoleon, MD       TAKE these medications    brimonidine 0.2 % ophthalmic solution Commonly known as: ALPHAGAN Place 1 drop into the left eye 2 (two) times daily.   chlorpheniramine 4 MG tablet Commonly known as: CHLOR-TRIMETON Take 4 mg by mouth daily as needed for allergies.   Eliquis 5 MG Tabs tablet Generic drug: apixaban TAKE 1 TABLET TWICE A DAY   ketoconazole 2 % cream Commonly known as: NIZORAL   meclizine 25 MG tablet Commonly known as:  ANTIVERT Take 25 mg by mouth 3 (three) times daily as needed.   metFORMIN 500 MG tablet Commonly known as: GLUCOPHAGE Take 500 mg by mouth daily.   metoprolol succinate 25 MG 24 hr tablet Commonly known as: TOPROL-XL TAKE 1 TABLET DAILY   Pfizer-BioNT COVID-19 Vac-TriS Susp injection Generic drug: COVID-19 mRNA Vac-TriS (Pfizer) Inject into the muscle.   sildenafil 50 MG tablet Commonly known as: Viagra Take 1 tablet (50 mg total) by mouth daily as needed for erectile dysfunction.   timolol 0.5 % ophthalmic solution Commonly known as: TIMOPTIC Place 1 drop into the left eye daily. Left eye        Allergies:  Allergies  Allergen Reactions   Other Anaphylaxis    Shellfish   Shellfish-Derived Products Anaphylaxis   Adhesive [Tape] Other (See Comments)    REDNESS   Nsaids    Shellfish Allergy Swelling   Latex Rash    Past Medical History, Surgical history, Social history, and Family History were reviewed and updated.  Review of Systems: Review of Systems  Constitutional: Negative.   HENT: Negative.    Eyes: Negative.   Respiratory: Negative.    Cardiovascular: Negative.   Gastrointestinal: Negative.  Genitourinary: Negative.   Musculoskeletal: Negative.   Skin: Negative.   Neurological: Negative.   Endo/Heme/Allergies: Negative.   Psychiatric/Behavioral: Negative.      Physical Exam:  weight is 162 lb (73.5 kg). His oral temperature is 97.9 F (36.6 C). His blood pressure is 115/67 and his pulse is 62. His respiration is 18 and oxygen saturation is 100%.   Wt Readings from Last 3 Encounters:  03/13/21 162 lb (73.5 kg)  11/06/20 169 lb 9.6 oz (76.9 kg)  08/22/20 174 lb (78.9 kg)    Physical Exam Vitals reviewed.  HENT:     Head: Normocephalic and atraumatic.  Eyes:     Pupils: Pupils are equal, round, and reactive to light.  Cardiovascular:     Rate and Rhythm: Normal rate and regular rhythm.     Heart sounds: Normal heart sounds.  Pulmonary:      Effort: Pulmonary effort is normal.     Breath sounds: Normal breath sounds.  Abdominal:     General: Bowel sounds are normal.     Palpations: Abdomen is soft.  Musculoskeletal:        General: No tenderness or deformity. Normal range of motion.     Cervical back: Normal range of motion.  Lymphadenopathy:     Cervical: No cervical adenopathy.  Skin:    General: Skin is warm and dry.     Findings: No erythema or rash.  Neurological:     Mental Status: He is alert and oriented to person, place, and time.  Psychiatric:        Behavior: Behavior normal.        Thought Content: Thought content normal.        Judgment: Judgment normal.     Lab Results  Component Value Date   WBC 16.0 (H) 03/13/2021   HGB 15.1 03/13/2021   HCT 46.9 03/13/2021   MCV 95.1 03/13/2021   PLT 81 (L) 03/13/2021   No results found for: FERRITIN, IRON, TIBC, UIBC, IRONPCTSAT Lab Results  Component Value Date   RETICCTPCT 1.0 06/15/2013   RBC 4.93 03/13/2021   RETICCTABS 47.6 06/15/2013   Lab Results  Component Value Date   KPAFRELGTCHN 22.0 (H) 01/19/2020   LAMBDASER 10.7 01/19/2020   KAPLAMBRATIO 2.06 (H) 01/19/2020   Lab Results  Component Value Date   IGGSERUM 978 08/13/2020   IGA 108 08/13/2020   IGMSERUM 122 08/13/2020   Lab Results  Component Value Date   TOTALPROTELP 6.5 06/15/2013   ALBUMINELP 61.7 06/15/2013   A1GS 3.5 06/15/2013   A2GS 9.8 06/15/2013   BETS 5.3 06/15/2013   BETA2SER 8.7 (H) 06/15/2013   GAMS 11.0 (L) 06/15/2013   MSPIKE Not Observed 01/25/2017   SPEI * 06/15/2013     Chemistry      Component Value Date/Time   NA 139 03/13/2021 0756   NA 142 06/28/2017 0855   K 4.9 03/13/2021 0756   K 4.5 06/28/2017 0855   CL 100 03/13/2021 0756   CL 99 06/28/2017 0855   CO2 34 (H) 03/13/2021 0756   CO2 32 06/28/2017 0855   BUN 19 03/13/2021 0756   BUN 15 06/28/2017 0855   CREATININE 1.02 03/13/2021 0756   CREATININE 1.0 06/28/2017 0855      Component Value  Date/Time   CALCIUM 9.9 03/13/2021 0756   CALCIUM 9.2 06/28/2017 0855   ALKPHOS 61 03/13/2021 0756   ALKPHOS 74 06/28/2017 0855   AST 13 (L) 03/13/2021 0756   ALT 11 03/13/2021  0756   ALT 13 06/28/2017 0855   BILITOT 1.0 03/13/2021 0756     Impression and Plan: Lucas [name] is very pleasant 80 year old male with CLL. We have not had to treat him now for over 20 years.    Everything looks quite stable from my point of view.  His platelet count is holding low but stable.  There is nothing on his physical exam that would suggest any kind of disease progression.  We will plan to get him back in 6 months.  I am so glad that he now is able to travel again.  He and his wife looked at the travel and could not because of COVID.  There "back in the saddle" and well on their way to foreign land.   Volanda Napoleon, MD 7/28/20228:55 AM

## 2021-03-13 NOTE — Telephone Encounter (Signed)
Appts made and printed for pt per 03/13/21 los  Lucas [name]

## 2021-03-16 DIAGNOSIS — I4891 Unspecified atrial fibrillation: Secondary | ICD-10-CM | POA: Diagnosis not present

## 2021-03-16 DIAGNOSIS — H8113 Benign paroxysmal vertigo, bilateral: Secondary | ICD-10-CM | POA: Diagnosis not present

## 2021-03-16 DIAGNOSIS — E785 Hyperlipidemia, unspecified: Secondary | ICD-10-CM | POA: Diagnosis not present

## 2021-03-16 DIAGNOSIS — E1149 Type 2 diabetes mellitus with other diabetic neurological complication: Secondary | ICD-10-CM | POA: Diagnosis not present

## 2021-03-21 DIAGNOSIS — L910 Hypertrophic scar: Secondary | ICD-10-CM | POA: Diagnosis not present

## 2021-03-21 DIAGNOSIS — C4441 Basal cell carcinoma of skin of scalp and neck: Secondary | ICD-10-CM | POA: Diagnosis not present

## 2021-03-21 DIAGNOSIS — L570 Actinic keratosis: Secondary | ICD-10-CM | POA: Diagnosis not present

## 2021-03-21 DIAGNOSIS — D485 Neoplasm of uncertain behavior of skin: Secondary | ICD-10-CM | POA: Diagnosis not present

## 2021-03-21 DIAGNOSIS — D2261 Melanocytic nevi of right upper limb, including shoulder: Secondary | ICD-10-CM | POA: Diagnosis not present

## 2021-03-21 DIAGNOSIS — D225 Melanocytic nevi of trunk: Secondary | ICD-10-CM | POA: Diagnosis not present

## 2021-03-21 DIAGNOSIS — L820 Inflamed seborrheic keratosis: Secondary | ICD-10-CM | POA: Diagnosis not present

## 2021-03-21 DIAGNOSIS — B353 Tinea pedis: Secondary | ICD-10-CM | POA: Diagnosis not present

## 2021-03-21 DIAGNOSIS — Z85828 Personal history of other malignant neoplasm of skin: Secondary | ICD-10-CM | POA: Diagnosis not present

## 2021-03-21 DIAGNOSIS — L578 Other skin changes due to chronic exposure to nonionizing radiation: Secondary | ICD-10-CM | POA: Diagnosis not present

## 2021-03-21 DIAGNOSIS — L821 Other seborrheic keratosis: Secondary | ICD-10-CM | POA: Diagnosis not present

## 2021-04-16 DIAGNOSIS — C4441 Basal cell carcinoma of skin of scalp and neck: Secondary | ICD-10-CM | POA: Diagnosis not present

## 2021-05-19 DIAGNOSIS — Z23 Encounter for immunization: Secondary | ICD-10-CM | POA: Diagnosis not present

## 2021-05-26 DIAGNOSIS — Z23 Encounter for immunization: Secondary | ICD-10-CM | POA: Diagnosis not present

## 2021-06-05 DIAGNOSIS — E1149 Type 2 diabetes mellitus with other diabetic neurological complication: Secondary | ICD-10-CM | POA: Diagnosis not present

## 2021-06-05 DIAGNOSIS — Z125 Encounter for screening for malignant neoplasm of prostate: Secondary | ICD-10-CM | POA: Diagnosis not present

## 2021-06-05 DIAGNOSIS — E785 Hyperlipidemia, unspecified: Secondary | ICD-10-CM | POA: Diagnosis not present

## 2021-06-11 DIAGNOSIS — I4891 Unspecified atrial fibrillation: Secondary | ICD-10-CM | POA: Diagnosis not present

## 2021-06-11 DIAGNOSIS — E1139 Type 2 diabetes mellitus with other diabetic ophthalmic complication: Secondary | ICD-10-CM | POA: Diagnosis not present

## 2021-06-11 DIAGNOSIS — Z0000 Encounter for general adult medical examination without abnormal findings: Secondary | ICD-10-CM | POA: Diagnosis not present

## 2021-06-11 DIAGNOSIS — H409 Unspecified glaucoma: Secondary | ICD-10-CM | POA: Diagnosis not present

## 2021-06-11 DIAGNOSIS — E1149 Type 2 diabetes mellitus with other diabetic neurological complication: Secondary | ICD-10-CM | POA: Diagnosis not present

## 2021-06-11 DIAGNOSIS — Z1331 Encounter for screening for depression: Secondary | ICD-10-CM | POA: Diagnosis not present

## 2021-06-11 DIAGNOSIS — Z1339 Encounter for screening examination for other mental health and behavioral disorders: Secondary | ICD-10-CM | POA: Diagnosis not present

## 2021-06-11 DIAGNOSIS — G629 Polyneuropathy, unspecified: Secondary | ICD-10-CM | POA: Diagnosis not present

## 2021-06-11 DIAGNOSIS — N401 Enlarged prostate with lower urinary tract symptoms: Secondary | ICD-10-CM | POA: Diagnosis not present

## 2021-06-11 DIAGNOSIS — R82998 Other abnormal findings in urine: Secondary | ICD-10-CM | POA: Diagnosis not present

## 2021-06-16 DIAGNOSIS — E119 Type 2 diabetes mellitus without complications: Secondary | ICD-10-CM | POA: Diagnosis not present

## 2021-06-16 DIAGNOSIS — I4891 Unspecified atrial fibrillation: Secondary | ICD-10-CM | POA: Diagnosis not present

## 2021-06-16 DIAGNOSIS — E785 Hyperlipidemia, unspecified: Secondary | ICD-10-CM | POA: Diagnosis not present

## 2021-07-04 DIAGNOSIS — D0472 Carcinoma in situ of skin of left lower limb, including hip: Secondary | ICD-10-CM | POA: Diagnosis not present

## 2021-07-04 DIAGNOSIS — L570 Actinic keratosis: Secondary | ICD-10-CM | POA: Diagnosis not present

## 2021-07-04 DIAGNOSIS — L578 Other skin changes due to chronic exposure to nonionizing radiation: Secondary | ICD-10-CM | POA: Diagnosis not present

## 2021-07-04 DIAGNOSIS — D485 Neoplasm of uncertain behavior of skin: Secondary | ICD-10-CM | POA: Diagnosis not present

## 2021-07-04 DIAGNOSIS — Z23 Encounter for immunization: Secondary | ICD-10-CM | POA: Diagnosis not present

## 2021-07-18 ENCOUNTER — Other Ambulatory Visit: Payer: Self-pay | Admitting: Cardiovascular Disease

## 2021-07-18 NOTE — Telephone Encounter (Signed)
Prescription refill request for Eliquis received. Indication:Afib Last office visit:3/22 Scr:1.0 Age: 80 Weight:73.5 kg  Prescription refilled

## 2021-09-01 ENCOUNTER — Encounter: Payer: Self-pay | Admitting: Cardiovascular Disease

## 2021-09-01 MED ORDER — METOPROLOL SUCCINATE ER 25 MG PO TB24: 25.0000 mg | ORAL_TABLET | Freq: Every day | ORAL | 3 refills | Status: DC

## 2021-09-03 ENCOUNTER — Other Ambulatory Visit: Payer: Self-pay

## 2021-09-03 MED ORDER — METOPROLOL SUCCINATE ER 25 MG PO TB24: 25.0000 mg | ORAL_TABLET | Freq: Every day | ORAL | 0 refills | Status: DC

## 2021-09-12 ENCOUNTER — Ambulatory Visit: Payer: Medicare Other | Admitting: Hematology & Oncology

## 2021-09-12 ENCOUNTER — Other Ambulatory Visit: Payer: Medicare Other

## 2021-09-17 ENCOUNTER — Other Ambulatory Visit: Payer: Self-pay

## 2021-09-17 ENCOUNTER — Encounter: Payer: Self-pay | Admitting: Hematology & Oncology

## 2021-09-17 ENCOUNTER — Inpatient Hospital Stay (HOSPITAL_BASED_OUTPATIENT_CLINIC_OR_DEPARTMENT_OTHER): Payer: Medicare Other | Admitting: Hematology & Oncology

## 2021-09-17 ENCOUNTER — Inpatient Hospital Stay: Payer: Medicare Other | Attending: Hematology & Oncology

## 2021-09-17 VITALS — BP 104/68 | HR 61 | Temp 98.0°F | Resp 18 | Wt 166.0 lb

## 2021-09-17 DIAGNOSIS — Z7901 Long term (current) use of anticoagulants: Secondary | ICD-10-CM | POA: Diagnosis not present

## 2021-09-17 DIAGNOSIS — C9110 Chronic lymphocytic leukemia of B-cell type not having achieved remission: Secondary | ICD-10-CM | POA: Insufficient documentation

## 2021-09-17 DIAGNOSIS — Z7189 Other specified counseling: Secondary | ICD-10-CM | POA: Diagnosis not present

## 2021-09-17 DIAGNOSIS — E119 Type 2 diabetes mellitus without complications: Secondary | ICD-10-CM | POA: Insufficient documentation

## 2021-09-17 DIAGNOSIS — Z79899 Other long term (current) drug therapy: Secondary | ICD-10-CM | POA: Diagnosis not present

## 2021-09-17 HISTORY — DX: Other specified counseling: Z71.89

## 2021-09-17 LAB — CBC WITH DIFFERENTIAL (CANCER CENTER ONLY)
Abs Immature Granulocytes: 0.04 10*3/uL (ref 0.00–0.07)
Basophils Absolute: 0.1 10*3/uL (ref 0.0–0.1)
Basophils Relative: 0 %
Eosinophils Absolute: 0 10*3/uL (ref 0.0–0.5)
Eosinophils Relative: 0 %
HCT: 48.1 % (ref 39.0–52.0)
Hemoglobin: 15.5 g/dL (ref 13.0–17.0)
Immature Granulocytes: 0 %
Lymphocytes Relative: 83 %
Lymphs Abs: 14.8 10*3/uL — ABNORMAL HIGH (ref 0.7–4.0)
MCH: 30.3 pg (ref 26.0–34.0)
MCHC: 32.2 g/dL (ref 30.0–36.0)
MCV: 93.9 fL (ref 80.0–100.0)
Monocytes Absolute: 0.4 10*3/uL (ref 0.1–1.0)
Monocytes Relative: 2 %
Neutro Abs: 2.7 10*3/uL (ref 1.7–7.7)
Neutrophils Relative %: 15 %
Platelet Count: 99 10*3/uL — ABNORMAL LOW (ref 150–400)
RBC: 5.12 MIL/uL (ref 4.22–5.81)
RDW: 13.9 % (ref 11.5–15.5)
Smear Review: NORMAL
WBC Count: 18 10*3/uL — ABNORMAL HIGH (ref 4.0–10.5)
nRBC: 0 % (ref 0.0–0.2)

## 2021-09-17 LAB — CMP (CANCER CENTER ONLY)
ALT: 18 U/L (ref 0–44)
AST: 15 U/L (ref 15–41)
Albumin: 4.7 g/dL (ref 3.5–5.0)
Alkaline Phosphatase: 61 U/L (ref 38–126)
Anion gap: 7 (ref 5–15)
BUN: 19 mg/dL (ref 8–23)
CO2: 32 mmol/L (ref 22–32)
Calcium: 10.1 mg/dL (ref 8.9–10.3)
Chloride: 99 mmol/L (ref 98–111)
Creatinine: 1.01 mg/dL (ref 0.61–1.24)
GFR, Estimated: >60 mL/min (ref >60)
Glucose, Bld: 143 mg/dL — ABNORMAL HIGH (ref 70–99)
Potassium: 4.7 mmol/L (ref 3.5–5.1)
Sodium: 138 mmol/L (ref 135–145)
Total Bilirubin: 1.1 mg/dL (ref 0.3–1.2)
Total Protein: 7.1 g/dL (ref 6.5–8.1)

## 2021-09-17 LAB — SAVE SMEAR(SSMR), FOR PROVIDER SLIDE REVIEW

## 2021-09-17 LAB — LACTATE DEHYDROGENASE: LDH: 111 U/L (ref 98–192)

## 2021-09-17 NOTE — Progress Notes (Signed)
Hematology and Oncology Follow Up Visit  Lucas [name] [id number] 1940-10-24 81 y.o. 09/17/2021   Principle Diagnosis:  1. Chronic lymphocytic leukemia - stage C  Current Therapy:   Observation    Interim History:  Lucas [name] is here today for a follow-up.  As always, he and his wife have been traveling.  In early November, they went down to [place].  That a wonderful time down there.  They went to see a lot of wildlife.  They really had an enjoyable time.  In April, they will be going to [place] and [place].  He is feeling well.  He is having no problems with the atrial fibrillation.  He is on Eliquis.  He has had no bleeding from the Eliquis.  He has had no problems with COVID.  He has had no issues with other infections.  He had a wonderful Christmas and New Year's.  He is still quite active.  He is had no swollen lymph nodes.  He has had no cough or shortness of breath.  He has had no nausea or vomiting.  He has had no leg swelling.  He has had no rashes.  He does have diabetes.  His metformin was increased.  He says his blood sugars are much better now.  Overall, his performance status is ECOG 0.       Medications:  Allergies as of 09/17/2021       Reactions   Other Anaphylaxis   Shellfish   Shellfish-derived Products Anaphylaxis   Adhesive [tape] Other (See Comments)   REDNESS   Nsaids    Shellfish Allergy Swelling   Latex Rash        Medication List        Accurate as of September 17, 2021  9:02 AM. If you have any questions, ask your nurse or doctor.          brimonidine 0.2 % ophthalmic solution Commonly known as: ALPHAGAN Place 1 drop into the left eye 2 (two) times daily.   chlorpheniramine 4 MG tablet Commonly known as: CHLOR-TRIMETON Take 4 mg by mouth daily as needed for allergies.   Eliquis 5 MG Tabs tablet Generic drug: apixaban TAKE 1 TABLET TWICE A DAY   ketoconazole 2 % cream Commonly known as: NIZORAL   meclizine 25 MG  tablet Commonly known as: ANTIVERT Take 25 mg by mouth 3 (three) times daily as needed.   metFORMIN 500 MG tablet Commonly known as: GLUCOPHAGE Take 500 mg by mouth daily.   metoprolol succinate 25 MG 24 hr tablet Commonly known as: TOPROL-XL Take 1 tablet (25 mg total) by mouth daily.   Pfizer-BioNT COVID-19 Vac-TriS Susp injection Generic drug: COVID-19 mRNA Vac-TriS (Pfizer) Inject into the muscle.   sildenafil 50 MG tablet Commonly known as: Viagra Take 1 tablet (50 mg total) by mouth daily as needed for erectile dysfunction.   timolol 0.5 % ophthalmic solution Commonly known as: TIMOPTIC Place 1 drop into the left eye daily. Left eye        Allergies:  Allergies  Allergen Reactions   Other Anaphylaxis    Shellfish   Shellfish-Derived Products Anaphylaxis   Adhesive [Tape] Other (See Comments)    REDNESS   Nsaids    Shellfish Allergy Swelling   Latex Rash    Past Medical History, Surgical history, Social history, and Family History were reviewed and updated.  Review of Systems: Review of Systems  Constitutional: Negative.   HENT: Negative.    Eyes:  Negative.   Respiratory: Negative.    Cardiovascular: Negative.   Gastrointestinal: Negative.   Genitourinary: Negative.   Musculoskeletal: Negative.   Skin: Negative.   Neurological: Negative.   Endo/Heme/Allergies: Negative.   Psychiatric/Behavioral: Negative.      Physical Exam:  weight is 166 lb (75.3 kg). His oral temperature is 98 F (36.7 C). His blood pressure is 104/68 and his pulse is 61. His respiration is 18 and oxygen saturation is 97%.   Wt Readings from Last 3 Encounters:  09/17/21 166 lb (75.3 kg)  03/13/21 162 lb (73.5 kg)  11/06/20 169 lb 9.6 oz (76.9 kg)    Physical Exam Vitals reviewed.  HENT:     Head: Normocephalic and atraumatic.  Eyes:     Pupils: Pupils are equal, round, and reactive to light.  Cardiovascular:     Rate and Rhythm: Normal rate and regular rhythm.      Heart sounds: Normal heart sounds.  Pulmonary:     Effort: Pulmonary effort is normal.     Breath sounds: Normal breath sounds.  Abdominal:     General: Bowel sounds are normal.     Palpations: Abdomen is soft.  Musculoskeletal:        General: No tenderness or deformity. Normal range of motion.     Cervical back: Normal range of motion.  Lymphadenopathy:     Cervical: No cervical adenopathy.  Skin:    General: Skin is warm and dry.     Findings: No erythema or rash.  Neurological:     Mental Status: He is alert and oriented to person, place, and time.  Psychiatric:        Behavior: Behavior normal.        Thought Content: Thought content normal.        Judgment: Judgment normal.     Lab Results  Component Value Date   WBC 18.0 (H) 09/17/2021   HGB 15.5 09/17/2021   HCT 48.1 09/17/2021   MCV 93.9 09/17/2021   PLT 99 (L) 09/17/2021   No results found for: FERRITIN, IRON, TIBC, UIBC, IRONPCTSAT Lab Results  Component Value Date   RETICCTPCT 1.0 06/15/2013   RBC 5.12 09/17/2021   RETICCTABS 47.6 06/15/2013   Lab Results  Component Value Date   KPAFRELGTCHN 22.0 (H) 01/19/2020   LAMBDASER 10.7 01/19/2020   KAPLAMBRATIO 2.06 (H) 01/19/2020   Lab Results  Component Value Date   IGGSERUM 978 08/13/2020   IGA 108 08/13/2020   IGMSERUM 122 08/13/2020   Lab Results  Component Value Date   TOTALPROTELP 6.5 06/15/2013   ALBUMINELP 61.7 06/15/2013   A1GS 3.5 06/15/2013   A2GS 9.8 06/15/2013   BETS 5.3 06/15/2013   BETA2SER 8.7 (H) 06/15/2013   GAMS 11.0 (L) 06/15/2013   MSPIKE Not Observed 01/25/2017   SPEI * 06/15/2013     Chemistry      Component Value Date/Time   NA 139 03/13/2021 0756   NA 142 06/28/2017 0855   K 4.9 03/13/2021 0756   K 4.5 06/28/2017 0855   CL 100 03/13/2021 0756   CL 99 06/28/2017 0855   CO2 34 (H) 03/13/2021 0756   CO2 32 06/28/2017 0855   BUN 19 03/13/2021 0756   BUN 15 06/28/2017 0855   CREATININE 1.02 03/13/2021 0756    CREATININE 1.0 06/28/2017 0855      Component Value Date/Time   CALCIUM 9.9 03/13/2021 0756   CALCIUM 9.2 06/28/2017 0855   ALKPHOS 61 03/13/2021 0756  ALKPHOS 74 06/28/2017 0855   AST 13 (L) 03/13/2021 0756   ALT 11 03/13/2021 0756   ALT 13 06/28/2017 0855   BILITOT 1.0 03/13/2021 0756     Impression and Plan: Lucas [name] is very pleasant 81 year old male with CLL. We have not had to treat him now for over 20 years.    Everything looks quite stable from my point of view.  His platelet count is holding low but stable.  There is nothing on his physical exam that would suggest any kind of disease progression.  We will plan to get him back in 6 months.  I am so glad that he now is able to travel again.  I am sure he and his wife have a wonderful time in [place] and [place].      Volanda Napoleon, MD 2/1/20239:02 AM

## 2021-09-18 LAB — IGG, IGA, IGM
IgA: 110 mg/dL (ref 61–437)
IgG (Immunoglobin G), Serum: 1006 mg/dL (ref 603–1613)
IgM (Immunoglobulin M), Srm: 136 mg/dL (ref 15–143)

## 2021-10-21 ENCOUNTER — Encounter: Payer: Self-pay | Admitting: Cardiovascular Disease

## 2021-10-22 ENCOUNTER — Other Ambulatory Visit: Payer: Self-pay

## 2021-10-22 MED ORDER — APIXABAN 5 MG PO TABS: 5.0000 mg | ORAL_TABLET | Freq: Two times a day (BID) | ORAL | 0 refills | Status: DC

## 2021-10-22 NOTE — Telephone Encounter (Signed)
Prescription refill request for Lucas [name] received. ?Indication:Afib ?Last office visit:3/22 ?Scr:1.0 ?Age: 81 ?Weight:75.3 kg ? ?Prescription refilled ? ?

## 2021-11-11 ENCOUNTER — Other Ambulatory Visit: Payer: Self-pay | Admitting: Cardiovascular Disease

## 2021-12-05 ENCOUNTER — Encounter: Payer: Self-pay | Admitting: Cardiovascular Disease

## 2021-12-05 ENCOUNTER — Ambulatory Visit: Payer: Medicare Other | Admitting: Cardiovascular Disease

## 2021-12-05 VITALS — BP 128/70 | HR 63 | Ht 72.0 in | Wt 169.0 lb

## 2021-12-05 DIAGNOSIS — I480 Paroxysmal atrial fibrillation: Secondary | ICD-10-CM | POA: Diagnosis not present

## 2021-12-05 NOTE — Patient Instructions (Signed)

## 2021-12-05 NOTE — Progress Notes (Signed)
? ? ? ?12/05/2021 ?[place]   ?Oct 07, 1940  ?[id number] ? ?Primary Physician Burnard Bunting, MD ?Primary Cardiologist: [name] MD Lupe Carney, [place] ? ?HPI:  Lucas [name] is a 81 y.o.  thin and fit-appearing, married Caucasian male, father to 44 stepchild, grandfather to 2 step-grandchildren who I last saw in the office    11/06/2020. He had diagnostic cath performed by me in January after a positive Myoview that revealed normal coronary arteries, normal LV function suggesting a false positive test. He has PAF with RVR in the past with atypical chest pain. He also has CLL which is quiescent followed by Dr. [name]. He did have a 3-week photo shoot safari in [place] in the [place] after I cath'd him and has taken [employer]. He is otherwise active and asymptomatic. His most recent lab work performed in January revealed total cholesterol 168, LDL 107, HDL 44. He was admitted to [facility] ER on 06/27/13 by Dr. [name] for A. Fib. Plans were to convert him with oral flecainide however he converted on his own. He was seen in the [facility] ER by Dr. [name]. Since I saw him in the office one year ago he has had one episode of PAF in March of 2015 while he was in [place]. His travel plans in the upcoming future included [place], [place], [place] in [place] in May followed by [place] in September.  he was recently in [place] and had an episode of PAF on July 23 well on a train and a dining car. His most recent episode was on 04/13/17 after being woken up from a nap with rapid heart rate. This took 5 hours to spontaneously convert. ?  ? His travels continued to  [place] and [place] as well as [place] and [place] in August.  2019 . ?  ?Unfortunately, because of COVID-19 his traveling has been put on hold although he is still active.  He continues to play golf and pickleball.  He had arthroscopic surgery on his right knee in June for torn meniscus.  ?  ?Since I saw him a  year ago he has done well.  He did travel to [place] since I saw him last to [place] in [place] and is planning to leave this coming Friday to [place] and [place].  He is completely asymptomatic.  Did have 5 episodes of A-fib over the last year the last 1 being 08/14/2021 lasting for several hours, and resolving spontaneously.  He is on Eliquis oral anticoagulation. ? ? ?Current Meds  ?Medication Sig  ? apixaban (ELIQUIS) 5 MG TABS tablet Take 1 tablet (5 mg total) by mouth 2 (two) times daily.  ? brimonidine (ALPHAGAN) 0.2 % ophthalmic solution Place 1 drop into the left eye 2 (two) times daily.   ? chlorpheniramine (CHLOR-TRIMETON) 4 MG tablet Take 4 mg by mouth daily as needed for allergies.  ? COVID-19 mRNA Vac-TriS, Pfizer, SUSP injection Inject into the muscle.  ? ketoconazole (NIZORAL) 2 % cream   ? meclizine (ANTIVERT) 25 MG tablet Take 25 mg by mouth 3 (three) times daily as needed.  ? metFORMIN (GLUCOPHAGE) 500 MG tablet Take 500 mg by mouth daily.   ? metoprolol succinate (TOPROL-XL) 25 MG 24 hr tablet Take 1 tablet (25 mg total) by mouth daily. Please keep upcoming appt for future refills.  ? sildenafil (VIAGRA) 50 MG tablet Take 1 tablet (50 mg total) by mouth daily as needed for erectile dysfunction.  ? timolol (TIMOPTIC) 0.5 %  ophthalmic solution Place 1 drop into the left eye daily. Left eye  ?  ? ?Allergies  ?Allergen Reactions  ? Other Anaphylaxis  ?  Shellfish  ? Shellfish-Derived Products Anaphylaxis  ? Adhesive [Tape] Other (See Comments)  ?  REDNESS  ? Nsaids   ? Shellfish Allergy Swelling  ? Latex Rash  ? ? ?Social History  ? ?Socioeconomic History  ? Marital status: Married  ?  Spouse name: Not on file  ? Number of children: 1  ? Years of education: Not on file  ? Highest education level: Not on file  ?Occupational History  ?  Employer: RETIRED  ?Tobacco Use  ? Smoking status: Former  ?  Packs/day: 1.00  ?  Years: 3.00  ?  Pack years: 3.00  ?  Types: Cigarettes  ?  Start date:  09/19/1962  ?  Quit date: 12/17/1965  ?  Years since quitting: 56.0  ? Smokeless tobacco: Never  ? Tobacco comments:  ?  quit 47 yeras ago  ?Vaping Use  ? Vaping Use: Never used  ?Substance and Sexual Activity  ? Alcohol use: Yes  ?  Alcohol/week: 4.0 standard drinks  ?  Types: 4 Glasses of wine per week  ? Drug use: No  ? Sexual activity: Yes  ?Other Topics Concern  ? Not on file  ?Social History Narrative  ? Married Caucasian male, father to 35 stepchild, grandfather to 2 step-grandchildren.  ? Lives with his wife.  ? [employer] a lot & enjoys photography.  ? ?Social Determinants of Health  ? ?Financial Resource Strain: Not on file  ?Food Insecurity: Not on file  ?Transportation Needs: Not on file  ?Physical Activity: Not on file  ?Stress: Not on file  ?Social Connections: Not on file  ?Intimate Partner Violence: Not on file  ?  ? ?Review of Systems: ?General: negative for chills, fever, night sweats or weight changes.  ?Cardiovascular: negative for chest pain, dyspnea on exertion, edema, orthopnea, palpitations, paroxysmal nocturnal dyspnea or shortness of breath ?Dermatological: negative for rash ?Respiratory: negative for cough or wheezing ?Urologic: negative for hematuria ?Abdominal: negative for nausea, vomiting, diarrhea, bright red blood per rectum, melena, or hematemesis ?Neurologic: negative for visual changes, syncope, or dizziness ?All other systems reviewed and are otherwise negative except as noted above. ? ? ? ?Blood pressure 128/70, pulse 63, height 6' (1.829 m), weight 169 lb (76.7 kg), SpO2 96 %.  ?General appearance: alert and no distress ?Neck: no adenopathy, no carotid bruit, no JVD, supple, symmetrical, trachea midline, and thyroid not enlarged, symmetric, no tenderness/mass/nodules ?Lungs: clear to auscultation bilaterally ?Heart: regular rate and rhythm, S1, S2 normal, no murmur, click, rub or gallop ?Extremities: extremities normal, atraumatic, no cyanosis or edema ?Pulses: 2+ and symmetric ?Skin:  Skin color, texture, turgor normal. No rashes or lesions ?Neurologic: Grossly normal ? ?EKG sinus rhythm at 63 without ST or T wave changes.  There was low limb voltage.  I personally reviewed this EKG. ? ?ASSESSMENT AND PLAN:  ? ?Paroxysmal atrial fibrillation (HCC) ?History of PAF maintaining sinus rhythm on Eliquis oral anticoagulation.  He has had 5 episodes of PAF lasting for several hours at a time since I saw him a year ago the last 1 was on 08/14/2021. ? ? ? ? ?[name] MD FACP,FACC,FAHA, FSCAI ?12/05/2021 ?9:19 AM ?

## 2021-12-05 NOTE — Assessment & Plan Note (Signed)
History of PAF maintaining sinus rhythm on Eliquis oral anticoagulation.  He has had 5 episodes of PAF lasting for several hours at a time since I saw him a year ago the last 1 was on 08/14/2021. ?

## 2021-12-28 ENCOUNTER — Other Ambulatory Visit: Payer: Self-pay | Admitting: Cardiovascular Disease

## 2021-12-29 NOTE — Telephone Encounter (Signed)
Prescription refill request for Eliquis received. ?Indication:Afib ?Last office visit:4/23 ?Scr:1.0 ?Age: 81 ?Weight:76.7 kg ? ?Prescription refilled ? ?

## 2022-01-31 ENCOUNTER — Other Ambulatory Visit: Payer: Self-pay | Admitting: Cardiovascular Disease

## 2022-04-24 ENCOUNTER — Other Ambulatory Visit: Payer: Self-pay

## 2022-04-24 ENCOUNTER — Encounter: Payer: Self-pay | Admitting: Hematology & Oncology

## 2022-04-24 ENCOUNTER — Inpatient Hospital Stay: Payer: Medicare Other | Attending: Hematology & Oncology

## 2022-04-24 ENCOUNTER — Inpatient Hospital Stay: Payer: Medicare Other | Admitting: Hematology & Oncology

## 2022-04-24 VITALS — BP 117/63 | HR 55 | Temp 97.5°F | Resp 18 | Ht 72.0 in | Wt 167.8 lb

## 2022-04-24 DIAGNOSIS — C9110 Chronic lymphocytic leukemia of B-cell type not having achieved remission: Secondary | ICD-10-CM | POA: Insufficient documentation

## 2022-04-24 LAB — CBC WITH DIFFERENTIAL (CANCER CENTER ONLY)
Abs Immature Granulocytes: 0.03 10*3/uL (ref 0.00–0.07)
Basophils Absolute: 0 10*3/uL (ref 0.0–0.1)
Basophils Relative: 0 %
Eosinophils Absolute: 0 10*3/uL (ref 0.0–0.5)
Eosinophils Relative: 0 %
HCT: 45.6 % (ref 39.0–52.0)
Hemoglobin: 14.6 g/dL (ref 13.0–17.0)
Immature Granulocytes: 0 %
Lymphocytes Relative: 77 %
Lymphs Abs: 11.4 10*3/uL — ABNORMAL HIGH (ref 0.7–4.0)
MCH: 30.1 pg (ref 26.0–34.0)
MCHC: 32 g/dL (ref 30.0–36.0)
MCV: 94 fL (ref 80.0–100.0)
Monocytes Absolute: 0.5 10*3/uL (ref 0.1–1.0)
Monocytes Relative: 4 %
Neutro Abs: 2.7 10*3/uL (ref 1.7–7.7)
Neutrophils Relative %: 19 %
Platelet Count: 91 10*3/uL — ABNORMAL LOW (ref 150–400)
RBC: 4.85 MIL/uL (ref 4.22–5.81)
RDW: 13.8 % (ref 11.5–15.5)
Smear Review: NORMAL
WBC Count: 14.7 10*3/uL — ABNORMAL HIGH (ref 4.0–10.5)
nRBC: 0 % (ref 0.0–0.2)

## 2022-04-24 LAB — CMP (CANCER CENTER ONLY)
ALT: 9 U/L (ref 0–44)
AST: 12 U/L — ABNORMAL LOW (ref 15–41)
Albumin: 4.4 g/dL (ref 3.5–5.0)
Alkaline Phosphatase: 63 U/L (ref 38–126)
Anion gap: 4 — ABNORMAL LOW (ref 5–15)
BUN: 12 mg/dL (ref 8–23)
CO2: 34 mmol/L — ABNORMAL HIGH (ref 22–32)
Calcium: 9.3 mg/dL (ref 8.9–10.3)
Chloride: 99 mmol/L (ref 98–111)
Creatinine: 0.96 mg/dL (ref 0.61–1.24)
GFR, Estimated: >60 mL/min (ref >60)
Glucose, Bld: 146 mg/dL — ABNORMAL HIGH (ref 70–99)
Potassium: 4.4 mmol/L (ref 3.5–5.1)
Sodium: 137 mmol/L (ref 135–145)
Total Bilirubin: 0.9 mg/dL (ref 0.3–1.2)
Total Protein: 6.8 g/dL (ref 6.5–8.1)

## 2022-04-24 LAB — LACTATE DEHYDROGENASE: LDH: 114 U/L (ref 98–192)

## 2022-04-24 LAB — SAVE SMEAR(SSMR), FOR PROVIDER SLIDE REVIEW

## 2022-04-24 NOTE — Progress Notes (Signed)
Hematology and Oncology Follow Up Visit  Lucas [name] [id number] June 24, 1941 81 y.o. 04/24/2022   Principle Diagnosis:  1. Chronic lymphocytic leukemia - stage C  Current Therapy:   Observation    Interim History:  Lucas [name] is here today for a follow-up.  It is no surprise that he and his wife just got back from [place].  They are hoping switch wound, [place] and [place].  As always, that a wonderful time.  Next year, after going over to [place].  He has been feeling well.  He has had no problems with the atrial fibrillation.  He does feel palpitations on occasion.  He is on Eliquis.  He has had no fever.  He has had no cough.  He has had no swollen lymph nodes.  There has been no change in bowel or bladder habits.  He has had no leg swelling.  Overall, I would say his performance status is probably ECOG 1.      Medications:  Allergies as of 04/24/2022       Reactions   Nsaids Other (See Comments)   DO NOT take with Eliquis   Other Anaphylaxis   Shellfish   Shellfish Allergy Anaphylaxis, Swelling   Shellfish-derived Products Anaphylaxis   Adhesive [tape] Other (See Comments)   REDNESS   Latex Rash        Medication List        Accurate as of April 24, 2022  8:51 AM. If you have any questions, ask your nurse or doctor.          STOP taking these medications    Pfizer-BioNT COVID-19 Vac-TriS Susp injection Generic drug: COVID-19 mRNA Vac-TriS Therapist, music) Stopped by: Volanda Napoleon, MD       TAKE these medications    brimonidine 0.2 % ophthalmic solution Commonly known as: ALPHAGAN Place 1 drop into the left eye 2 (two) times daily.   chlorpheniramine 4 MG tablet Commonly known as: CHLOR-TRIMETON Take 4 mg by mouth daily as needed for allergies.   Eliquis 5 MG Tabs tablet Generic drug: apixaban TAKE 1 TABLET BY MOUTH TWICE  DAILY   ketoconazole 2 % cream Commonly known as: NIZORAL   meclizine 25 MG tablet Commonly known as: ANTIVERT Take 25  mg by mouth 3 (three) times daily as needed.   metFORMIN 500 MG tablet Commonly known as: GLUCOPHAGE Take 500 mg by mouth daily.   metoprolol succinate 25 MG 24 hr tablet Commonly known as: TOPROL-XL TAKE 1 TABLET BY MOUTH DAILY   sildenafil 50 MG tablet Commonly known as: Viagra Take 1 tablet (50 mg total) by mouth daily as needed for erectile dysfunction.   timolol 0.5 % ophthalmic solution Commonly known as: TIMOPTIC Place 1 drop into the left eye daily. Left eye        Allergies:  Allergies  Allergen Reactions   Nsaids Other (See Comments)    DO NOT take with Eliquis   Other Anaphylaxis    Shellfish   Shellfish Allergy Anaphylaxis and Swelling   Shellfish-Derived Products Anaphylaxis   Adhesive [Tape] Other (See Comments)    REDNESS   Latex Rash    Past Medical History, Surgical history, Social history, and Family History were reviewed and updated.  Review of Systems: Review of Systems  Constitutional: Negative.   HENT: Negative.    Eyes: Negative.   Respiratory: Negative.    Cardiovascular: Negative.   Gastrointestinal: Negative.   Genitourinary: Negative.   Musculoskeletal: Negative.   Skin: Negative.  Neurological: Negative.   Endo/Heme/Allergies: Negative.   Psychiatric/Behavioral: Negative.       Physical Exam:  height is 6' (1.829 m) and weight is 167 lb 12.8 oz (76.1 kg). His oral temperature is 97.5 F (36.4 C) (abnormal). His blood pressure is 117/63 and his pulse is 55 (abnormal). His respiration is 18 and oxygen saturation is 99%.   Wt Readings from Last 3 Encounters:  04/24/22 167 lb 12.8 oz (76.1 kg)  12/05/21 169 lb (76.7 kg)  09/17/21 166 lb (75.3 kg)    Physical Exam Vitals reviewed.  HENT:     Head: Normocephalic and atraumatic.  Eyes:     Pupils: Pupils are equal, round, and reactive to light.  Cardiovascular:     Rate and Rhythm: Normal rate and regular rhythm.     Heart sounds: Normal heart sounds.  Pulmonary:      Effort: Pulmonary effort is normal.     Breath sounds: Normal breath sounds.  Abdominal:     General: Bowel sounds are normal.     Palpations: Abdomen is soft.  Musculoskeletal:        General: No tenderness or deformity. Normal range of motion.     Cervical back: Normal range of motion.  Lymphadenopathy:     Cervical: No cervical adenopathy.  Skin:    General: Skin is warm and dry.     Findings: No erythema or rash.  Neurological:     Mental Status: He is alert and oriented to person, place, and time.  Psychiatric:        Behavior: Behavior normal.        Thought Content: Thought content normal.        Judgment: Judgment normal.      Lab Results  Component Value Date   WBC 14.7 (H) 04/24/2022   HGB 14.6 04/24/2022   HCT 45.6 04/24/2022   MCV 94.0 04/24/2022   PLT 91 (L) 04/24/2022   No results found for: "FERRITIN", "IRON", "TIBC", "UIBC", "IRONPCTSAT" Lab Results  Component Value Date   RETICCTPCT 1.0 06/15/2013   RBC 4.85 04/24/2022   RETICCTABS 47.6 06/15/2013   Lab Results  Component Value Date   KPAFRELGTCHN 22.0 (H) 01/19/2020   LAMBDASER 10.7 01/19/2020   KAPLAMBRATIO 2.06 (H) 01/19/2020   Lab Results  Component Value Date   IGGSERUM 1,006 09/17/2021   IGA 110 09/17/2021   IGMSERUM 136 09/17/2021   Lab Results  Component Value Date   TOTALPROTELP 6.5 06/15/2013   ALBUMINELP 61.7 06/15/2013   A1GS 3.5 06/15/2013   A2GS 9.8 06/15/2013   BETS 5.3 06/15/2013   BETA2SER 8.7 (H) 06/15/2013   GAMS 11.0 (L) 06/15/2013   MSPIKE Not Observed 01/25/2017   SPEI * 06/15/2013     Chemistry      Component Value Date/Time   NA 137 04/24/2022 0808   NA 142 06/28/2017 0855   K 4.4 04/24/2022 0808   K 4.5 06/28/2017 0855   CL 99 04/24/2022 0808   CL 99 06/28/2017 0855   CO2 34 (H) 04/24/2022 0808   CO2 32 06/28/2017 0855   BUN 12 04/24/2022 0808   BUN 15 06/28/2017 0855   CREATININE 0.96 04/24/2022 0808   CREATININE 1.0 06/28/2017 0855      Component  Value Date/Time   CALCIUM 9.3 04/24/2022 0808   CALCIUM 9.2 06/28/2017 0855   ALKPHOS 63 04/24/2022 0808   ALKPHOS 74 06/28/2017 0855   AST 12 (L) 04/24/2022 0808   ALT 9 04/24/2022  9471   ALT 13 06/28/2017 0855   BILITOT 0.9 04/24/2022 2527     Impression and Plan: Lucas [name] is very pleasant 81 year old male with CLL. We have not had to treat him now for over 20 years.    Everything looks quite stable from my point of view.  His platelet count is holding low but stable.  There is nothing on his physical exam that would suggest any kind of disease progression.  We will plan to get him back in 6 months.  I am so glad that he enjoyed his trip over to [place].  He goes out to [place] in October for a wedding.     Volanda Napoleon, MD 9/8/20238:51 AM

## 2022-04-25 LAB — IGG, IGA, IGM
IgA: 108 mg/dL (ref 61–437)
IgG (Immunoglobin G), Serum: 993 mg/dL (ref 603–1613)
IgM (Immunoglobulin M), Srm: 122 mg/dL (ref 15–143)

## 2022-06-04 ENCOUNTER — Observation Stay (HOSPITAL_BASED_OUTPATIENT_CLINIC_OR_DEPARTMENT_OTHER): Payer: Medicare Other

## 2022-06-04 ENCOUNTER — Emergency Department (HOSPITAL_COMMUNITY): Payer: Medicare Other

## 2022-06-04 ENCOUNTER — Observation Stay (HOSPITAL_COMMUNITY): Payer: Medicare Other

## 2022-06-04 ENCOUNTER — Encounter (HOSPITAL_COMMUNITY): Payer: Self-pay | Admitting: Emergency Medicine

## 2022-06-04 ENCOUNTER — Other Ambulatory Visit: Payer: Self-pay

## 2022-06-04 ENCOUNTER — Observation Stay (HOSPITAL_COMMUNITY)
Admission: EM | Admit: 2022-06-04 | Discharge: 2022-06-05 | Disposition: A | Discharge status: Home / Self Care | Payer: Medicare Other | Attending: Internal Medicine | Admitting: Internal Medicine

## 2022-06-04 DIAGNOSIS — K769 Liver disease, unspecified: Secondary | ICD-10-CM | POA: Diagnosis not present

## 2022-06-04 DIAGNOSIS — I480 Paroxysmal atrial fibrillation: Secondary | ICD-10-CM | POA: Insufficient documentation

## 2022-06-04 DIAGNOSIS — R55 Syncope and collapse: Principal | ICD-10-CM | POA: Insufficient documentation

## 2022-06-04 DIAGNOSIS — R911 Solitary pulmonary nodule: Secondary | ICD-10-CM | POA: Insufficient documentation

## 2022-06-04 DIAGNOSIS — Z79899 Other long term (current) drug therapy: Secondary | ICD-10-CM | POA: Diagnosis not present

## 2022-06-04 DIAGNOSIS — D72829 Elevated white blood cell count, unspecified: Secondary | ICD-10-CM | POA: Diagnosis not present

## 2022-06-04 DIAGNOSIS — Z7901 Long term (current) use of anticoagulants: Secondary | ICD-10-CM | POA: Insufficient documentation

## 2022-06-04 DIAGNOSIS — E1165 Type 2 diabetes mellitus with hyperglycemia: Secondary | ICD-10-CM | POA: Insufficient documentation

## 2022-06-04 DIAGNOSIS — Z87891 Personal history of nicotine dependence: Secondary | ICD-10-CM | POA: Diagnosis not present

## 2022-06-04 DIAGNOSIS — D696 Thrombocytopenia, unspecified: Secondary | ICD-10-CM | POA: Insufficient documentation

## 2022-06-04 DIAGNOSIS — W19XXXA Unspecified fall, initial encounter: Secondary | ICD-10-CM | POA: Insufficient documentation

## 2022-06-04 DIAGNOSIS — Y92002 Bathroom of unspecified non-institutional (private) residence single-family (private) house as the place of occurrence of the external cause: Secondary | ICD-10-CM | POA: Diagnosis not present

## 2022-06-04 DIAGNOSIS — Z91040 Latex allergy status: Secondary | ICD-10-CM | POA: Diagnosis not present

## 2022-06-04 DIAGNOSIS — C9110 Chronic lymphocytic leukemia of B-cell type not having achieved remission: Secondary | ICD-10-CM | POA: Diagnosis present

## 2022-06-04 LAB — CBG MONITORING, ED
Glucose-Capillary: 202 mg/dL — ABNORMAL HIGH (ref 70–99)
Glucose-Capillary: 195 mg/dL — ABNORMAL HIGH (ref 70–99)
Glucose-Capillary: 194 mg/dL — ABNORMAL HIGH (ref 70–99)
Glucose-Capillary: 240 mg/dL — ABNORMAL HIGH (ref 70–99)
Glucose-Capillary: 194 mg/dL — ABNORMAL HIGH (ref 70–99)
Glucose-Capillary: 248 mg/dL — ABNORMAL HIGH (ref 70–99)

## 2022-06-04 LAB — URINALYSIS, ROUTINE W REFLEX MICROSCOPIC
Bilirubin Urine: NEGATIVE
Glucose, UA: NEGATIVE mg/dL
Hgb urine dipstick: NEGATIVE
Ketones, ur: NEGATIVE mg/dL
Leukocytes,Ua: NEGATIVE
Nitrite: NEGATIVE
Protein, ur: NEGATIVE mg/dL
Specific Gravity, Urine: >1.046 — ABNORMAL HIGH (ref 1.005–1.030)
pH: 5 (ref 5.0–8.0)

## 2022-06-04 LAB — CBC
HCT: 42.5 % (ref 39.0–52.0)
Hemoglobin: 13.8 g/dL (ref 13.0–17.0)
MCH: 30.9 pg (ref 26.0–34.0)
MCHC: 32.5 g/dL (ref 30.0–36.0)
MCV: 95.3 fL (ref 80.0–100.0)
Platelets: 72 10*3/uL — ABNORMAL LOW (ref 150–400)
RBC: 4.46 MIL/uL (ref 4.22–5.81)
RDW: 14 % (ref 11.5–15.5)
WBC: 14.1 10*3/uL — ABNORMAL HIGH (ref 4.0–10.5)
nRBC: 0.4 % — ABNORMAL HIGH (ref 0.0–0.2)

## 2022-06-04 LAB — COMPREHENSIVE METABOLIC PANEL
ALT: 12 U/L (ref 0–44)
AST: 16 U/L (ref 15–41)
Albumin: 3.5 g/dL (ref 3.5–5.0)
Alkaline Phosphatase: 58 U/L (ref 38–126)
Anion gap: 11 (ref 5–15)
BUN: 15 mg/dL (ref 8–23)
CO2: 24 mmol/L (ref 22–32)
Calcium: 8.6 mg/dL — ABNORMAL LOW (ref 8.9–10.3)
Chloride: 100 mmol/L (ref 98–111)
Creatinine, Ser: 1.01 mg/dL (ref 0.61–1.24)
GFR, Estimated: >60 mL/min (ref >60)
Glucose, Bld: 197 mg/dL — ABNORMAL HIGH (ref 70–99)
Potassium: 4 mmol/L (ref 3.5–5.1)
Sodium: 135 mmol/L (ref 135–145)
Total Bilirubin: 0.5 mg/dL (ref 0.3–1.2)
Total Protein: 5.6 g/dL — ABNORMAL LOW (ref 6.5–8.1)

## 2022-06-04 LAB — ETHANOL: Alcohol, Ethyl (B): <10 mg/dL (ref <10)

## 2022-06-04 LAB — I-STAT CHEM 8, ED
BUN: 16 mg/dL (ref 8–23)
Calcium, Ion: 1.07 mmol/L — ABNORMAL LOW (ref 1.15–1.40)
Chloride: 97 mmol/L — ABNORMAL LOW (ref 98–111)
Creatinine, Ser: 0.8 mg/dL (ref 0.61–1.24)
Glucose, Bld: 192 mg/dL — ABNORMAL HIGH (ref 70–99)
HCT: 44 % (ref 39.0–52.0)
Hemoglobin: 15 g/dL (ref 13.0–17.0)
Potassium: 3.7 mmol/L (ref 3.5–5.1)
Sodium: 136 mmol/L (ref 135–145)
TCO2: 25 mmol/L (ref 22–32)

## 2022-06-04 LAB — ECHOCARDIOGRAM COMPLETE
AR max vel: 2.32 cm2
AV Area VTI: 2.26 cm2
AV Area mean vel: 2.38 cm2
AV Mean grad: 3 mmHg
AV Peak grad: 4.4 mmHg
Ao pk vel: 1.05 m/s
Area-P 1/2: 3.91 cm2
S' Lateral: 2.1 cm

## 2022-06-04 LAB — HEMOGLOBIN A1C
Hgb A1c MFr Bld: 7.6 % — ABNORMAL HIGH (ref 4.8–5.6)
Mean Plasma Glucose: 171.42 mg/dL

## 2022-06-04 LAB — SAMPLE TO BLOOD BANK

## 2022-06-04 LAB — PROTIME-INR
INR: 1.3 — ABNORMAL HIGH (ref 0.8–1.2)
Prothrombin Time: 16.2 seconds — ABNORMAL HIGH (ref 11.4–15.2)

## 2022-06-04 LAB — TROPONIN I (HIGH SENSITIVITY)
Troponin I (High Sensitivity): 3 ng/L (ref <18)
Troponin I (High Sensitivity): 5 ng/L (ref <18)

## 2022-06-04 LAB — LACTIC ACID, PLASMA: Lactic Acid, Venous: 2 mmol/L (ref 0.5–1.9)

## 2022-06-04 MED ORDER — INSULIN ASPART 100 UNIT/ML IJ SOLN
0.0000 [IU] | Freq: Every day | INTRAMUSCULAR | Status: DC
  Administered 2022-06-04: 2 [IU] via SUBCUTANEOUS

## 2022-06-04 MED ORDER — METOPROLOL SUCCINATE ER 25 MG PO TB24: 25.0000 mg | ORAL_TABLET | Freq: Every day | ORAL | Status: DC

## 2022-06-04 MED ORDER — IOHEXOL 350 MG/ML SOLN
75.0000 mL | Freq: Once | INTRAVENOUS | Status: AC | PRN
  Administered 2022-06-04: 75 mL via INTRAVENOUS

## 2022-06-04 MED ORDER — ACETAMINOPHEN 325 MG PO TABS: 650.0000 mg | ORAL_TABLET | Freq: Four times a day (QID) | ORAL | Status: DC | PRN

## 2022-06-04 MED ORDER — SODIUM CHLORIDE 0.9 % IV BOLUS
500.0000 mL | Freq: Once | INTRAVENOUS | Status: AC
  Administered 2022-06-04: 500 mL via INTRAVENOUS

## 2022-06-04 MED ORDER — SODIUM CHLORIDE 0.9 % IV SOLN: INTRAVENOUS | Status: DC

## 2022-06-04 MED ORDER — APIXABAN 5 MG PO TABS
5.0000 mg | ORAL_TABLET | Freq: Two times a day (BID) | ORAL | Status: DC
  Administered 2022-06-04 – 2022-06-05 (×3): 5 mg via ORAL
  Filled 2022-06-04 (×3): qty 1

## 2022-06-04 MED ORDER — INSULIN ASPART 100 UNIT/ML IJ SOLN
0.0000 [IU] | Freq: Three times a day (TID) | INTRAMUSCULAR | Status: DC
  Administered 2022-06-04: 2 [IU] via SUBCUTANEOUS
  Administered 2022-06-04 – 2022-06-05 (×3): 1 [IU] via SUBCUTANEOUS

## 2022-06-04 MED ORDER — SODIUM CHLORIDE 0.9 % IV SOLN: INTRAVENOUS | Status: AC

## 2022-06-04 MED ORDER — SODIUM CHLORIDE 0.9% FLUSH
3.0000 mL | Freq: Two times a day (BID) | INTRAVENOUS | Status: DC
  Administered 2022-06-04 – 2022-06-05 (×2): 3 mL via INTRAVENOUS

## 2022-06-04 MED ORDER — METOPROLOL SUCCINATE ER 25 MG PO TB24
25.0000 mg | ORAL_TABLET | Freq: Every day | ORAL | Status: DC
  Filled 2022-06-04: qty 1

## 2022-06-04 MED ORDER — ACETAMINOPHEN 650 MG RE SUPP: 650.0000 mg | Freq: Four times a day (QID) | RECTAL | Status: DC | PRN

## 2022-06-04 NOTE — Progress Notes (Signed)
Orthopedic Tech Progress Note Patient Details:  Lucas [name] March 23, 1941 [id number]  Patient ID: Lucas [name], male   DOB: February 01, 1941, 81 y.o.   MRN: [id number] Level II; not needed. Lucas [name] 06/04/2022, 2:23 AM

## 2022-06-04 NOTE — Progress Notes (Signed)
EEG complete - results pending 

## 2022-06-04 NOTE — Procedures (Signed)
Patient Name: Lucas [name]  MRN: [id number]  Epilepsy Attending: Lora Havens  Referring Physician/Provider: Vianne Bulls, MD Date: 06/04/2022 Duration: 22.35 mins  Patient history: 81yo M with seizure like activity. EEG to evaluate for seizure.  Level of alertness: Awake, asleep  AEDs during EEG study: None  Technical aspects: This EEG study was done with scalp electrodes positioned according to the 10-20 International system of electrode placement. Electrical activity was reviewed with band pass filter of 1-'70Hz'$ , sensitivity of 7 uV/mm, display speed of 50m/sec with a '60Hz'$  notched filter applied as appropriate. EEG data were recorded continuously and digitally stored.  Video monitoring was available and reviewed as appropriate.  Description: The posterior dominant rhythm consists of 8-9 Hz activity of moderate voltage (25-35 uV) seen predominantly in posterior head regions, symmetric and reactive to eye opening and eye closing. Sleep was characterized by vertex waves, sleep spindles (12 to 14 Hz), maximal frontocentral region. Hyperventilation and photic stimulation were not performed.     IMPRESSION: This study is within normal limits. No seizures or epileptiform discharges were seen throughout the recording.  A normal interictal EEG does not exclude the diagnosis of epilepsy.   [name] [name]

## 2022-06-04 NOTE — ED Triage Notes (Signed)
Pt brought to ED by [facility] for evaluation after syncopal fall in bathroom this am. Pt's wife reported possible seizure like activity after fall. Takes Eliquis. Pt reports taking Viagra at 2100 yesterday evening.

## 2022-06-04 NOTE — Progress Notes (Signed)
Patient admitted early this morning for lightheadedness and loss of consciousness.  CT of the head and cervical spine were negative for acute findings.  Patient seen and examined at bedside and plan of care discussed with him and wife at bedside.  I have reviewed patient's medical records including this morning's H&P, current vitals, labs and medications myself.  Await echocardiogram and EEG.  Continue telemetry monitoring.

## 2022-06-04 NOTE — ED Notes (Signed)
Patient reports ambulating independently to the bathroom to urinate and having a bowel movement while in the ED. Patient reports eating a bagel for dinner. Denies any complaints. Patient's wife is at the bedside.

## 2022-06-04 NOTE — ED Provider Notes (Signed)
[facility] EMERGENCY DEPARTMENT Provider Note   CSN: [id number] Arrival date & time: 06/04/22  2841     History  Chief Complaint  Patient presents with   Lytle Michaels    Lucas [name] is a 81 y.o. male.  The history is provided by the patient.  Fall This is a new problem. The current episode started less than 1 hour ago. The problem occurs constantly. The problem has been resolved. Pertinent negatives include no chest pain. Nothing aggravates the symptoms. Nothing relieves the symptoms. The treatment provided moderate relief.  Loss of Consciousness Episode history:  Single Most recent episode:  Today Duration: minutes, may have had seizure like activity. Timing:  Constant Progression:  Resolved Chronicity:  New Context comment:  Walked to the bathroom and was lightheaded Relieved by:  Nothing Worsened by:  Nothing Ineffective treatments:  None tried Associated symptoms: dizziness and recent fall   Associated symptoms: no chest pain, no fever and no vomiting   Risk factors: no seizures   Patient with PAF on Eliquis presents following being dizzy and then had a syncopal event with questions seizure like activity.       Home Medications Prior to Admission medications   Medication Sig Start Date End Date Taking? Authorizing Provider  brimonidine (ALPHAGAN) 0.2 % ophthalmic solution Place 1 drop into the left eye 2 (two) times daily.  08/05/11   [provider]  chlorpheniramine (CHLOR-TRIMETON) 4 MG tablet Take 4 mg by mouth daily as needed for allergies.    [provider]  ELIQUIS 5 MG TABS tablet TAKE 1 TABLET BY MOUTH TWICE  DAILY 12/29/21   [name], MD  ketoconazole (NIZORAL) 2 % cream  07/23/20   [provider]  meclizine (ANTIVERT) 25 MG tablet Take 25 mg by mouth 3 (three) times daily as needed. 06/06/20   [provider]  metFORMIN (GLUCOPHAGE) 500 MG tablet Take 500 mg by mouth daily.  04/28/17   [provider]  metoprolol succinate (TOPROL-XL) 25 MG 24 hr tablet TAKE 1 TABLET BY MOUTH DAILY 02/02/22   [name], MD  sildenafil (VIAGRA) 50 MG tablet Take 1 tablet (50 mg total) by mouth daily as needed for erectile dysfunction. 09/03/14   Brett Canales, PA-C  timolol (TIMOPTIC) 0.5 % ophthalmic solution Place 1 drop into the left eye daily. Left eye 06/25/11   [provider]      Allergies    Nsaids, Other, Shellfish allergy, Shellfish-derived products, Adhesive [tape], and Latex    Review of Systems   Review of Systems  Constitutional:  Negative for fever.  HENT:  Negative for facial swelling.   Eyes:  Negative for redness.  Respiratory:  Negative for wheezing and stridor.   Cardiovascular:  Positive for syncope. Negative for chest pain.  Gastrointestinal:  Negative for vomiting.  Neurological:  Positive for dizziness and light-headedness.    Physical Exam Updated Vital Signs BP 124/64 (BP Location: Right Arm)   Pulse 60   Temp 97.9 F (36.6 C) (Temporal)   Resp 13   SpO2 97%  Physical Exam Vitals and nursing note reviewed. Exam conducted with a chaperone present.  Constitutional:      General: He is not in acute distress.    Appearance: Normal appearance. He is well-developed. He is not diaphoretic.  HENT:     Head: Normocephalic and atraumatic.     Nose: Nose normal.  Eyes:     Conjunctiva/sclera: Conjunctivae normal.  Pupils: Pupils are equal, round, and reactive to light.  Cardiovascular:     Rate and Rhythm: Normal rate and regular rhythm.     Pulses: Normal pulses.     Heart sounds: Normal heart sounds.  Pulmonary:     Effort: Pulmonary effort is normal.     Breath sounds: Normal breath sounds. No wheezing or rales.  Abdominal:     General: Bowel sounds are normal.     Palpations: Abdomen is soft.     Tenderness: There is no abdominal tenderness. There is no guarding or rebound.  Musculoskeletal:        General: Normal range of  motion.     Cervical back: Normal range of motion and neck supple.  Skin:    General: Skin is warm and dry.     Capillary Refill: Capillary refill takes less than 2 seconds.  Neurological:     General: No focal deficit present.     Mental Status: He is alert.     Deep Tendon Reflexes: Reflexes normal.  Psychiatric:        Mood and Affect: Mood normal.     ED Results / Procedures / Treatments   Labs (all labs ordered are listed, but only abnormal results are displayed) Results for orders placed or performed during the hospital encounter of 06/04/22  Comprehensive metabolic panel  Result Value Ref Range   Sodium 135 135 - 145 mmol/L   Potassium 4.0 3.5 - 5.1 mmol/L   Chloride 100 98 - 111 mmol/L   CO2 24 22 - 32 mmol/L   Glucose, Bld 197 (H) 70 - 99 mg/dL   BUN 15 8 - 23 mg/dL   Creatinine, Ser 1.01 0.61 - 1.24 mg/dL   Calcium 8.6 (L) 8.9 - 10.3 mg/dL   Total Protein 5.6 (L) 6.5 - 8.1 g/dL   Albumin 3.5 3.5 - 5.0 g/dL   AST 16 15 - 41 U/L   ALT 12 0 - 44 U/L   Alkaline Phosphatase 58 38 - 126 U/L   Total Bilirubin 0.5 0.3 - 1.2 mg/dL   GFR, Estimated >60 >60 mL/min   Anion gap 11 5 - 15  CBC  Result Value Ref Range   WBC 14.1 (H) 4.0 - 10.5 K/uL   RBC 4.46 4.22 - 5.81 MIL/uL   Hemoglobin 13.8 13.0 - 17.0 g/dL   HCT 42.5 39.0 - 52.0 %   MCV 95.3 80.0 - 100.0 fL   MCH 30.9 26.0 - 34.0 pg   MCHC 32.5 30.0 - 36.0 g/dL   RDW 14.0 11.5 - 15.5 %   Platelets 72 (L) 150 - 400 K/uL   nRBC 0.4 (H) 0.0 - 0.2 %  Ethanol  Result Value Ref Range   Alcohol, Ethyl (B) <10 <10 mg/dL  Lactic acid, plasma  Result Value Ref Range   Lactic Acid, Venous 2.0 (HH) 0.5 - 1.9 mmol/L  Protime-INR  Result Value Ref Range   Prothrombin Time 16.2 (H) 11.4 - 15.2 seconds   INR 1.3 (H) 0.8 - 1.2  I-Stat Chem 8, ED  Result Value Ref Range   Sodium 136 135 - 145 mmol/L   Potassium 3.7 3.5 - 5.1 mmol/L   Chloride 97 (L) 98 - 111 mmol/L   BUN 16 8 - 23 mg/dL   Creatinine, Ser 0.80 0.61 -  1.24 mg/dL   Glucose, Bld 192 (H) 70 - 99 mg/dL   Calcium, Ion 1.07 (L) 1.15 - 1.40 mmol/L   TCO2 25  22 - 32 mmol/L   Hemoglobin 15.0 13.0 - 17.0 g/dL   HCT 44.0 39.0 - 52.0 %  CBG monitoring, ED  Result Value Ref Range   Glucose-Capillary 202 (H) 70 - 99 mg/dL  Sample to Blood Bank  Result Value Ref Range   Blood Bank Specimen SAMPLE AVAILABLE FOR TESTING    Sample Expiration      06/05/2022,2359 Performed at [facility], [place] [address] [place], [place] [address]   Troponin I (High Sensitivity)  Result Value Ref Range   Troponin I (High Sensitivity) 3 <18 ng/L   CT HEAD WO CONTRAST  Result Date: 06/04/2022 CLINICAL DATA:  Fall on blood thinners EXAM: CT HEAD WITHOUT CONTRAST CT CERVICAL SPINE WITHOUT CONTRAST TECHNIQUE: Multidetector CT imaging of the head and cervical spine was performed following the standard protocol without intravenous contrast. Multiplanar CT image reconstructions of the cervical spine were also generated. RADIATION DOSE REDUCTION: This exam was performed according to the departmental dose-optimization program which includes automated exposure control, adjustment of the mA and/or kV according to patient size and/or use of iterative reconstruction technique. COMPARISON:  None Available. FINDINGS: CT HEAD FINDINGS Brain: No intracranial hemorrhage, mass effect, or evidence of acute infarct. No hydrocephalus. No extra-axial fluid collection. Generalized cerebral atrophy. Ill-defined hypoattenuation within the cerebral white matter is nonspecific but consistent with chronic small vessel ischemic disease. Vascular: No hyperdense vessel. Intracranial arterial calcification. Skull: No fracture or focal lesion. Sinuses/Orbits: No acute finding. Paranasal sinuses and mastoid air cells are well aerated. Other: None. CT CERVICAL SPINE FINDINGS Alignment: No traumatic malalignment Skull base and vertebrae: No acute fracture. No primary bone lesion or focal pathologic  process. Soft tissues and spinal canal: No prevertebral fluid or swelling. No visible canal hematoma. Disc levels: Multilevel spondylosis and disc space height loss greatest at C5-T1 where it is moderate-advanced. Posterior disc osteophyte complex at C6-C7 causes mild effacement of the ventral thecal sac. No high-grade spinal canal narrowing. Uncovertebral spurring and facet arthropathy cause advanced neural foraminal narrowing bilaterally at C3-C4 and on the right at C6-C7 and on the right at C7-T1. Upper chest: No acute abnormality. Other: None. IMPRESSION: 1. No acute intracranial abnormality. Generalized atrophy and small vessel white matter disease. 2. No acute fracture in the cervical spine. Multilevel degenerative spondylosis. Electronically Signed   By: Placido Sou M.D.   On: 06/04/2022 03:05   CT CERVICAL SPINE WO CONTRAST  Result Date: 06/04/2022 CLINICAL DATA:  Fall on blood thinners EXAM: CT HEAD WITHOUT CONTRAST CT CERVICAL SPINE WITHOUT CONTRAST TECHNIQUE: Multidetector CT imaging of the head and cervical spine was performed following the standard protocol without intravenous contrast. Multiplanar CT image reconstructions of the cervical spine were also generated. RADIATION DOSE REDUCTION: This exam was performed according to the departmental dose-optimization program which includes automated exposure control, adjustment of the mA and/or kV according to patient size and/or use of iterative reconstruction technique. COMPARISON:  None Available. FINDINGS: CT HEAD FINDINGS Brain: No intracranial hemorrhage, mass effect, or evidence of acute infarct. No hydrocephalus. No extra-axial fluid collection. Generalized cerebral atrophy. Ill-defined hypoattenuation within the cerebral white matter is nonspecific but consistent with chronic small vessel ischemic disease. Vascular: No hyperdense vessel. Intracranial arterial calcification. Skull: No fracture or focal lesion. Sinuses/Orbits: No acute  finding. Paranasal sinuses and mastoid air cells are well aerated. Other: None. CT CERVICAL SPINE FINDINGS Alignment: No traumatic malalignment Skull base and vertebrae: No acute fracture. No primary bone lesion or focal pathologic process. Soft tissues  and spinal canal: No prevertebral fluid or swelling. No visible canal hematoma. Disc levels: Multilevel spondylosis and disc space height loss greatest at C5-T1 where it is moderate-advanced. Posterior disc osteophyte complex at C6-C7 causes mild effacement of the ventral thecal sac. No high-grade spinal canal narrowing. Uncovertebral spurring and facet arthropathy cause advanced neural foraminal narrowing bilaterally at C3-C4 and on the right at C6-C7 and on the right at C7-T1. Upper chest: No acute abnormality. Other: None. IMPRESSION: 1. No acute intracranial abnormality. Generalized atrophy and small vessel white matter disease. 2. No acute fracture in the cervical spine. Multilevel degenerative spondylosis. Electronically Signed   By: Placido Sou M.D.   On: 06/04/2022 03:05   DG Pelvis Portable  Result Date: 06/04/2022 CLINICAL DATA:  Fall on blood thinners EXAM: PORTABLE PELVIS 1-2 VIEWS COMPARISON:  None Available. FINDINGS: There is no evidence of pelvic fracture or diastasis. No pelvic bone lesions are seen. IMPRESSION: No acute fracture. Electronically Signed   By: Placido Sou M.D.   On: 06/04/2022 02:28   DG Chest Port 1 View  Result Date: 06/04/2022 CLINICAL DATA:  Fall on blood thinners EXAM: PORTABLE CHEST 1 VIEW COMPARISON:  Radiographs 11/25/2021 FINDINGS: No focal consolidation, pleural effusion, or pneumothorax. Mild widening of the superior mediastinum is presumed due to supine technique however continued attention on upcoming CT chest with contrast is recommended. No displaced rib fractures. IMPRESSION: Mild widening of the superior mediastinum is presumed due to supine technique however continued attention on upcoming CT chest  with contrast is recommended. Electronically Signed   By: Placido Sou M.D.   On: 06/04/2022 02:27     EKG EKG Interpretation  Date/Time:  Thursday June 04 2022 02:19:39 EDT Ventricular Rate:  60 PR Interval:  209 QRS Duration: 99 QT Interval:  414 QTC Calculation: 414 R Axis:   18 Text Interpretation: Sinus rhythm Confirmed by Randal Buba, Tennyson Kallen ([id number]) on 06/04/2022 3:12:02 AM  Radiology CT HEAD WO CONTRAST  Result Date: 06/04/2022 CLINICAL DATA:  Fall on blood thinners EXAM: CT HEAD WITHOUT CONTRAST CT CERVICAL SPINE WITHOUT CONTRAST TECHNIQUE: Multidetector CT imaging of the head and cervical spine was performed following the standard protocol without intravenous contrast. Multiplanar CT image reconstructions of the cervical spine were also generated. RADIATION DOSE REDUCTION: This exam was performed according to the departmental dose-optimization program which includes automated exposure control, adjustment of the mA and/or kV according to patient size and/or use of iterative reconstruction technique. COMPARISON:  None Available. FINDINGS: CT HEAD FINDINGS Brain: No intracranial hemorrhage, mass effect, or evidence of acute infarct. No hydrocephalus. No extra-axial fluid collection. Generalized cerebral atrophy. Ill-defined hypoattenuation within the cerebral white matter is nonspecific but consistent with chronic small vessel ischemic disease. Vascular: No hyperdense vessel. Intracranial arterial calcification. Skull: No fracture or focal lesion. Sinuses/Orbits: No acute finding. Paranasal sinuses and mastoid air cells are well aerated. Other: None. CT CERVICAL SPINE FINDINGS Alignment: No traumatic malalignment Skull base and vertebrae: No acute fracture. No primary bone lesion or focal pathologic process. Soft tissues and spinal canal: No prevertebral fluid or swelling. No visible canal hematoma. Disc levels: Multilevel spondylosis and disc space height loss greatest at C5-T1 where it is  moderate-advanced. Posterior disc osteophyte complex at C6-C7 causes mild effacement of the ventral thecal sac. No high-grade spinal canal narrowing. Uncovertebral spurring and facet arthropathy cause advanced neural foraminal narrowing bilaterally at C3-C4 and on the right at C6-C7 and on the right at C7-T1. Upper chest: No acute abnormality. Other: None. IMPRESSION:  1. No acute intracranial abnormality. Generalized atrophy and small vessel white matter disease. 2. No acute fracture in the cervical spine. Multilevel degenerative spondylosis. Electronically Signed   By: Placido Sou M.D.   On: 06/04/2022 03:05   CT CERVICAL SPINE WO CONTRAST  Result Date: 06/04/2022 CLINICAL DATA:  Fall on blood thinners EXAM: CT HEAD WITHOUT CONTRAST CT CERVICAL SPINE WITHOUT CONTRAST TECHNIQUE: Multidetector CT imaging of the head and cervical spine was performed following the standard protocol without intravenous contrast. Multiplanar CT image reconstructions of the cervical spine were also generated. RADIATION DOSE REDUCTION: This exam was performed according to the departmental dose-optimization program which includes automated exposure control, adjustment of the mA and/or kV according to patient size and/or use of iterative reconstruction technique. COMPARISON:  None Available. FINDINGS: CT HEAD FINDINGS Brain: No intracranial hemorrhage, mass effect, or evidence of acute infarct. No hydrocephalus. No extra-axial fluid collection. Generalized cerebral atrophy. Ill-defined hypoattenuation within the cerebral white matter is nonspecific but consistent with chronic small vessel ischemic disease. Vascular: No hyperdense vessel. Intracranial arterial calcification. Skull: No fracture or focal lesion. Sinuses/Orbits: No acute finding. Paranasal sinuses and mastoid air cells are well aerated. Other: None. CT CERVICAL SPINE FINDINGS Alignment: No traumatic malalignment Skull base and vertebrae: No acute fracture. No primary  bone lesion or focal pathologic process. Soft tissues and spinal canal: No prevertebral fluid or swelling. No visible canal hematoma. Disc levels: Multilevel spondylosis and disc space height loss greatest at C5-T1 where it is moderate-advanced. Posterior disc osteophyte complex at C6-C7 causes mild effacement of the ventral thecal sac. No high-grade spinal canal narrowing. Uncovertebral spurring and facet arthropathy cause advanced neural foraminal narrowing bilaterally at C3-C4 and on the right at C6-C7 and on the right at C7-T1. Upper chest: No acute abnormality. Other: None. IMPRESSION: 1. No acute intracranial abnormality. Generalized atrophy and small vessel white matter disease. 2. No acute fracture in the cervical spine. Multilevel degenerative spondylosis. Electronically Signed   By: Placido Sou M.D.   On: 06/04/2022 03:05   DG Pelvis Portable  Result Date: 06/04/2022 CLINICAL DATA:  Fall on blood thinners EXAM: PORTABLE PELVIS 1-2 VIEWS COMPARISON:  None Available. FINDINGS: There is no evidence of pelvic fracture or diastasis. No pelvic bone lesions are seen. IMPRESSION: No acute fracture. Electronically Signed   By: Placido Sou M.D.   On: 06/04/2022 02:28   DG Chest Port 1 View  Result Date: 06/04/2022 CLINICAL DATA:  Fall on blood thinners EXAM: PORTABLE CHEST 1 VIEW COMPARISON:  Radiographs 11/25/2021 FINDINGS: No focal consolidation, pleural effusion, or pneumothorax. Mild widening of the superior mediastinum is presumed due to supine technique however continued attention on upcoming CT chest with contrast is recommended. No displaced rib fractures. IMPRESSION: Mild widening of the superior mediastinum is presumed due to supine technique however continued attention on upcoming CT chest with contrast is recommended. Electronically Signed   By: Placido Sou M.D.   On: 06/04/2022 02:27    Procedures Procedures    Medications Ordered in ED Medications  sodium chloride 0.9 %  bolus 500 mL (has no administration in time range)  iohexol (OMNIPAQUE) 350 MG/ML injection 75 mL (75 mLs Intravenous Contrast Given 06/04/22 0238)    ED Course/ Medical Decision Making/ A&P                           Medical Decision Making Patient with syncope and then reported seizure like activity   Amount and/or  Complexity of Data Reviewed Independent Historian: EMS    Details: See above  External Data Reviewed: ECG and notes.    Details: Previous notes EKG and echo reviewed  Labs: ordered.    Details: All labs reviewed: normal sodium 135, normal potassium 4.0, elevated sugar 197,  normal creatinine 1.01, normal LFTs.  Elevated white 14.1, 13.8 normal, platelets low at 72K.  First troponin 3, elevated lactate 2  Radiology: ordered and independent interpretation performed. Decision-making details documented in ED Course.    Details: Negative head and C spine ct by me Pelvis xray negative by me  ECG/medicine tests: ordered and independent interpretation performed. Decision-making details documented in ED Course.  Risk Prescription drug management. Decision regarding hospitalization.    Final Clinical Impression(s) / ED Diagnoses Final diagnoses:  Syncope, unspecified syncope type  Fall, initial encounter   The patient appears reasonably stabilized for admission considering the current resources, flow, and capabilities available in the ED at this time, and I doubt any other [facility] requiring further screening and/or treatment in the ED prior to admission.  Rx / DC Orders ED Discharge Orders     None         Yaasir Menken, MD 06/04/22 [phone]

## 2022-06-04 NOTE — ED Notes (Signed)
Trauma Response Nurse Documentation   Lucas [name] is a 81 y.o. male arriving to [facility] ED via [facility]  On Eliquis (apixaban) daily. Trauma was activated as a Level 2 by Launa Flight based on the following trauma criteria Elderly patients > 65 with head trauma on anti-coagulation (excluding ASA). Trauma team at the bedside on patient arrival.   Patient cleared for CT by Dr. Randal Buba. Pt transported to CT with trauma response nurse present to monitor. RN remained with the patient throughout their absence from the department for clinical observation.   GCS 15.  History   Past Medical History:  Diagnosis Date   Atrial fibrillation ([place])    paroxysmal; Normal Coronary Arteries on Cath Jan 2013   Cataract    CLL (chronic lymphoblastic leukemia) 09/03/2011   dx 1999   Diabetes mellitus,borderline 09/10/2011   Glaucoma    Goals of care, counseling/discussion 09/17/2021     Past Surgical History:  Procedure Laterality Date   CARDIOVASCULAR STRESS TEST  09/15/2011   Mild-moderate perfusion defect due to infarct/scar w/ moderate perinfarct ischemia seen in basal inferoseptal, basal inferior, mid inferoseptal, mid inferior and apical inferior regions. Observed defect may in part be attributable to diagphragmatic attenuation, however there is also reversibility that would suggest a true defect. EKG negative for ischemia.   CATARACT EXTRACTION  2012   COLONOSCOPY     CYST REMOVAL TRUNK  2010   back   GUM SURGERY  111/2021   for a crown   LEFT HEART CATHETERIZATION WITH CORONARY ANGIOGRAM N/A 09/17/2011   Procedure: LEFT HEART CATHETERIZATION WITH CORONARY ANGIOGRAM;  Surgeon: [name], MD;  Location: [facility] CATH LAB: Normal Coronary Arteries. EF ~60%. No RWMA   ROTATOR CUFF REPAIR  1999   TONSILLECTOMY     TRANSTHORACIC ECHOCARDIOGRAM  09/15/2011; Dec 2014   EF >55%, Normal LV systolic function; EF 02-58%. GR 1 DD. No RWMA.  Borderline left atrial dilation. Otherwise  normal       Initial Focused Assessment (If applicable, or please see trauma documentation): Airway-- intact, no obstruction Breathing-- spontaneous, unlabored Circulation-- no bleeding noted  CT's Completed:   CT Head, CT C-Spine, CT Chest w/ contrast, and CT abdomen/pelvis w/ contrast   Interventions:  See event summary  Plan for disposition:  Unknown at this time.  Consults completed:  none at 0249.  Event Summary: Patient brought in by [facility] from home. Patients experienced syncope and fall tonight while in bathroom, per wife patient may have struck his head on a desk. Patient alert and oreinted x4, GCS 15. Vital signs stable. Patient reports taking Viagra earlier tonight. Patient with abrasion to right flank area. Trauma labs obtained. Xray chest and pelvis completed. Patient taken to CT by TRN. CT head, c-spine, chest/abdomen/pelvis completed.   Bedside handoff with ED RN Maudie Mercury.    Trudee Kuster  Trauma Response RN  Please call TRN at [phone] for further assistance.

## 2022-06-04 NOTE — Progress Notes (Signed)
PT Cancellation Note  Patient Details Name: Lucas [name] MRN: [id number] DOB: Apr 24, 1941   Cancelled Treatment:    Reason Eval/Treat Not Completed: PT screened, no needs identified, will sign off.  Pt reports he is walking fine, has been up to BR and no issues.  Wife confirms.  Will sign off for now but encouraged him to let nursing know if this changes.   Ramond Dial 06/04/2022, 2:59 PM  Mee Hives, PT PhD Acute Rehab Dept. Number: [place]

## 2022-06-04 NOTE — H&P (Addendum)
History and Physical    DANNI LEABO XHB:[id number] DOB: 01-22-1941 DOA: 06/04/2022  PCP: Burnard Bunting, MD   Patient coming from: Home   Chief Complaint: Lightheaded, LOC   HPI: BHAVESH VAZQUEZ is a pleasant 81 y.o. male with medical history significant for paroxysmal atrial fibrillation on Eliquis, CLL not currently requiring treatment, and type 2 diabetes mellitus who presents to the emergency department after a transient loss of consciousness.  Patient took Viagra last night, felt well when he woke to use the bathroom at approximately 1:30 AM, but became acutely lightheaded in the bathroom and had a brief LOC.  His wife heard him fall and noted the patient to be rigid with a few seconds of generalized shaking.  He regained awareness within a minute but was still too lightheaded to stand.  He denies any associated chest pain or palpitations.  ED Course: Upon arrival to the ED, patient is found to be afebrile and saturating well on room air with stable blood pressure.  EKG features sinus rhythm with first-degree AV nodal block.  Head CT and cervical spine CT were negative for acute findings.  CT of the chest/abdomen/pelvis is notable for indeterminate liver hypodensities, lung nodules, prostatomegaly, and retraction of the right testis into the inguinal canal.  Blood work notable for lactic acid of 2.0, chronic leukocytosis, and chronic thrombocytopenia.  Patient was given a liter of normal saline in the ED.  Review of Systems:  All other systems reviewed and apart from HPI, are negative.  Past Medical History:  Diagnosis Date   Atrial fibrillation ([place])    paroxysmal; Normal Coronary Arteries on Cath Jan 2013   Cataract    CLL (chronic lymphoblastic leukemia) 09/03/2011   dx 1999   Diabetes mellitus,borderline 09/10/2011   Glaucoma    Goals of care, counseling/discussion 09/17/2021    Past Surgical History:  Procedure Laterality Date   CARDIOVASCULAR STRESS TEST  09/15/2011    Mild-moderate perfusion defect due to infarct/scar w/ moderate perinfarct ischemia seen in basal inferoseptal, basal inferior, mid inferoseptal, mid inferior and apical inferior regions. Observed defect may in part be attributable to diagphragmatic attenuation, however there is also reversibility that would suggest a true defect. EKG negative for ischemia.   CATARACT EXTRACTION  2012   COLONOSCOPY     CYST REMOVAL TRUNK  2010   back   GUM SURGERY  111/2021   for a crown   LEFT HEART CATHETERIZATION WITH CORONARY ANGIOGRAM N/A 09/17/2011   Procedure: LEFT HEART CATHETERIZATION WITH CORONARY ANGIOGRAM;  Surgeon: [name], MD;  Location: [facility] CATH LAB: Normal Coronary Arteries. EF ~60%. No RWMA   ROTATOR CUFF REPAIR  1999   TONSILLECTOMY     TRANSTHORACIC ECHOCARDIOGRAM  09/15/2011; Dec 2014   EF >55%, Normal LV systolic function; EF 81-01%. GR 1 DD. No RWMA.  Borderline left atrial dilation. Otherwise normal    Social History:   reports that he quit smoking about 56 years ago. His smoking use included cigarettes. He started smoking about 59 years ago. He has a 3.00 pack-year smoking history. He has never used smokeless tobacco. He reports current alcohol use of about 4.0 standard drinks of alcohol per week. He reports that he does not use drugs.  Allergies  Allergen Reactions   Nsaids Other (See Comments)    DO NOT take with Eliquis   Other Anaphylaxis    Shellfish   Shellfish Allergy Anaphylaxis and Swelling   Shellfish-Derived Products Anaphylaxis   Adhesive [Tape]  Other (See Comments)    REDNESS   Latex Rash    Family History  Problem Relation Age of Onset   Cancer Mother        Multiple myeloma   Colon cancer Mother    Heart attack Father        Died age 86   Stroke Father    Cancer Sister        Breast cancer   Heart attack Paternal Grandmother    Cancer Paternal Grandmother        Breast cancer   Stroke Paternal Grandfather    Esophageal cancer Neg Hx     Rectal cancer Neg Hx    Stomach cancer Neg Hx      Prior to Admission medications   Medication Sig Start Date End Date Taking? Authorizing Provider  brimonidine (ALPHAGAN) 0.2 % ophthalmic solution Place 1 drop into the left eye 2 (two) times daily.  08/05/11   [provider]  chlorpheniramine (CHLOR-TRIMETON) 4 MG tablet Take 4 mg by mouth daily as needed for allergies.    [provider]  ELIQUIS 5 MG TABS tablet TAKE 1 TABLET BY MOUTH TWICE  DAILY 12/29/21   [name], MD  ketoconazole (NIZORAL) 2 % cream  07/23/20   [provider]  meclizine (ANTIVERT) 25 MG tablet Take 25 mg by mouth 3 (three) times daily as needed. 06/06/20   [provider]  metFORMIN (GLUCOPHAGE) 500 MG tablet Take 500 mg by mouth daily.  04/28/17   [provider]  metoprolol succinate (TOPROL-XL) 25 MG 24 hr tablet TAKE 1 TABLET BY MOUTH DAILY 02/02/22   [name], MD  sildenafil (VIAGRA) 50 MG tablet Take 1 tablet (50 mg total) by mouth daily as needed for erectile dysfunction. 09/03/14   Brett Canales, PA-C  timolol (TIMOPTIC) 0.5 % ophthalmic solution Place 1 drop into the left eye daily. Left eye 06/25/11   [provider]    Physical Exam: Vitals:   06/04/22 0230 06/04/22 0231 06/04/22 0303  BP:  124/64   Pulse: 60    Resp: 13    Temp:   97.9 F (36.6 C)  TempSrc:   Temporal  SpO2: 97%      Constitutional: NAD, calm  Eyes: PERTLA, lids and conjunctivae normal ENMT: Mucous membranes are moist. Posterior pharynx clear of any exudate or lesions.   Neck: supple, no masses  Respiratory:  no wheezing, no crackles. No accessory muscle use.  Cardiovascular: S1 & S2 heard, regular rate and rhythm. No extremity edema.   Abdomen: No distension, no tenderness, soft. Bowel sounds active.  Musculoskeletal: no clubbing / cyanosis. No joint deformity upper and lower extremities.   Skin: no significant rashes, lesions, ulcers. Warm, dry,  well-perfused. Neurologic: CN 2-12 grossly intact. Sensation intact. Strength 5/5 in all 4 limbs. Alert and oriented.  Psychiatric: Pleasant. Cooperative.    Labs and Imaging on Admission: I have personally reviewed following labs and imaging studies  CBC: Recent Labs  Lab 06/04/22 0213 06/04/22 0221  WBC 14.1*  --   HGB 13.8 15.0  HCT 42.5 44.0  MCV 95.3  --   PLT 72*  --    Basic Metabolic Panel: Recent Labs  Lab 06/04/22 0213 06/04/22 0221  NA 135 136  K 4.0 3.7  CL 100 97*  CO2 24  --   GLUCOSE 197* 192*  BUN 15 16  CREATININE 1.01 0.80  CALCIUM 8.6*  --  GFR: CrCl cannot be calculated (Unknown ideal weight.). Liver Function Tests: Recent Labs  Lab 06/04/22 0213  AST 16  ALT 12  ALKPHOS 58  BILITOT 0.5  PROT 5.6*  ALBUMIN 3.5   No results for input(s): "LIPASE", "AMYLASE" in the last 168 hours. No results for input(s): "AMMONIA" in the last 168 hours. Coagulation Profile: Recent Labs  Lab 06/04/22 0213  INR 1.3*   Cardiac Enzymes: No results for input(s): "CKTOTAL", "CKMB", "CKMBINDEX", "TROPONINI" in the last 168 hours. BNP (last 3 results) No results for input(s): "PROBNP" in the last 8760 hours. HbA1C: No results for input(s): "HGBA1C" in the last 72 hours. CBG: Recent Labs  Lab 06/04/22 0215  GLUCAP 202*   Lipid Profile: No results for input(s): "CHOL", "HDL", "LDLCALC", "TRIG", "CHOLHDL", "LDLDIRECT" in the last 72 hours. Thyroid Function Tests: No results for input(s): "TSH", "T4TOTAL", "FREET4", "T3FREE", "THYROIDAB" in the last 72 hours. Anemia Panel: No results for input(s): "VITAMINB12", "FOLATE", "FERRITIN", "TIBC", "IRON", "RETICCTPCT" in the last 72 hours. Urine analysis:    Component Value Date/Time   COLORURINE YELLOW 08/29/2018 [place] 08/29/2018 1313   LABSPEC 1.008 08/29/2018 1313   PHURINE 6.0 08/29/2018 1313   GLUCOSEU NEGATIVE 08/29/2018 1313   HGBUR NEGATIVE 08/29/2018 1313   BILIRUBINUR  NEGATIVE 08/29/2018 1313   KETONESUR NEGATIVE 08/29/2018 1313   PROTEINUR NEGATIVE 08/29/2018 1313   NITRITE NEGATIVE 08/29/2018 1313   LEUKOCYTESUR NEGATIVE 08/29/2018 1313   Sepsis Labs: _0 (procalcitonin:4,lacticidven:4) )No results found for this or any previous visit (from the past 240 hour(s)).   Radiological Exams on Admission: CT CHEST ABDOMEN PELVIS W CONTRAST  Result Date: 06/04/2022 CLINICAL DATA:  Fall injury on blood thinners with blunt polytrauma. EXAM: CT CHEST, ABDOMEN, AND PELVIS WITH CONTRAST TECHNIQUE: Multidetector CT imaging of the chest, abdomen and pelvis was performed following the standard protocol during bolus administration of intravenous contrast. RADIATION DOSE REDUCTION: This exam was performed according to the departmental dose-optimization program which includes automated exposure control, adjustment of the mA and/or kV according to patient size and/or use of iterative reconstruction technique. CONTRAST:  77m OMNIPAQUE IOHEXOL 350 MG/ML SOLN COMPARISON:  Portable chest today, chest radiograph 11/26/2011, and CTA chest 11/26/2011 FINDINGS: CT CHEST FINDINGS Cardiovascular: The cardiac size is normal. There is no pericardial effusion. There are trace calcifications left main, lad and circumflex coronary arteries. Pulmonary arteries and veins are normal caliber the pulmonary arteries are centrally clear. There is mild calcific atherosclerosis in the aorta and left common carotid artery. There is no aortic aneurysm, stenosis or dissection. There is tortuosity in the descending segment. Mediastinum/Nodes: Small hiatal hernia. There is mild thickening of the distal thoracic esophagus without masslike thickening. The lower poles of the thyroid gland, axillary spaces, remainder of the thoracic esophagus the thoracic trachea are unremarkable. There is a mildly elevated left hemidiaphragm. No intrathoracic adenopathy. Lungs/Pleura: No pleural effusion, thickening or  pneumothorax. Coarse atelectatic bands are again noted in the lower lobes and scattered linear scar-like opacities in the bases. There is posterior atelectasis in the upper lobes. 3 mm stable fissural nodule is again noted in the left upper lobe on 5:37, consistent with a benign intrapulmonary lymph node. Also again noted is a 3 mm chronic right middle lobe nodule laterally on 5:90. There is new demonstration of a 2 mm pleural-based right upper lobe nodule laterally on 5:67. The remainder of both lungs are clear. The central airways are clear. Musculoskeletal: No regional skeletal fracture is seen. There are degenerative  changes of thoracic spine mild osteopenia. CT ABDOMEN PELVIS FINDINGS Hepatobiliary: The liver is 19 cm in length and mildly steatotic. There are multiple scattered subcentimeter hypodensities which are too small to characterize, not seen or not included previously. In the left lobe there is a 1 cm cyst of 12.7 Hounsfield units on 3:57 and in the posterior segment of the right lobe there is a 1.2 cm cyst of -9 Hounsfield units. No hepatic injury or perihepatic hematoma is seen. The gallbladder and bile ducts are unremarkable. Pancreas: Partially atrophic, otherwise unremarkable. Spleen: Normal in size with no mass enhancement, laceration or perisplenic hematoma. Adrenals/Urinary Tract: No adrenal or perirenal hemorrhage is seen. No adrenal or renal mass enhancement. There is no urinary stone or obstruction. There is a thin walled homogeneous 2.3 cm cyst in the medial aspect of the extreme upper pole of the right kidney of 4.4 Hounsfield units. No imaging follow-up is needed. For reference seeJACR 2018 Feb; 264-273, Management of the Incidental Renal mass on CT, [employer] 2021; 814-848, Bosniak Classification of Cystic Renal Masses, Version 2019. The bladder appears normal in thickness for the degree of distention. Stomach/Bowel: No dilatation or wall thickening of the unopacified bowel including  of the appendix. Moderate stool retention. Left colonic diverticulosis without evidence of diverticulitis. Vascular/Lymphatic: Aortic atherosclerosis. No enlarged abdominal or pelvic lymph nodes. Reproductive: Enlarged prostate, measures 4.7 cm transversely with mild impression on the bladder base. There is either an ovoid homogeneous collection of 34 Hounsfield units in the right inguinal canal or the right testicle is retracted into the inguinal canal. Testicular retraction is favored. Other: There is no free air, free hemorrhage or free fluid. No incarcerated hernia. Musculoskeletal: Slight dextroscoliosis lumbar spine. Multilevel degenerative disc disease, facet hypertrophy and spondylosis. No acute regional skeletal findings. Bridging osteophytes anterior left SI joint. IMPRESSION: 1. No acute trauma related findings in the chest, abdomen or pelvis. 2. Stable tiny lung nodules and a new 2 mm right upper lobe nodule. Per Fleischner Society Guidelines, if patient is low risk for malignancy, no routine follow-up imaging is recommended. If patient is high risk for malignancy, a non-contrast Chest CT at 12 months is optional. If performed and the nodule is stable at 12 months, no further follow-up is recommended. These guidelines do not apply to immunocompromised patients and patients with cancer. Follow up in patients with significant comorbidities as clinically warranted. For lung cancer screening, adhere to Lung-RADS guidelines. Reference: Radiology. 2017; 284(1):228-43. 3. Hiatal hernia with mild thickening of the distal esophagus most likely reflux related. No masslike thickening. 4. Aortic and coronary atherosclerosis. 5. Multiple tiny hypodensities in the hepatic substance which are too small to characterize, and 2 hypodense lesions large enough to characterize measure within the cystic range. Similar findings were not seen in 2013. Recommend follow-up MRI abdomen without and with contrast 3-6 months. 6.  Prostatomegaly. 7. Right testicle appears to be retracted into the right inguinal canal. Clinical correlation. 8. Constipation with diverticulosis. 9. Osteopenia and degenerative change. Electronically Signed   By: Telford Nab M.D.   On: 06/04/2022 03:31   CT HEAD WO CONTRAST  Result Date: 06/04/2022 CLINICAL DATA:  Fall on blood thinners EXAM: CT HEAD WITHOUT CONTRAST CT CERVICAL SPINE WITHOUT CONTRAST TECHNIQUE: Multidetector CT imaging of the head and cervical spine was performed following the standard protocol without intravenous contrast. Multiplanar CT image reconstructions of the cervical spine were also generated. RADIATION DOSE REDUCTION: This exam was performed according to the departmental dose-optimization program which includes automated  exposure control, adjustment of the mA and/or kV according to patient size and/or use of iterative reconstruction technique. COMPARISON:  None Available. FINDINGS: CT HEAD FINDINGS Brain: No intracranial hemorrhage, mass effect, or evidence of acute infarct. No hydrocephalus. No extra-axial fluid collection. Generalized cerebral atrophy. Ill-defined hypoattenuation within the cerebral white matter is nonspecific but consistent with chronic small vessel ischemic disease. Vascular: No hyperdense vessel. Intracranial arterial calcification. Skull: No fracture or focal lesion. Sinuses/Orbits: No acute finding. Paranasal sinuses and mastoid air cells are well aerated. Other: None. CT CERVICAL SPINE FINDINGS Alignment: No traumatic malalignment Skull base and vertebrae: No acute fracture. No primary bone lesion or focal pathologic process. Soft tissues and spinal canal: No prevertebral fluid or swelling. No visible canal hematoma. Disc levels: Multilevel spondylosis and disc space height loss greatest at C5-T1 where it is moderate-advanced. Posterior disc osteophyte complex at C6-C7 causes mild effacement of the ventral thecal sac. No high-grade spinal canal  narrowing. Uncovertebral spurring and facet arthropathy cause advanced neural foraminal narrowing bilaterally at C3-C4 and on the right at C6-C7 and on the right at C7-T1. Upper chest: No acute abnormality. Other: None. IMPRESSION: 1. No acute intracranial abnormality. Generalized atrophy and small vessel white matter disease. 2. No acute fracture in the cervical spine. Multilevel degenerative spondylosis. Electronically Signed   By: Placido Sou M.D.   On: 06/04/2022 03:05   CT CERVICAL SPINE WO CONTRAST  Result Date: 06/04/2022 CLINICAL DATA:  Fall on blood thinners EXAM: CT HEAD WITHOUT CONTRAST CT CERVICAL SPINE WITHOUT CONTRAST TECHNIQUE: Multidetector CT imaging of the head and cervical spine was performed following the standard protocol without intravenous contrast. Multiplanar CT image reconstructions of the cervical spine were also generated. RADIATION DOSE REDUCTION: This exam was performed according to the departmental dose-optimization program which includes automated exposure control, adjustment of the mA and/or kV according to patient size and/or use of iterative reconstruction technique. COMPARISON:  None Available. FINDINGS: CT HEAD FINDINGS Brain: No intracranial hemorrhage, mass effect, or evidence of acute infarct. No hydrocephalus. No extra-axial fluid collection. Generalized cerebral atrophy. Ill-defined hypoattenuation within the cerebral white matter is nonspecific but consistent with chronic small vessel ischemic disease. Vascular: No hyperdense vessel. Intracranial arterial calcification. Skull: No fracture or focal lesion. Sinuses/Orbits: No acute finding. Paranasal sinuses and mastoid air cells are well aerated. Other: None. CT CERVICAL SPINE FINDINGS Alignment: No traumatic malalignment Skull base and vertebrae: No acute fracture. No primary bone lesion or focal pathologic process. Soft tissues and spinal canal: No prevertebral fluid or swelling. No visible canal hematoma. Disc  levels: Multilevel spondylosis and disc space height loss greatest at C5-T1 where it is moderate-advanced. Posterior disc osteophyte complex at C6-C7 causes mild effacement of the ventral thecal sac. No high-grade spinal canal narrowing. Uncovertebral spurring and facet arthropathy cause advanced neural foraminal narrowing bilaterally at C3-C4 and on the right at C6-C7 and on the right at C7-T1. Upper chest: No acute abnormality. Other: None. IMPRESSION: 1. No acute intracranial abnormality. Generalized atrophy and small vessel white matter disease. 2. No acute fracture in the cervical spine. Multilevel degenerative spondylosis. Electronically Signed   By: Placido Sou M.D.   On: 06/04/2022 03:05   DG Pelvis Portable  Result Date: 06/04/2022 CLINICAL DATA:  Fall on blood thinners EXAM: PORTABLE PELVIS 1-2 VIEWS COMPARISON:  None Available. FINDINGS: There is no evidence of pelvic fracture or diastasis. No pelvic bone lesions are seen. IMPRESSION: No acute fracture. Electronically Signed   By: Carroll Kinds.D.  On: 06/04/2022 02:28   DG Chest Port 1 View  Result Date: 06/04/2022 CLINICAL DATA:  Fall on blood thinners EXAM: PORTABLE CHEST 1 VIEW COMPARISON:  Radiographs 11/25/2021 FINDINGS: No focal consolidation, pleural effusion, or pneumothorax. Mild widening of the superior mediastinum is presumed due to supine technique however continued attention on upcoming CT chest with contrast is recommended. No displaced rib fractures. IMPRESSION: Mild widening of the superior mediastinum is presumed due to supine technique however continued attention on upcoming CT chest with contrast is recommended. Electronically Signed   By: Placido Sou M.D.   On: 06/04/2022 02:27    EKG: Independently reviewed. Sinus rhythm, 1st degree AV block.   Assessment/Plan   1. Syncope; seizure-like activity  - Presents after a syncopal episode followed by brief generalized shaking; most likely convulsive syncope,  possibly related to Viagra use  - Continue cardiac monitoring, check orthostatic vitals, echocardiogram, and EEG    2. PAF  - Continue Eliquis and metoprolol    3. CLL  - Followed by oncology, not currently on treatment   4. Liver lesions, lung nodules  - Noted incidentally on CT in ED  - Outpatient follow-up recommended     DVT prophylaxis: Eliquis  Code Status: Full  Level of Care: Level of care: Telemetry Medical Family Communication: none present  Disposition Plan:  Patient is from: home  Anticipated d/c is to: Home  Anticipated d/c date is: 06/05/22  Patient currently: Pending cardiac monitoring, orthostatic vitals, echocardiogram, EEG  Consults called: none  Admission status: Observation     Vianne Bulls, MD [facility]  06/04/2022, 4:34 AM

## 2022-06-05 DIAGNOSIS — R55 Syncope and collapse: Secondary | ICD-10-CM | POA: Diagnosis not present

## 2022-06-05 DIAGNOSIS — I480 Paroxysmal atrial fibrillation: Secondary | ICD-10-CM | POA: Diagnosis not present

## 2022-06-05 DIAGNOSIS — C9110 Chronic lymphocytic leukemia of B-cell type not having achieved remission: Secondary | ICD-10-CM | POA: Diagnosis not present

## 2022-06-05 LAB — CBC
HCT: 42.8 % (ref 39.0–52.0)
Hemoglobin: 13.2 g/dL (ref 13.0–17.0)
MCH: 30 pg (ref 26.0–34.0)
MCHC: 30.8 g/dL (ref 30.0–36.0)
MCV: 97.3 fL (ref 80.0–100.0)
Platelets: 65 10*3/uL — ABNORMAL LOW (ref 150–400)
RBC: 4.4 MIL/uL (ref 4.22–5.81)
RDW: 14.5 % (ref 11.5–15.5)
WBC: 11.7 10*3/uL — ABNORMAL HIGH (ref 4.0–10.5)
nRBC: 0.6 % — ABNORMAL HIGH (ref 0.0–0.2)

## 2022-06-05 LAB — GLUCOSE, CAPILLARY
Glucose-Capillary: 175 mg/dL — ABNORMAL HIGH (ref 70–99)
Glucose-Capillary: 156 mg/dL — ABNORMAL HIGH (ref 70–99)

## 2022-06-05 LAB — MAGNESIUM: Magnesium: 2 mg/dL (ref 1.7–2.4)

## 2022-06-05 LAB — BASIC METABOLIC PANEL
Anion gap: 11 (ref 5–15)
BUN: 12 mg/dL (ref 8–23)
CO2: 23 mmol/L (ref 22–32)
Calcium: 8.4 mg/dL — ABNORMAL LOW (ref 8.9–10.3)
Chloride: 105 mmol/L (ref 98–111)
Creatinine, Ser: 0.87 mg/dL (ref 0.61–1.24)
GFR, Estimated: >60 mL/min (ref >60)
Glucose, Bld: 192 mg/dL — ABNORMAL HIGH (ref 70–99)
Potassium: 4.1 mmol/L (ref 3.5–5.1)
Sodium: 139 mmol/L (ref 135–145)

## 2022-06-05 NOTE — Discharge Summary (Signed)
Physician Discharge Summary  Lucas [name]:[id number] DOB: 01-24-1941 DOA: 06/04/2022  PCP: Burnard Bunting, MD  Admit date: 06/04/2022 Discharge date: 06/05/2022  Admitted From: Home Disposition: Home  Recommendations for Outpatient Follow-up:  Follow up with PCP in 1 week with repeat CBC/BMP Outpatient follow-up with cardiology Follow up in ED if symptoms worsen or new appear   Home Health: No Equipment/Devices: None  Discharge Condition: Stable CODE STATUS: Full Diet recommendation: Heart healthy  Brief/Interim Summary: 81 y.o. male with medical history significant for paroxysmal atrial fibrillation on Eliquis, CLL not currently requiring treatment, and type 2 diabetes mellitus presented with lightheadedness and syncope followed by few seconds of generalized shaking.  On presentation, vitals were stable.  EKG showed sinus rhythm with first-degree AV block.  CT of the head, cervical spine were negative for acute findings. CT of the chest/abdomen/pelvis was notable for indeterminate liver hypodensities, lung nodules, prostatomegaly, and retraction of the right testis into the inguinal canal.  He was found to have elevated lactic acid, elevated WBCs and chronic thrombocytopenia.  Kcentra was IV fluids.  Subsequently, EKG was negative for seizures.  Echo showed EF of 55 to 60% with grade 1 diastolic dysfunction.  Subsequently, he has remained stable and ambulated well.  He will be discharged home today with outpatient follow-up with PCP and cardiology.  Viagra to remain on hold on discharge.  Discharge Diagnoses:   Syncope -Presented with a syncopal episode followed by brief generalized shaking, most likely convulsive syncope, possibly related to Viagra use -No further syncope since admission. -EKG was negative for seizures.  Echo showed EF of 55 to 60% with grade 1 diastolic dysfunction.  -Imaging as above. -Discharge patient home today.  Outpatient follow-up with PCP and  cardiology. -Viagra to remain on hold on discharge.  PAF -Continue Eliquis and metoprolol  CLL Chronic thrombocytopenia -Followed by oncology.  Currently not on treatment.  Liver lesions, lung nodules -Incidental finding on CT.  Outpatient follow-up recommended  Leukocytosis -Improving.  Diabetes mellitus type 2 with hyperglycemia -Carb modified diet.  Continue metformin   Discharge Instructions   Allergies as of 06/05/2022       Reactions   Nsaids Other (See Comments)   Told to avoid all NSAIDs while taking Eliquis   Shellfish Allergy Anaphylaxis, Swelling   Adhesive [tape] Rash, Other (See Comments)   Redness Irritation    Latex Rash        Medication List     STOP taking these medications    chlorpheniramine 4 MG tablet Commonly known as: CHLOR-TRIMETON   sildenafil 50 MG tablet Commonly known as: Viagra       TAKE these medications    brimonidine 0.2 % ophthalmic solution Commonly known as: ALPHAGAN Place 1 drop into the left eye 2 (two) times daily.   Eliquis 5 MG Tabs tablet Generic drug: apixaban TAKE 1 TABLET BY MOUTH TWICE  DAILY What changed: how much to take   ketoconazole 2 % cream Commonly known as: NIZORAL Apply 1 Application topically daily as needed for irritation (rash).   metFORMIN 500 MG 24 hr tablet Commonly known as: GLUCOPHAGE-XR Take 500 mg by mouth daily with supper.   metoprolol succinate 25 MG 24 hr tablet Commonly known as: TOPROL-XL TAKE 1 TABLET BY MOUTH DAILY What changed: when to take this   PROBIOTIC PO Take 1 capsule by mouth at bedtime.   timolol 0.5 % ophthalmic solution Commonly known as: TIMOPTIC Place 1 drop into the left eye daily. Left  eye         Allergies  Allergen Reactions   Nsaids Other (See Comments)    Told to avoid all NSAIDs while taking Eliquis   Shellfish Allergy Anaphylaxis and Swelling   Adhesive [Tape] Rash and Other (See Comments)    Redness Irritation    Latex Rash     Consultations: None   Procedures/Studies: ECHOCARDIOGRAM COMPLETE  Result Date: 06/04/2022    ECHOCARDIOGRAM REPORT   Patient Name:   Lucas [name] Date of Exam: 06/04/2022 Medical Rec #:  [id number]         Height:       72.0 in Accession #:    [id number]        Weight:       167.8 lb Date of Birth:  12/05/1940        BSA:          1.977 m Patient Age:    28 years          BP:           120/65 mmHg Patient Gender: M                 HR:           64 bpm. Exam Location:  Inpatient Procedure: 2D Echo, Color Doppler and Cardiac Doppler Indications:    Syncope  History:        Patient has prior history of Echocardiogram examinations, most                 recent 07/18/2013. Arrythmias:Atrial Fibrillation; Risk                 Factors:Diabetes.  Sonographer:    Memory Argue Referring Phys: [id number] [place]  1. Technically difficult due to poor sound wave transmission.  2. Left ventricular ejection fraction, by estimation, is 55 to 60%. The left ventricle has normal function. The left ventricle has no regional wall motion abnormalities. There is mild concentric left ventricular hypertrophy. Left ventricular diastolic parameters are consistent with Grade I diastolic dysfunction (impaired relaxation).  3. Right ventricular systolic function is normal. The right ventricular size is normal.  4. The mitral valve is normal in structure. No evidence of mitral valve regurgitation. No evidence of mitral stenosis.  5. The aortic valve was not well visualized. Aortic valve regurgitation is not visualized. No aortic stenosis is present.  6. Pulmonic valve regurgitation Not assessed. Comparison(s): No prior Echocardiogram. FINDINGS  Left Ventricle: Left ventricular ejection fraction, by estimation, is 55 to 60%. The left ventricle has normal function. The left ventricle has no regional wall motion abnormalities. The left ventricular internal cavity size was normal in size. There is  mild concentric  left ventricular hypertrophy. Left ventricular diastolic parameters are consistent with Grade I diastolic dysfunction (impaired relaxation). Right Ventricle: The right ventricular size is normal. Right ventricular systolic function is normal. Left Atrium: Left atrial size was normal in size. Right Atrium: Right atrial size was normal in size. Pericardium: There is no evidence of pericardial effusion. Mitral Valve: The mitral valve is normal in structure. No evidence of mitral valve regurgitation. No evidence of mitral valve stenosis. Tricuspid Valve: The tricuspid valve is not well visualized. Tricuspid valve regurgitation is not demonstrated. No evidence of tricuspid stenosis. Aortic Valve: The aortic valve was not well visualized. Aortic valve regurgitation is not visualized. No aortic stenosis is present. Aortic valve mean gradient measures 3.0 mmHg. Aortic valve peak gradient measures  4.4 mmHg. Aortic valve area, by VTI measures 2.26 cm. Pulmonic Valve: The pulmonic valve was not well visualized. Pulmonic valve regurgitation Not assessed. Aorta: The aortic root is normal in size and structure. Venous: The inferior vena cava was not well visualized. IAS/Shunts: The interatrial septum was not well visualized. Additional Comments: Technically difficult due to poor sound wave transmission.  LEFT VENTRICLE PLAX 2D LVIDd:         3.00 cm   Diastology LVIDs:         2.10 cm   LV e' medial:    5.98 cm/s LV PW:         1.20 cm   LV E/e' medial:  8.8 LV IVS:        1.20 cm   LV e' lateral:   10.20 cm/s LVOT diam:     2.00 cm   LV E/e' lateral: 5.2 LV SV:         56 LV SV Index:   28 LVOT Area:     3.14 cm  RIGHT VENTRICLE TAPSE (M-mode): 1.8 cm LEFT ATRIUM             Index        RIGHT ATRIUM           Index LA Vol (A2C):   50.6 ml 25.59 ml/m  RA Area:     10.70 cm LA Vol (A4C):   47.8 ml 24.17 ml/m  RA Volume:   21.00 ml  10.62 ml/m LA Biplane Vol: 49.6 ml 25.08 ml/m  AORTIC VALVE AV Area (Vmax):    2.32 cm AV  Area (Vmean):   2.38 cm AV Area (VTI):     2.26 cm AV Vmax:           105.00 cm/s AV Vmean:          75.500 cm/s AV VTI:            0.247 m AV Peak Grad:      4.4 mmHg AV Mean Grad:      3.0 mmHg LVOT Vmax:         77.70 cm/s LVOT Vmean:        57.100 cm/s LVOT VTI:          0.178 m LVOT/AV VTI ratio: 0.72  AORTA Ao Root diam: 3.50 cm MITRAL VALVE MV Area (PHT): 3.91 cm    SHUNTS MV Decel Time: 194 msec    Systemic VTI:  0.18 m MV E velocity: 52.70 cm/s  Systemic Diam: 2.00 cm MV A velocity: 72.00 cm/s MV E/A ratio:  0.73 Kirk Ruths MD Electronically signed by Kirk Ruths MD Signature Date/Time: 06/04/2022/12:33:11 PM    Final    EEG adult  Result Date: 06/04/2022 Lora Havens, MD     06/04/2022  8:26 AM Patient Name: Lucas [name] MRN: [id number] Epilepsy Attending: Lora Havens Referring Physician/Provider: Vianne Bulls, MD Date: 06/04/2022 Duration: 22.35 mins Patient history: 81yo M with seizure like activity. EEG to evaluate for seizure. Level of alertness: Awake, asleep AEDs during EEG study: None Technical aspects: This EEG study was done with scalp electrodes positioned according to the 10-20 International system of electrode placement. Electrical activity was reviewed with band pass filter of 1-'70Hz'$ , sensitivity of 7 uV/mm, display speed of 68m/sec with a '60Hz'$  notched filter applied as appropriate. EEG data were recorded continuously and digitally stored.  Video monitoring was available and reviewed as appropriate. Description: The posterior dominant rhythm consists of  8-9 Hz activity of moderate voltage (25-35 uV) seen predominantly in posterior head regions, symmetric and reactive to eye opening and eye closing. Sleep was characterized by vertex waves, sleep spindles (12 to 14 Hz), maximal frontocentral region. Hyperventilation and photic stimulation were not performed.   IMPRESSION: This study is within normal limits. No seizures or epileptiform discharges were seen  throughout the recording. A normal interictal EEG does not exclude the diagnosis of epilepsy. Lora Havens   CT CHEST ABDOMEN PELVIS W CONTRAST  Result Date: 06/04/2022 CLINICAL DATA:  Fall injury on blood thinners with blunt polytrauma. EXAM: CT CHEST, ABDOMEN, AND PELVIS WITH CONTRAST TECHNIQUE: Multidetector CT imaging of the chest, abdomen and pelvis was performed following the standard protocol during bolus administration of intravenous contrast. RADIATION DOSE REDUCTION: This exam was performed according to the departmental dose-optimization program which includes automated exposure control, adjustment of the mA and/or kV according to patient size and/or use of iterative reconstruction technique. CONTRAST:  83m OMNIPAQUE IOHEXOL 350 MG/ML SOLN COMPARISON:  Portable chest today, chest radiograph 11/26/2011, and CTA chest 11/26/2011 FINDINGS: CT CHEST FINDINGS Cardiovascular: The cardiac size is normal. There is no pericardial effusion. There are trace calcifications left main, lad and circumflex coronary arteries. Pulmonary arteries and veins are normal caliber the pulmonary arteries are centrally clear. There is mild calcific atherosclerosis in the aorta and left common carotid artery. There is no aortic aneurysm, stenosis or dissection. There is tortuosity in the descending segment. Mediastinum/Nodes: Small hiatal hernia. There is mild thickening of the distal thoracic esophagus without masslike thickening. The lower poles of the thyroid gland, axillary spaces, remainder of the thoracic esophagus the thoracic trachea are unremarkable. There is a mildly elevated left hemidiaphragm. No intrathoracic adenopathy. Lungs/Pleura: No pleural effusion, thickening or pneumothorax. Coarse atelectatic bands are again noted in the lower lobes and scattered linear scar-like opacities in the bases. There is posterior atelectasis in the upper lobes. 3 mm stable fissural nodule is again noted in the left upper lobe  on 5:37, consistent with a benign intrapulmonary lymph node. Also again noted is a 3 mm chronic right middle lobe nodule laterally on 5:90. There is new demonstration of a 2 mm pleural-based right upper lobe nodule laterally on 5:67. The remainder of both lungs are clear. The central airways are clear. Musculoskeletal: No regional skeletal fracture is seen. There are degenerative changes of thoracic spine mild osteopenia. CT ABDOMEN PELVIS FINDINGS Hepatobiliary: The liver is 19 cm in length and mildly steatotic. There are multiple scattered subcentimeter hypodensities which are too small to characterize, not seen or not included previously. In the left lobe there is a 1 cm cyst of 12.7 Hounsfield units on 3:57 and in the posterior segment of the right lobe there is a 1.2 cm cyst of -9 Hounsfield units. No hepatic injury or perihepatic hematoma is seen. The gallbladder and bile ducts are unremarkable. Pancreas: Partially atrophic, otherwise unremarkable. Spleen: Normal in size with no mass enhancement, laceration or perisplenic hematoma. Adrenals/Urinary Tract: No adrenal or perirenal hemorrhage is seen. No adrenal or renal mass enhancement. There is no urinary stone or obstruction. There is a thin walled homogeneous 2.3 cm cyst in the medial aspect of the extreme upper pole of the right kidney of 4.4 Hounsfield units. No imaging follow-up is needed. For reference seeJACR 2018 Feb; 264-273, Management of the Incidental Renal mass on CT, [employer] 2021; 814-848, Bosniak Classification of Cystic Renal Masses, Version 2019. The bladder appears normal in thickness for the degree  of distention. Stomach/Bowel: No dilatation or wall thickening of the unopacified bowel including of the appendix. Moderate stool retention. Left colonic diverticulosis without evidence of diverticulitis. Vascular/Lymphatic: Aortic atherosclerosis. No enlarged abdominal or pelvic lymph nodes. Reproductive: Enlarged prostate, measures 4.7 cm  transversely with mild impression on the bladder base. There is either an ovoid homogeneous collection of 34 Hounsfield units in the right inguinal canal or the right testicle is retracted into the inguinal canal. Testicular retraction is favored. Other: There is no free air, free hemorrhage or free fluid. No incarcerated hernia. Musculoskeletal: Slight dextroscoliosis lumbar spine. Multilevel degenerative disc disease, facet hypertrophy and spondylosis. No acute regional skeletal findings. Bridging osteophytes anterior left SI joint. IMPRESSION: 1. No acute trauma related findings in the chest, abdomen or pelvis. 2. Stable tiny lung nodules and a new 2 mm right upper lobe nodule. Per Fleischner Society Guidelines, if patient is low risk for malignancy, no routine follow-up imaging is recommended. If patient is high risk for malignancy, a non-contrast Chest CT at 12 months is optional. If performed and the nodule is stable at 12 months, no further follow-up is recommended. These guidelines do not apply to immunocompromised patients and patients with cancer. Follow up in patients with significant comorbidities as clinically warranted. For lung cancer screening, adhere to Lung-RADS guidelines. Reference: Radiology. 2017; 284(1):228-43. 3. Hiatal hernia with mild thickening of the distal esophagus most likely reflux related. No masslike thickening. 4. Aortic and coronary atherosclerosis. 5. Multiple tiny hypodensities in the hepatic substance which are too small to characterize, and 2 hypodense lesions large enough to characterize measure within the cystic range. Similar findings were not seen in 2013. Recommend follow-up MRI abdomen without and with contrast 3-6 months. 6. Prostatomegaly. 7. Right testicle appears to be retracted into the right inguinal canal. Clinical correlation. 8. Constipation with diverticulosis. 9. Osteopenia and degenerative change. Electronically Signed   By: Telford Nab M.D.   On:  06/04/2022 03:31   CT HEAD WO CONTRAST  Result Date: 06/04/2022 CLINICAL DATA:  Fall on blood thinners EXAM: CT HEAD WITHOUT CONTRAST CT CERVICAL SPINE WITHOUT CONTRAST TECHNIQUE: Multidetector CT imaging of the head and cervical spine was performed following the standard protocol without intravenous contrast. Multiplanar CT image reconstructions of the cervical spine were also generated. RADIATION DOSE REDUCTION: This exam was performed according to the departmental dose-optimization program which includes automated exposure control, adjustment of the mA and/or kV according to patient size and/or use of iterative reconstruction technique. COMPARISON:  None Available. FINDINGS: CT HEAD FINDINGS Brain: No intracranial hemorrhage, mass effect, or evidence of acute infarct. No hydrocephalus. No extra-axial fluid collection. Generalized cerebral atrophy. Ill-defined hypoattenuation within the cerebral white matter is nonspecific but consistent with chronic small vessel ischemic disease. Vascular: No hyperdense vessel. Intracranial arterial calcification. Skull: No fracture or focal lesion. Sinuses/Orbits: No acute finding. Paranasal sinuses and mastoid air cells are well aerated. Other: None. CT CERVICAL SPINE FINDINGS Alignment: No traumatic malalignment Skull base and vertebrae: No acute fracture. No primary bone lesion or focal pathologic process. Soft tissues and spinal canal: No prevertebral fluid or swelling. No visible canal hematoma. Disc levels: Multilevel spondylosis and disc space height loss greatest at C5-T1 where it is moderate-advanced. Posterior disc osteophyte complex at C6-C7 causes mild effacement of the ventral thecal sac. No high-grade spinal canal narrowing. Uncovertebral spurring and facet arthropathy cause advanced neural foraminal narrowing bilaterally at C3-C4 and on the right at C6-C7 and on the right at C7-T1. Upper chest: No acute abnormality.  Other: None. IMPRESSION: 1. No acute  intracranial abnormality. Generalized atrophy and small vessel white matter disease. 2. No acute fracture in the cervical spine. Multilevel degenerative spondylosis. Electronically Signed   By: Placido Sou M.D.   On: 06/04/2022 03:05   CT CERVICAL SPINE WO CONTRAST  Result Date: 06/04/2022 CLINICAL DATA:  Fall on blood thinners EXAM: CT HEAD WITHOUT CONTRAST CT CERVICAL SPINE WITHOUT CONTRAST TECHNIQUE: Multidetector CT imaging of the head and cervical spine was performed following the standard protocol without intravenous contrast. Multiplanar CT image reconstructions of the cervical spine were also generated. RADIATION DOSE REDUCTION: This exam was performed according to the departmental dose-optimization program which includes automated exposure control, adjustment of the mA and/or kV according to patient size and/or use of iterative reconstruction technique. COMPARISON:  None Available. FINDINGS: CT HEAD FINDINGS Brain: No intracranial hemorrhage, mass effect, or evidence of acute infarct. No hydrocephalus. No extra-axial fluid collection. Generalized cerebral atrophy. Ill-defined hypoattenuation within the cerebral white matter is nonspecific but consistent with chronic small vessel ischemic disease. Vascular: No hyperdense vessel. Intracranial arterial calcification. Skull: No fracture or focal lesion. Sinuses/Orbits: No acute finding. Paranasal sinuses and mastoid air cells are well aerated. Other: None. CT CERVICAL SPINE FINDINGS Alignment: No traumatic malalignment Skull base and vertebrae: No acute fracture. No primary bone lesion or focal pathologic process. Soft tissues and spinal canal: No prevertebral fluid or swelling. No visible canal hematoma. Disc levels: Multilevel spondylosis and disc space height loss greatest at C5-T1 where it is moderate-advanced. Posterior disc osteophyte complex at C6-C7 causes mild effacement of the ventral thecal sac. No high-grade spinal canal narrowing.  Uncovertebral spurring and facet arthropathy cause advanced neural foraminal narrowing bilaterally at C3-C4 and on the right at C6-C7 and on the right at C7-T1. Upper chest: No acute abnormality. Other: None. IMPRESSION: 1. No acute intracranial abnormality. Generalized atrophy and small vessel white matter disease. 2. No acute fracture in the cervical spine. Multilevel degenerative spondylosis. Electronically Signed   By: Placido Sou M.D.   On: 06/04/2022 03:05   DG Pelvis Portable  Result Date: 06/04/2022 CLINICAL DATA:  Fall on blood thinners EXAM: PORTABLE PELVIS 1-2 VIEWS COMPARISON:  None Available. FINDINGS: There is no evidence of pelvic fracture or diastasis. No pelvic bone lesions are seen. IMPRESSION: No acute fracture. Electronically Signed   By: Placido Sou M.D.   On: 06/04/2022 02:28   DG Chest Port 1 View  Result Date: 06/04/2022 CLINICAL DATA:  Fall on blood thinners EXAM: PORTABLE CHEST 1 VIEW COMPARISON:  Radiographs 11/25/2021 FINDINGS: No focal consolidation, pleural effusion, or pneumothorax. Mild widening of the superior mediastinum is presumed due to supine technique however continued attention on upcoming CT chest with contrast is recommended. No displaced rib fractures. IMPRESSION: Mild widening of the superior mediastinum is presumed due to supine technique however continued attention on upcoming CT chest with contrast is recommended. Electronically Signed   By: Placido Sou M.D.   On: 06/04/2022 02:27      Subjective: Patient seen and examined at bedside.  No seizures, fever, vomiting, syncope reported. Wants to go home today.  Discharge Exam: Vitals:   06/05/22 0236 06/05/22 0300  BP: (!) 145/81 125/76  Pulse: 62 (!) 51  Resp: 13 11  Temp: 97.9 F (36.6 C)   SpO2: 100% 100%    General: Pt is alert, awake, not in acute distress. Cardiovascular: Mild intermittent bradycardia present, S1/S2 + Respiratory: bilateral decreased breath sounds at  bases Abdominal: Soft, NT, ND,  bowel sounds + Extremities: no edema, no cyanosis    The results of significant diagnostics from this hospitalization (including imaging, microbiology, ancillary and laboratory) are listed below for reference.     Microbiology: No results found for this or any previous visit (from the past 240 hour(s)).   Labs: BNP (last 3 results) No results for input(s): "BNP" in the last 8760 hours. Basic Metabolic Panel: Recent Labs  Lab 06/04/22 0213 06/04/22 0221 06/05/22 0304  NA 135 136 139  K 4.0 3.7 4.1  CL 100 97* 105  CO2 24  --  23  GLUCOSE 197* 192* 192*  BUN '15 16 12  '$ CREATININE 1.01 0.80 0.87  CALCIUM 8.6*  --  8.4*  MG  --   --  2.0   Liver Function Tests: Recent Labs  Lab 06/04/22 0213  AST 16  ALT 12  ALKPHOS 58  BILITOT 0.5  PROT 5.6*  ALBUMIN 3.5   No results for input(s): "LIPASE", "AMYLASE" in the last 168 hours. No results for input(s): "AMMONIA" in the last 168 hours. CBC: Recent Labs  Lab 06/04/22 0213 06/04/22 0221 06/05/22 0304  WBC 14.1*  --  11.7*  HGB 13.8 15.0 13.2  HCT 42.5 44.0 42.8  MCV 95.3  --  97.3  PLT 72*  --  65*   Cardiac Enzymes: No results for input(s): "CKTOTAL", "CKMB", "CKMBINDEX", "TROPONINI" in the last 168 hours. BNP: Invalid input(s): "POCBNP" CBG: Recent Labs  Lab 06/04/22 0457 06/04/22 0903 06/04/22 1156 06/04/22 1807 06/04/22 2118  GLUCAP 195* 194* 240* 194* 248*   D-Dimer No results for input(s): "DDIMER" in the last 72 hours. Hgb A1c Recent Labs    06/04/22 0455  HGBA1C 7.6*   Lipid Profile No results for input(s): "CHOL", "HDL", "LDLCALC", "TRIG", "CHOLHDL", "LDLDIRECT" in the last 72 hours. Thyroid function studies No results for input(s): "TSH", "T4TOTAL", "T3FREE", "THYROIDAB" in the last 72 hours.  Invalid input(s): "FREET3" Anemia work up No results for input(s): "VITAMINB12", "FOLATE", "FERRITIN", "TIBC", "IRON", "RETICCTPCT" in the last 72  hours. Urinalysis    Component Value Date/Time   COLORURINE YELLOW 06/04/2022 0455   APPEARANCEUR CLEAR 06/04/2022 0455   LABSPEC >1.046 (H) 06/04/2022 0455   PHURINE 5.0 06/04/2022 0455   GLUCOSEU NEGATIVE 06/04/2022 0455   HGBUR NEGATIVE 06/04/2022 0455   BILIRUBINUR NEGATIVE 06/04/2022 0455   KETONESUR NEGATIVE 06/04/2022 0455   PROTEINUR NEGATIVE 06/04/2022 0455   NITRITE NEGATIVE 06/04/2022 0455   LEUKOCYTESUR NEGATIVE 06/04/2022 0455   Sepsis Labs Recent Labs  Lab 06/04/22 0213 06/05/22 0304  WBC 14.1* 11.7*   Microbiology No results found for this or any previous visit (from the past 240 hour(s)).   Time coordinating discharge: 35 minutes  SIGNED:   Aline August, MD  [facility] 06/05/2022, 8:09 AM

## 2022-06-07 ENCOUNTER — Encounter: Payer: Self-pay | Admitting: Hematology & Oncology

## 2022-06-08 NOTE — Progress Notes (Signed)
Cardiology Office Note:    Date:  06/09/2022   ID:  Sissy Hoff, DOB [birth year], MRN [id number]  PCP:  Burnard Bunting, [place] Providers Cardiologist:  [name], MD     Referring MD: Burnard Bunting, MD   Chief Complaint  Patient presents with   Follow-up    syncope    History of Present Illness:    Lucas [name] is a 81 y.o. male with a hx of PAF on Eliquis, CLL not currently requiring treatment, DM 2, recent syncope.  An abnormal nuclear stress test led to a diagnostic heart catheterization in 2013 that showed normal coronary arteries suggesting a false positive test.  PAF with RVR in the past with atypical chest pain.  He also has CLL which is quiescent followed by Dr. Marin Olp.  He has had several episodes of A-fib with RVR that spontaneously converted.  Many of these occurred while he was traveling abroad.  He is followed by Dr. Gwenlyn Found and was last seen 12/05/2021.  At that visit he reported having 5 episodes of A-fib over the last year all spontaneously resolving.  He continues on Eliquis for stroke prophylaxis.  He was recently hospitalized overnight 06/03/2022 after an episode of syncope felt related to Viagra use.  He felt well after using Viagra but then became acutely lightheaded while using the restroom at 1:30 AM.  He had a brief LOC with movement suspicious for seizure-like activity.  Given chronic anticoagulation with Eliquis, he underwent CT imaging that was negative for acute findings.  Echocardiogram that admission on 06/04/2022 revealed LVEF 55-60%, no RWMA, grade 1 DD, normal RV, no significant valvular disease.  He presents today for hospital follow-up. He adamantly states he was not in Afib surrounding the syncope episode. He does report having bradycardia in the ER in the 40s, a HR he has never seen on his fitbit. He was advised to stop Viagra and allergy medication. He was having issues with diarrhea on short acting metformin.  But  he has stopped this medication for 2 weeks prior to the episode.  He does state that the night of his syncopal episode he took long-acting metformin for the first time.  I do not think this is contributory.   Past Medical History:  Diagnosis Date   Atrial fibrillation ([place])    paroxysmal; Normal Coronary Arteries on Cath Jan 2013   Cataract    CLL (chronic lymphoblastic leukemia) 09/03/2011   dx 1999   Diabetes mellitus,borderline 09/10/2011   Glaucoma    Goals of care, counseling/discussion 09/17/2021    Past Surgical History:  Procedure Laterality Date   CARDIOVASCULAR STRESS TEST  09/15/2011   Mild-moderate perfusion defect due to infarct/scar w/ moderate perinfarct ischemia seen in basal inferoseptal, basal inferior, mid inferoseptal, mid inferior and apical inferior regions. Observed defect may in part be attributable to diagphragmatic attenuation, however there is also reversibility that would suggest a true defect. EKG negative for ischemia.   CATARACT EXTRACTION  2012   COLONOSCOPY     CYST REMOVAL TRUNK  2010   back   GUM SURGERY  111/2021   for a crown   LEFT HEART CATHETERIZATION WITH CORONARY ANGIOGRAM N/A 09/17/2011   Procedure: LEFT HEART CATHETERIZATION WITH CORONARY ANGIOGRAM;  Surgeon: [name], MD;  Location: [facility] CATH LAB: Normal Coronary Arteries. EF ~60%. No RWMA   ROTATOR CUFF REPAIR  1999   TONSILLECTOMY     TRANSTHORACIC ECHOCARDIOGRAM  09/15/2011; Dec 2014  EF >55%, Normal LV systolic function; EF 84-13%. GR 1 DD. No RWMA.  Borderline left atrial dilation. Otherwise normal    Current Medications: Current Meds  Medication Sig   brimonidine (ALPHAGAN) 0.2 % ophthalmic solution Place 1 drop into the left eye 2 (two) times daily.    ELIQUIS 5 MG TABS tablet TAKE 1 TABLET BY MOUTH TWICE  DAILY (Patient taking differently: Take 5 mg by mouth 2 (two) times daily.)   ketoconazole (NIZORAL) 2 % cream Apply 1 Application topically daily as needed for irritation  (rash).   metFORMIN (GLUCOPHAGE-XR) 500 MG 24 hr tablet Take 500 mg by mouth daily with supper.   metoprolol succinate (TOPROL-XL) 25 MG 24 hr tablet TAKE 1 TABLET BY MOUTH DAILY (Patient taking differently: Take 25 mg by mouth at bedtime.)   Probiotic Product (PROBIOTIC PO) Take 1 capsule by mouth at bedtime.   timolol (TIMOPTIC) 0.5 % ophthalmic solution Place 1 drop into the left eye daily. Left eye     Allergies:   Nsaids, Shellfish allergy, Adhesive [tape], and Latex   Social History   Socioeconomic History   Marital status: Married    Spouse name: Not on file   Number of children: 1   Years of education: Not on file   Highest education level: Not on file  Occupational History    Employer: RETIRED  Tobacco Use   Smoking status: Former    Packs/day: 1.00    Years: 3.00    Total pack years: 3.00    Types: Cigarettes    Start date: 09/19/1962    Quit date: 12/17/1965    Years since quitting: 56.5   Smokeless tobacco: Never   Tobacco comments:    quit 80 yeras ago  Vaping Use   Vaping Use: Never used  Substance and Sexual Activity   Alcohol use: Yes    Alcohol/week: 4.0 standard drinks of alcohol    Types: 4 Glasses of wine per week   Drug use: No   Sexual activity: Yes  Other Topics Concern   Not on file  Social History Narrative   Married Caucasian male, father to 1 stepchild, grandfather to 2 step-grandchildren.   Lives with his wife.   Travels a lot & enjoys photography.   Social Determinants of Health   Financial Resource Strain: Not on file  Food Insecurity: Not on file  Transportation Needs: Not on file  Physical Activity: Not on file  Stress: Not on file  Social Connections: Not on file     Family History: The patient's family history includes Cancer in his mother, paternal grandmother, and sister; Colon cancer in his mother; Heart attack in his father and paternal grandmother; Stroke in his father and paternal grandfather. There is no history of  Esophageal cancer, Rectal cancer, or Stomach cancer.  ROS:   Please see the history of present illness.     All other systems reviewed and are negative.  EKGs/Labs/Other Studies Reviewed:    The following studies were reviewed today:  Echo 06/04/22:  1. Technically difficult due to poor sound wave transmission.   2. Left ventricular ejection fraction, by estimation, is 55 to 60%. The  left ventricle has normal function. The left ventricle has no regional  wall motion abnormalities. There is mild concentric left ventricular  hypertrophy. Left ventricular diastolic  parameters are consistent with Grade I diastolic dysfunction (impaired  relaxation).   3. Right ventricular systolic function is normal. The right ventricular  size is normal.  4. The mitral valve is normal in structure. No evidence of mitral valve  regurgitation. No evidence of mitral stenosis.   5. The aortic valve was not well visualized. Aortic valve regurgitation  is not visualized. No aortic stenosis is present.   6. Pulmonic valve regurgitation Not assessed.   EKG:  EKG is  ordered today.  The ekg ordered today demonstrates 64  Recent Labs: 06/04/2022: ALT 12 06/05/2022: BUN 12; Creatinine, Ser 0.87; Hemoglobin 13.2; Magnesium 2.0; Platelets 65; Potassium 4.1; Sodium 139  Recent Lipid Panel    Component Value Date/Time   CHOL 168 09/10/2011 0420   TRIG 84 09/10/2011 0420   HDL 44 09/10/2011 0420   CHOLHDL 3.8 09/10/2011 0420   VLDL 17 09/10/2011 0420   LDLCALC 107 (H) 09/10/2011 0420     Risk Assessment/Calculations:                Physical Exam:    VS:  BP 112/68 (BP Location: Left Arm, Patient Position: Sitting)   Pulse 64   Wt 167 lb 12.8 oz (76.1 kg)   SpO2 94%   BMI 22.76 kg/m     Wt Readings from Last 3 Encounters:  06/09/22 167 lb 12.8 oz (76.1 kg)  06/04/22 164 lb (74.4 kg)  04/24/22 167 lb 12.8 oz (76.1 kg)     GEN:  Well nourished, well developed in no acute distress HEENT:  Normal NECK: No JVD; No carotid bruits LYMPHATICS: No lymphadenopathy CARDIAC: RRR, no murmurs, rubs, gallops RESPIRATORY:  Clear to auscultation without rales, wheezing or rhonchi  ABDOMEN: Soft, non-tender, non-distended MUSCULOSKELETAL:  No edema; No deformity  SKIN: Warm and dry NEUROLOGIC:  Alert and oriented x 3 PSYCHIATRIC:  Normal affect   ASSESSMENT:    1. Syncope and collapse   2. Paroxysmal atrial fibrillation (HCC)   3. Chronic anticoagulation    PLAN:    In order of problems listed above:  PAF with RVR Anticoagulation He has not had recent Afib and denies Afib as the etiology of his syncope.  He is in normal sinus today.  No bleeding issues on anticoagulation.   Recent syncope EEG unrevealing Echocardiogram unrevealing We will place a 30-day monitor EDP suspected viagara, but this was not his first use. While it is entirely possible he had a transient drop in BP from Viagra causing his syncopal episode, I think we need to rule out pauses and high grade block.  Have advised him to not drive at least until we have monitor results back. He stated he was not told to drive.  I strongly advised no driving according to [place] DOT.  I did discuss the [place] DMV medical guidelines for driving: "it is prudent to recommend that all persons should be free of syncopal episodes for at least six months to be granted the driving privilege." ([place], Second Edition, Medical Review Branch, Engineer, site, Division of [employer] of Transportation, July 2004)   Follow up in 6 weeks with Dr. Gwenlyn Found.     Medication Adjustments/Labs and Tests Ordered: Current medicines are reviewed at length with the patient today.  Concerns regarding medicines are outlined above.  Orders Placed This Encounter  Procedures   CARDIAC EVENT MONITOR   EKG 12-Lead   No orders of the defined types were placed in  this encounter.   Patient Instructions  Medication Instructions:  No Changes *If you need a refill on your cardiac medications before  your next appointment, please call your pharmacy*   Lab Work: No Labs If you have labs (blood work) drawn today and your tests are completely normal, you will receive your results only by: [place] (if you have MyChart) OR A paper copy in the mail If you have any lab test that is abnormal or we need to change your treatment, we will call you to review the results.   Testing/Procedures: [employer] Cardiac Event Monitor Instructions Your physician has requested you wear your cardiac event monitor for __30___ days, (1-30). [employer] may call or text to confirm a shipping address. The monitor will be sent to a land address via UPS. [employer] will not ship a monitor to a PO BOX. It typically takes 3-5 days to receive your monitor after it has been enrolled. [employer] will assist with USPS tracking if your package is delayed. The telephone number for [employer] is [phone]. Once you have received your monitor, please review the enclosed instructions. Instruction tutorials can also be viewed under help and settings on the enclosed cell phone. Your monitor has already been registered assigning a specific monitor serial # to you.  Applying the monitor Remove cell phone from case and turn it on. The cell phone works as Dealer and needs to be within [employer] of you at all times. The cell phone will need to be charged on a daily basis. We recommend you plug the cell phone into the enclosed charger at your bedside table every night.  Monitor batteries: You will receive two monitor batteries labelled #1 and #2. These are your recorders. Plug battery #2 onto the second connection on the enclosed charger. Keep one battery on the charger at all times. This will keep the monitor battery deactivated. It will also keep it fully charged for when  you need to switch your monitor batteries. A small light will be blinking on the battery emblem when it is charging. The light on the battery emblem will remain on when the battery is fully charged.  Open package of a Monitor strip. Insert battery #1 into black hood on strip and gently squeeze monitor battery onto connection as indicated in instruction booklet. Set aside while preparing skin.  Choose location for your strip, vertical or horizontal, as indicated in the instruction booklet. Shave to remove all hair from location. There cannot be any lotions, oils, powders, or colognes on skin where monitor is to be applied. Wipe skin clean with enclosed Saline wipe. Dry skin completely.  Peel paper labeled #1 off the back of the Monitor strip exposing the adhesive. Place the monitor on the chest in the vertical or horizontal position shown in the instruction booklet. One arrow on the monitor strip must be pointing upward. Carefully remove paper labeled #2, attaching remainder of strip to your skin. Try not to create any folds or wrinkles in the strip as you apply it.  Firmly press and release the circle in the center of the monitor battery. You will hear a small beep. This is turning the monitor battery on. The heart emblem on the monitor battery will light up every 5 seconds if the monitor battery in turned on and connected to the patient securely. Do not push and hold the circle down as this turns the monitor battery off. The cell phone will locate the monitor battery. A screen will appear on the cell phone checking the connection of your monitor strip. This may read poor connection initially but change to good connection within  the next minute. Once your monitor accepts the connection you will hear a series of 3 beeps followed by a climbing crescendo of beeps. A screen will appear on the cell phone showing the two monitor strip placement options. Touch the picture that demonstrates where you  applied the monitor strip.  Your monitor strip and battery are waterproof. You are able to shower, bathe, or swim with the monitor on. They just ask you do not submerge deeper than 3 feet underwater. We recommend removing the monitor if you are swimming in a lake, river, or ocean.  Your monitor battery will need to be switched to a fully charged monitor battery approximately once a week. The cell phone will alert you of an action which needs to be made.  On the cell phone, tap for details to reveal connection status, monitor battery status, and cell phone battery status. The green dots indicates your monitor is in good status. A red dot indicates there is something that needs your attention.  To record a symptom, click the circle on the monitor battery. In 30-60 seconds a list of symptoms will appear on the cell phone. Select your symptom and tap save. Your monitor will record a sustained or significant arrhythmia regardless of you clicking the button. Some patients do not feel the heart rhythm irregularities. [employer] will notify us of any serious or critical events.  Refer to instruction booklet for instructions on switching batteries, changing strips, the Do not disturb or Pause features, or any additional questions.  Call [employer] at [phone], to confirm your monitor is transmitting and record your baseline. They will answer any questions you may have regarding the monitor instructions at that time.  Returning the monitor to [place] all equipment back into blue box. Peel off strip of paper to expose adhesive and close box securely. There is a prepaid UPS shipping label on this box. Drop in a UPS drop box, or at a UPS facility like [employer]. You may also contact [employer] to arrange UPS to pick up monitor package at your home.    Follow-Up: At [facility], you and your health needs are our priority.  As part of our continuing mission to provide you with  exceptional heart care, we have created designated Provider Care Teams.  These Care Teams include your primary Cardiologist (physician) and Advanced Practice Providers (APPs -  Physician Assistants and Nurse Practitioners) who all work together to provide you with the care you need, when you need it.  We recommend signing up for the patient portal called "MyChart".  Sign up information is provided on this After Visit Summary.  MyChart is used to connect with patients for Virtual Visits (Telemedicine).  Patients are able to view lab/test results, encounter notes, upcoming appointments, etc.  Non-urgent messages can be sent to your provider as well.   To learn more about what you can do with MyChart, go to [email or web address].    Your next appointment:   6 week(s)  The format for your next appointment:   In Person  Provider:   Fabian Sharp, PA-C    or , [name], MD    Signed, [place], [place]  06/09/2022 4:06 PM    [place]

## 2022-06-09 ENCOUNTER — Encounter: Payer: Self-pay | Admitting: Physician Assistant

## 2022-06-09 ENCOUNTER — Encounter: Payer: Self-pay | Admitting: *Deleted

## 2022-06-09 ENCOUNTER — Ambulatory Visit: Payer: Medicare Other | Attending: Physician Assistant | Admitting: Physician Assistant

## 2022-06-09 VITALS — BP 112/68 | HR 64 | Wt 167.8 lb

## 2022-06-09 DIAGNOSIS — R55 Syncope and collapse: Secondary | ICD-10-CM | POA: Diagnosis not present

## 2022-06-09 DIAGNOSIS — Z7901 Long term (current) use of anticoagulants: Secondary | ICD-10-CM

## 2022-06-09 DIAGNOSIS — I480 Paroxysmal atrial fibrillation: Secondary | ICD-10-CM

## 2022-06-09 NOTE — Patient Instructions (Signed)
Medication Instructions:  No Changes *If you need a refill on your cardiac medications before your next appointment, please call your pharmacy*   Lab Work: No Labs If you have labs (blood work) drawn today and your tests are completely normal, you will receive your results only by: [place] (if you have MyChart) OR A paper copy in the mail If you have any lab test that is abnormal or we need to change your treatment, we will call you to review the results.   Testing/Procedures: [employer] Cardiac Event Monitor Instructions Your physician has requested you wear your cardiac event monitor for __30___ days, (1-30). [employer] may call or text to confirm a shipping address. The monitor will be sent to a land address via UPS. [employer] will not ship a monitor to a PO BOX. It typically takes 3-5 days to receive your monitor after it has been enrolled. [employer] will assist with USPS tracking if your package is delayed. The telephone number for [employer] is [phone]. Once you have received your monitor, please review the enclosed instructions. Instruction tutorials can also be viewed under help and settings on the enclosed cell phone. Your monitor has already been registered assigning a specific monitor serial # to you.  Applying the monitor Remove cell phone from case and turn it on. The cell phone works as Dealer and needs to be within [employer] of you at all times. The cell phone will need to be charged on a daily basis. We recommend you plug the cell phone into the enclosed charger at your bedside table every night.  Monitor batteries: You will receive two monitor batteries labelled #1 and #2. These are your recorders. Plug battery #2 onto the second connection on the enclosed charger. Keep one battery on the charger at all times. This will keep the monitor battery deactivated. It will also keep it fully charged for when you need to switch your monitor batteries.  A small light will be blinking on the battery emblem when it is charging. The light on the battery emblem will remain on when the battery is fully charged.  Open package of a Monitor strip. Insert battery #1 into black hood on strip and gently squeeze monitor battery onto connection as indicated in instruction booklet. Set aside while preparing skin.  Choose location for your strip, vertical or horizontal, as indicated in the instruction booklet. Shave to remove all hair from location. There cannot be any lotions, oils, powders, or colognes on skin where monitor is to be applied. Wipe skin clean with enclosed Saline wipe. Dry skin completely.  Peel paper labeled #1 off the back of the Monitor strip exposing the adhesive. Place the monitor on the chest in the vertical or horizontal position shown in the instruction booklet. One arrow on the monitor strip must be pointing upward. Carefully remove paper labeled #2, attaching remainder of strip to your skin. Try not to create any folds or wrinkles in the strip as you apply it.  Firmly press and release the circle in the center of the monitor battery. You will hear a small beep. This is turning the monitor battery on. The heart emblem on the monitor battery will light up every 5 seconds if the monitor battery in turned on and connected to the patient securely. Do not push and hold the circle down as this turns the monitor battery off. The cell phone will locate the monitor battery. A screen will appear on the cell phone checking the connection of  your monitor strip. This may read poor connection initially but change to good connection within the next minute. Once your monitor accepts the connection you will hear a series of 3 beeps followed by a climbing crescendo of beeps. A screen will appear on the cell phone showing the two monitor strip placement options. Touch the picture that demonstrates where you applied the monitor strip.  Your monitor  strip and battery are waterproof. You are able to shower, bathe, or swim with the monitor on. They just ask you do not submerge deeper than 3 feet underwater. We recommend removing the monitor if you are swimming in a lake, river, or ocean.  Your monitor battery will need to be switched to a fully charged monitor battery approximately once a week. The cell phone will alert you of an action which needs to be made.  On the cell phone, tap for details to reveal connection status, monitor battery status, and cell phone battery status. The green dots indicates your monitor is in good status. A red dot indicates there is something that needs your attention.  To record a symptom, click the circle on the monitor battery. In 30-60 seconds a list of symptoms will appear on the cell phone. Select your symptom and tap save. Your monitor will record a sustained or significant arrhythmia regardless of you clicking the button. Some patients do not feel the heart rhythm irregularities. [employer] will notify us of any serious or critical events.  Refer to instruction booklet for instructions on switching batteries, changing strips, the Do not disturb or Pause features, or any additional questions.  Call [employer] at [phone], to confirm your monitor is transmitting and record your baseline. They will answer any questions you may have regarding the monitor instructions at that time.  Returning the monitor to [place] all equipment back into blue box. Peel off strip of paper to expose adhesive and close box securely. There is a prepaid UPS shipping label on this box. Drop in a UPS drop box, or at a UPS facility like [employer]. You may also contact [employer] to arrange UPS to pick up monitor package at your home.    Follow-Up: At [facility], you and your health needs are our priority.  As part of our continuing mission to provide you with exceptional heart care, we have created  designated Provider Care Teams.  These Care Teams include your primary Cardiologist (physician) and Advanced Practice Providers (APPs -  Physician Assistants and Nurse Practitioners) who all work together to provide you with the care you need, when you need it.  We recommend signing up for the patient portal called "MyChart".  Sign up information is provided on this After Visit Summary.  MyChart is used to connect with patients for Virtual Visits (Telemedicine).  Patients are able to view lab/test results, encounter notes, upcoming appointments, etc.  Non-urgent messages can be sent to your provider as well.   To learn more about what you can do with MyChart, go to [email or web address].    Your next appointment:   6 week(s)  The format for your next appointment:   In Person  Provider:   Fabian Sharp, PA-C    or , [name], MD

## 2022-06-09 NOTE — Progress Notes (Signed)
Patient ID: Lucas [name], male   DOB: 14-Sep-1940, 81 y.o.   MRN: [id number] Patient enrolled for [employer] to ship a 30 day cardiac event monitor to his address on file.

## 2022-06-11 ENCOUNTER — Telehealth: Payer: Self-pay

## 2022-06-11 ENCOUNTER — Ambulatory Visit: Payer: Medicare Other | Admitting: Physician Assistant

## 2022-06-11 NOTE — Telephone Encounter (Signed)
Spoke with pt regarding change in appointment to Dr. Gwenlyn Found from Fabian Sharp, [place]. Pt is agreeable to this. Appointment made for pt on 12/15 to review pt's monitor, etc. Pt verbalizes understanding.

## 2022-06-15 ENCOUNTER — Ambulatory Visit: Payer: Medicare Other | Attending: Physician Assistant

## 2022-06-15 DIAGNOSIS — R55 Syncope and collapse: Secondary | ICD-10-CM | POA: Diagnosis not present

## 2022-06-16 ENCOUNTER — Telehealth: Payer: Self-pay | Admitting: Cardiovascular Disease

## 2022-06-16 DIAGNOSIS — I480 Paroxysmal atrial fibrillation: Secondary | ICD-10-CM

## 2022-06-16 NOTE — Telephone Encounter (Signed)
Calling with a critical report  

## 2022-06-16 NOTE — Telephone Encounter (Signed)
Received a call from [facility] with [employer] calling to report at 10:02 am this morning EKG revealed Afib RVR rate 176.Patient complained of light headed.Message and EKG strip sent to Dr.Berry's RN.

## 2022-06-17 NOTE — Telephone Encounter (Signed)
Lucas Harp, MD  You     Refer to A-fib clinic    Spoke with pt regarding Dr. Gwenlyn Found recommendations. Pt is ok with placing referral over to A-fib clinic. Orders placed. Pt verbalizes understanding.

## 2022-06-23 ENCOUNTER — Encounter: Payer: Self-pay | Admitting: Cardiovascular Disease

## 2022-06-29 ENCOUNTER — Ambulatory Visit (HOSPITAL_COMMUNITY)
Admission: RE | Admit: 2022-06-29 | Discharge: 2022-06-29 | Disposition: A | Discharge status: Home / Self Care | Payer: Medicare Other | Source: Ambulatory Visit | Attending: Physician Assistant | Admitting: Physician Assistant

## 2022-06-29 ENCOUNTER — Encounter (HOSPITAL_COMMUNITY): Payer: Self-pay | Admitting: Physician Assistant

## 2022-06-29 VITALS — BP 118/78 | HR 64 | Ht 72.0 in | Wt 167.6 lb

## 2022-06-29 DIAGNOSIS — D6869 Other thrombophilia: Secondary | ICD-10-CM | POA: Insufficient documentation

## 2022-06-29 DIAGNOSIS — Z7984 Long term (current) use of oral hypoglycemic drugs: Secondary | ICD-10-CM | POA: Insufficient documentation

## 2022-06-29 DIAGNOSIS — Z7901 Long term (current) use of anticoagulants: Secondary | ICD-10-CM | POA: Insufficient documentation

## 2022-06-29 DIAGNOSIS — I480 Paroxysmal atrial fibrillation: Secondary | ICD-10-CM

## 2022-06-29 DIAGNOSIS — Z8249 Family history of ischemic heart disease and other diseases of the circulatory system: Secondary | ICD-10-CM | POA: Insufficient documentation

## 2022-06-29 DIAGNOSIS — E119 Type 2 diabetes mellitus without complications: Secondary | ICD-10-CM | POA: Diagnosis not present

## 2022-06-29 DIAGNOSIS — Z856 Personal history of leukemia: Secondary | ICD-10-CM | POA: Insufficient documentation

## 2022-06-29 DIAGNOSIS — Z87891 Personal history of nicotine dependence: Secondary | ICD-10-CM | POA: Insufficient documentation

## 2022-06-29 DIAGNOSIS — Z79899 Other long term (current) drug therapy: Secondary | ICD-10-CM | POA: Diagnosis not present

## 2022-06-29 MED ORDER — DILTIAZEM HCL 30 MG PO TABS: ORAL_TABLET | ORAL | 1 refills | Status: DC

## 2022-06-29 NOTE — Progress Notes (Signed)
Primary Care Physician: Burnard Bunting, MD Primary Cardiologist: Dr Gwenlyn Found Primary Electrophysiologist: none Referring Physician: Dr Enrigue Catena is a 81 y.o. male with a history of CLL, DM, atrial fibrillation who presents for consultation in the [facility].  Patient is on Eliquis for a CHADS2VASC score of 3. He was hospitalized overnight 06/03/2022 after an episode of syncope felt related to Viagra use.  He felt well after using Viagra but then became acutely lightheaded while using the restroom at 1:30 AM.  He had a brief LOC with movement suspicious for seizure-like activity.  Given chronic anticoagulation with Eliquis, he underwent CT imaging that was negative for acute findings.  Echocardiogram that admission on 06/04/2022 revealed LVEF 55-60%, no RWMA, grade 1 DD, normal RV, no significant valvular disease. He had a 30 day event monitor placed which showed an episode of rapid afib with heart rate 170s. He was symptomatic with palpitations and lightheadedness. Over the past two years, he has had 4-5 afib episodes each year that can last 3-4 hours. Travel and dehydration seem to be a trigger for him. He denies any bleeding issues on anticoagulation.   Today, he denies symptoms of chest pain, shortness of breath, orthopnea, PND, lower extremity edema, presyncope, syncope, snoring, daytime somnolence, bleeding, or neurologic sequela. The patient is tolerating medications without difficulties and is otherwise without complaint today.    Atrial Fibrillation Risk Factors:  he does not have symptoms or diagnosis of sleep apnea. he does not have a history of rheumatic fever.   he has a BMI of Body mass index is 22.73 kg/m.Marland Kitchen Filed Weights   06/29/22 0924  Weight: 76 kg    Family History  Problem Relation Age of Onset   Cancer Mother        Multiple myeloma   Colon cancer Mother    Heart attack Father        Died age 55   Stroke Father     Cancer Sister        Breast cancer   Heart attack Paternal Grandmother    Cancer Paternal Grandmother        Breast cancer   Stroke Paternal Grandfather    Esophageal cancer Neg Hx    Rectal cancer Neg Hx    Stomach cancer Neg Hx      Atrial Fibrillation Management history:  Previous antiarrhythmic drugs: none Previous cardioversions: none Previous ablations: none CHADS2VASC score: 3 Anticoagulation history: Eliquis   Past Medical History:  Diagnosis Date   Atrial fibrillation ([place])    paroxysmal; Normal Coronary Arteries on Cath Jan 2013   Cataract    CLL (chronic lymphoblastic leukemia) 09/03/2011   dx 1999   Diabetes mellitus,borderline 09/10/2011   Glaucoma    Goals of care, counseling/discussion 09/17/2021   Past Surgical History:  Procedure Laterality Date   CARDIOVASCULAR STRESS TEST  09/15/2011   Mild-moderate perfusion defect due to infarct/scar w/ moderate perinfarct ischemia seen in basal inferoseptal, basal inferior, mid inferoseptal, mid inferior and apical inferior regions. Observed defect may in part be attributable to diagphragmatic attenuation, however there is also reversibility that would suggest a true defect. EKG negative for ischemia.   CATARACT EXTRACTION  2012   COLONOSCOPY     CYST REMOVAL TRUNK  2010   back   GUM SURGERY  111/2021   for a crown   LEFT HEART CATHETERIZATION WITH CORONARY ANGIOGRAM N/A 09/17/2011   Procedure: LEFT HEART CATHETERIZATION WITH CORONARY  ANGIOGRAM;  Surgeon: [name], MD;  Location: [facility] CATH LAB: Normal Coronary Arteries. EF ~60%. No RWMA   ROTATOR CUFF REPAIR  1999   TONSILLECTOMY     TRANSTHORACIC ECHOCARDIOGRAM  09/15/2011; Dec 2014   EF >55%, Normal LV systolic function; EF 20-94%. GR 1 DD. No RWMA.  Borderline left atrial dilation. Otherwise normal    Current Outpatient Medications  Medication Sig Dispense Refill   brimonidine (ALPHAGAN) 0.2 % ophthalmic solution Place 1 drop into the left eye 2 (two) times  daily.      diltiazem (CARDIZEM) 30 MG tablet Take 1 tablet every 4 hours AS NEEDED for AFIB heart rate >100 as long as top BP >100. 30 tablet 1   ELIQUIS 5 MG TABS tablet TAKE 1 TABLET BY MOUTH TWICE  DAILY 180 tablet 3   ketoconazole (NIZORAL) 2 % cream Apply 1 Application topically daily as needed for irritation (rash).     levothyroxine (SYNTHROID) 25 MCG tablet 1 tablet in the morning on an empty stomach Orally Once a day for 90 days     metFORMIN (GLUCOPHAGE-XR) 500 MG 24 hr tablet Take 500 mg by mouth daily with supper.     metoprolol succinate (TOPROL-XL) 25 MG 24 hr tablet TAKE 1 TABLET BY MOUTH DAILY 90 tablet 3   Probiotic Product (PROBIOTIC PO) Take 1 capsule by mouth at bedtime.     timolol (TIMOPTIC) 0.5 % ophthalmic solution Place 1 drop into the left eye daily. Left eye     No current facility-administered medications for this encounter.    Allergies  Allergen Reactions   Nsaids Other (See Comments)    Told to avoid all NSAIDs while taking Eliquis   Shellfish Allergy Anaphylaxis and Swelling   Adhesive [Tape] Rash and Other (See Comments)    Redness Irritation    Latex Rash    Social History   Socioeconomic History   Marital status: Married    Spouse name: Not on file   Number of children: 1   Years of education: Not on file   Highest education level: Not on file  Occupational History    Employer: RETIRED  Tobacco Use   Smoking status: Former    Packs/day: 1.00    Years: 3.00    Total pack years: 3.00    Types: Cigarettes    Start date: 09/19/1962    Quit date: 12/17/1965    Years since quitting: 56.5   Smokeless tobacco: Never   Tobacco comments:    quit 64 yeras ago  Vaping Use   Vaping Use: Never used  Substance and Sexual Activity   Alcohol use: Yes    Alcohol/week: 5.0 standard drinks of alcohol    Types: 5 Glasses of wine per week    Comment: 5 glasses of wine weekly 06/29/22   Drug use: No   Sexual activity: Yes  Other Topics Concern   Not on  file  Social History Narrative   Married Caucasian male, father to 1 stepchild, grandfather to 2 step-grandchildren.   Lives with his wife.   Travels a lot & enjoys photography.   Social Determinants of Health   Financial Resource Strain: Not on file  Food Insecurity: Not on file  Transportation Needs: Not on file  Physical Activity: Not on file  Stress: Not on file  Social Connections: Not on file  Intimate Partner Violence: Not on file     ROS- All systems are reviewed and negative except as per the HPI  above.  Physical Exam: Vitals:   06/29/22 0924  BP: 118/78  Pulse: 64  Weight: 76 kg  Height: 6' (1.829 m)    GEN- The patient is a well appearing elderly male, alert and oriented x 3 today.   Head- normocephalic, atraumatic Eyes-  Sclera clear, conjunctiva pink Ears- hearing intact Oropharynx- clear Neck- supple  Lungs- Clear to ausculation bilaterally, normal work of breathing Heart- Regular rate and rhythm, no murmurs, rubs or gallops  GI- soft, NT, ND, + BS Extremities- no clubbing, cyanosis, or edema MS- no significant deformity or atrophy Skin- no rash or lesion Psych- euthymic mood, full affect Neuro- strength and sensation are intact  Wt Readings from Last 3 Encounters:  06/29/22 76 kg  06/09/22 76.1 kg  06/04/22 74.4 kg    EKG today demonstrates  SR Vent. rate 64 BPM PR interval 190 ms QRS duration 92 ms QT/QTcB 394/406 ms  Echo 06/04/22 demonstrated   1. Technically difficult due to poor sound wave transmission.   2. Left ventricular ejection fraction, by estimation, is 55 to 60%. The  left ventricle has normal function. The left ventricle has no regional  wall motion abnormalities. There is mild concentric left ventricular  hypertrophy. Left ventricular diastolic parameters are consistent with Grade I diastolic dysfunction (impaired relaxation).   3. Right ventricular systolic function is normal. The right ventricular  size is normal.   4.  The mitral valve is normal in structure. No evidence of mitral valve  regurgitation. No evidence of mitral stenosis.   5. The aortic valve was not well visualized. Aortic valve regurgitation  is not visualized. No aortic stenosis is present.   6. Pulmonic valve regurgitation Not assessed.   Epic records are reviewed at length today  CHA2DS2-VASc Score = 3  The patient's score is based upon: CHF History: 0 HTN History: 0 Diabetes History: 1 Stroke History: 0 Vascular Disease History: 0 Age Score: 2 Gender Score: 0       ASSESSMENT AND PLAN: 1. Paroxysmal Atrial Fibrillation (ICD10:  I48.0) The patient's CHA2DS2-VASc score is 3, indicating a 3.2% annual risk of stroke.   Patient having 4-5 episodes of symptomatic, rapid afib yearly.  We discussed rhythm control options today. Will not increase BB given baseline heart rates at home tends to be in mid 50s.  Will start diltiazem 30 mg PRN q 4 hours for heart racing. We discussed AAD and ablation. Patient agreeable to EP referrao to discuss ablation.  Continue Toprol 25 mg daily Continue Eliquis 5 mg BID Will see afib burden on 30 day event monitor.   2. Secondary Hypercoagulable State (ICD10:  D68.69) The patient is at significant risk for stroke/thromboembolism based upon his CHA2DS2-VASc Score of 3.  Continue Apixaban (Eliquis).     Follow up with Dr Gwenlyn Found as scheduled. Refer to EP to discuss ablation.    [facility] [address] [place], [place] [address] [phone] 06/29/2022 10:03 AM

## 2022-07-06 ENCOUNTER — Other Ambulatory Visit (HOSPITAL_BASED_OUTPATIENT_CLINIC_OR_DEPARTMENT_OTHER): Payer: Self-pay

## 2022-07-06 MED ORDER — COMIRNATY 30 MCG/0.3ML IM SUSY
PREFILLED_SYRINGE | INTRAMUSCULAR | 0 refills | Status: DC
  Filled 2022-07-06: qty 0.3, 1d supply, fill #0

## 2022-07-27 ENCOUNTER — Ambulatory Visit: Payer: Medicare Other | Admitting: Physician Assistant

## 2022-07-31 ENCOUNTER — Encounter: Payer: Self-pay | Admitting: Cardiovascular Disease

## 2022-07-31 ENCOUNTER — Ambulatory Visit: Payer: Medicare Other | Attending: Physician Assistant | Admitting: Cardiovascular Disease

## 2022-07-31 ENCOUNTER — Ambulatory Visit: Payer: Medicare Other | Attending: Cardiovascular Disease | Admitting: Cardiovascular Disease

## 2022-07-31 VITALS — BP 134/72 | HR 75 | Ht 72.0 in | Wt 171.8 lb

## 2022-07-31 VITALS — BP 128/68 | HR 65 | Ht 72.0 in | Wt 172.0 lb

## 2022-07-31 DIAGNOSIS — I480 Paroxysmal atrial fibrillation: Secondary | ICD-10-CM

## 2022-07-31 DIAGNOSIS — R55 Syncope and collapse: Secondary | ICD-10-CM

## 2022-07-31 NOTE — Assessment & Plan Note (Signed)
Recent episode of syncope seen in the emergency room which time his heart rate was in the 40s.  Was question whether this was related to Viagra which she has been told to stop.  He did wear an event monitor that showed A-fib with RVR.  This may have been the cause.  Scheduled to see EP this afternoon to further evaluate.

## 2022-07-31 NOTE — Patient Instructions (Signed)
Medication Instructions:  Your physician recommends that you continue on your current medications as directed. Please refer to the Current Medication list given to you today.  *If you need a refill on your cardiac medications before your next appointment, please call your pharmacy*   Follow-Up: At [facility], you and your health needs are our priority.  As part of our continuing mission to provide you with exceptional heart care, we have created designated Provider Care Teams.  These Care Teams include your primary Cardiologist (physician) and Advanced Practice Providers (APPs -  Physician Assistants and Nurse Practitioners) who all work together to provide you with the care you need, when you need it.  We recommend signing up for the patient portal called "MyChart".  Sign up information is provided on this After Visit Summary.  MyChart is used to connect with patients for Virtual Visits (Telemedicine).  Patients are able to view lab/test results, encounter notes, upcoming appointments, etc.  Non-urgent messages can be sent to your provider as well.   To learn more about what you can do with MyChart, go to [email or web address].    Your next appointment:   6 month(s)  The format for your next appointment:   In Person  Provider:   Jonathan Berry, MD  

## 2022-07-31 NOTE — Addendum Note (Signed)
Addended by: Bernestine Amass on: 07/31/2022 03:35 PM   Modules accepted: Orders

## 2022-07-31 NOTE — Assessment & Plan Note (Signed)
History of PAF on Eliquis.  Has had multiple episodes of dizziness.  He recently had an event monitor placed that showed episodes of A-fib with RVR.  He is on low-dose beta-blocker and was seen in the A-fib clinic and put on as needed diltiazem for rapid heart rate.  Scheduled to see Dr. Myles Gip , EP, later this afternoon to discuss antiarrhythmic therapy, ablation versus continued medical care.

## 2022-07-31 NOTE — Progress Notes (Signed)
07/31/2022 Lucas [name]   1941-04-05  [id number]  Primary Physician Burnard Bunting, MD Primary Cardiologist: [name] MD FACP, [place], [place], [place]  HPI:  Lucas [name] is a 81 y.o.   thin and fit-appearing, married Caucasian male, father to 56 stepchild, grandfather to 2 step-grandchildren who I last saw in the office  12/05/2021.  He is accompanied by his wife Lucas [name] today.  He had diagnostic cath performed by me in January after a positive Myoview that revealed normal coronary arteries, normal LV function suggesting a false positive test. He has PAF with RVR in the past with atypical chest pain. He also has CLL which is quiescent followed by Dr. [name]. He did have a 3-week photo shoot safari in [place] in the [place] after I cath'd him and has taken [employer]. He is otherwise active and asymptomatic. His most recent lab work performed in January revealed total cholesterol 168, LDL 107, HDL 44. He was admitted to [facility] ER on 06/27/13 by Dr. [name] for A. Fib. Plans were to convert him with oral flecainide however he converted on his own. He was seen in the [facility] ER by Dr. [name]. Since I saw him in the office one year ago he has had one episode of PAF in March of 2015 while he was in [place]. His travel plans in the upcoming future included [place], [place], [place] in [place] in May followed by [place] in September.  he was recently in [place] and had an episode of PAF on July 23 well on a train and a dining car. His most recent episode was on 04/13/17 after being woken up from a nap with rapid heart rate. This took 5 hours to spontaneously convert.    His travels continued to  [place] and [place] as well as [place] and [place] in August.  2019 .   Unfortunately, because of COVID-19 his traveling has been put on hold although he is still active.  He continues to play golf and pickleball.  He had arthroscopic surgery on his right knee in June  for torn meniscus.    He did travel to [place] since I saw him last to [place] in [place] and is planning to leave this coming Friday to [place] and [place].  He is completely asymptomatic.  Did have 5 episodes of A-fib over the last year the last 1 being 08/14/2021 lasting for several hours, and resolving spontaneously.  He is on Eliquis oral anticoagulation.  Since I saw him 8 months ago he did have an episode of syncope and was evaluated in the emergency room 06/04/2022.  He had a 2D echo which was essentially normal and an event monitor performed 06/15/2022 that showed runs of A-fib with RVR associated with dizziness.  He is scheduled to see Dr.Mealor later today to discuss further treatment including antiarrhythmic therapy versus ablation.  He is scheduled to go to [place], [place] in [place] on January 7 and the [place] and [place] countries in July.     Current Meds  Medication Sig   brimonidine (ALPHAGAN) 0.2 % ophthalmic solution Place 1 drop into the left eye 2 (two) times daily.    diltiazem (CARDIZEM) 30 MG tablet Take 1 tablet every 4 hours AS NEEDED for AFIB heart rate >100 as long as top BP >100.   ELIQUIS 5 MG TABS tablet TAKE 1 TABLET BY MOUTH TWICE  DAILY   ketoconazole (NIZORAL) 2 % cream Apply 1 Application topically daily  as needed for irritation (rash).   levothyroxine (SYNTHROID) 25 MCG tablet 1 tablet in the morning on an empty stomach Orally Once a day for 90 days   metFORMIN (GLUCOPHAGE-XR) 500 MG 24 hr tablet Take 500 mg by mouth daily with supper.   metoprolol succinate (TOPROL-XL) 25 MG 24 hr tablet TAKE 1 TABLET BY MOUTH DAILY   Probiotic Product (PROBIOTIC PO) Take 1 capsule by mouth at bedtime.   timolol (TIMOPTIC) 0.5 % ophthalmic solution Place 1 drop into the left eye daily. Left eye     Allergies  Allergen Reactions   Nsaids Other (See Comments)    Told to avoid all NSAIDs while taking Eliquis   Shellfish Allergy Anaphylaxis and Swelling    Adhesive [Tape] Rash and Other (See Comments)    Redness Irritation    Latex Rash    Social History   Socioeconomic History   Marital status: Married    Spouse name: Not on file   Number of children: 1   Years of education: Not on file   Highest education level: Not on file  Occupational History    Employer: RETIRED  Tobacco Use   Smoking status: Former    Packs/day: 1.00    Years: 3.00    Total pack years: 3.00    Types: Cigarettes    Start date: 09/19/1962    Quit date: 12/17/1965    Years since quitting: 56.6   Smokeless tobacco: Never   Tobacco comments:    quit 5 yeras ago  Vaping Use   Vaping Use: Never used  Substance and Sexual Activity   Alcohol use: Yes    Alcohol/week: 5.0 standard drinks of alcohol    Types: 5 Glasses of wine per week    Comment: 5 glasses of wine weekly 06/29/22   Drug use: No   Sexual activity: Yes  Other Topics Concern   Not on file  Social History Narrative   Married Caucasian male, father to 1 stepchild, grandfather to 2 step-grandchildren.   Lives with his wife.   Travels a lot & enjoys photography.   Social Determinants of Health   Financial Resource Strain: Not on file  Food Insecurity: Not on file  Transportation Needs: Not on file  Physical Activity: Not on file  Stress: Not on file  Social Connections: Not on file  Intimate Partner Violence: Not on file     Review of Systems: General: negative for chills, fever, night sweats or weight changes.  Cardiovascular: negative for chest pain, dyspnea on exertion, edema, orthopnea, palpitations, paroxysmal nocturnal dyspnea or shortness of breath Dermatological: negative for rash Respiratory: negative for cough or wheezing Urologic: negative for hematuria Abdominal: negative for nausea, vomiting, diarrhea, bright red blood per rectum, melena, or hematemesis Neurologic: negative for visual changes, syncope, or dizziness All other systems reviewed and are otherwise negative  except as noted above.    Blood pressure 134/72, pulse 75, height 6' (1.829 m), weight 171 lb 12.8 oz (77.9 kg), SpO2 94 %.  General appearance: alert and no distress Neck: no adenopathy, no carotid bruit, no JVD, supple, symmetrical, trachea midline, and thyroid not enlarged, symmetric, no tenderness/mass/nodules Lungs: clear to auscultation bilaterally Heart: regular rate and rhythm, S1, S2 normal, no murmur, click, rub or gallop Extremities: extremities normal, atraumatic, no cyanosis or edema Pulses: 2+ and symmetric Skin: Skin color, texture, turgor normal. No rashes or lesions Neurologic: Grossly normal  EKG not performed today  ASSESSMENT AND PLAN:   Paroxysmal atrial fibrillation (  [place]) History of PAF on Eliquis.  Has had multiple episodes of dizziness.  He recently had an event monitor placed that showed episodes of A-fib with RVR.  He is on low-dose beta-blocker and was seen in the A-fib clinic and put on as needed diltiazem for rapid heart rate.  Scheduled to see Dr. Myles Gip , EP, later this afternoon to discuss antiarrhythmic therapy, ablation versus continued medical care.  Syncope Recent episode of syncope seen in the emergency room which time his heart rate was in the 40s.  Was question whether this was related to Viagra which she has been told to stop.  He did wear an event monitor that showed A-fib with RVR.  This may have been the cause.  Scheduled to see EP this afternoon to further evaluate.     [name] MD FACP,FACC,FAHA, [facility] 07/31/2022 10:56 AM

## 2022-07-31 NOTE — Patient Instructions (Signed)
Medication Instructions:  Your physician recommends that you continue on your current medications as directed. Please refer to the Current Medication list given to you today.  *If you need a refill on your cardiac medications before your next appointment, please call your pharmacy*  Lab Work: BMET and CBC prior to CT scan and ablation (see instruction letter) If you have labs (blood work) drawn today and your tests are completely normal, you will receive your results only by: [place] (if you have MyChart) OR A paper copy in the mail If you have any lab test that is abnormal or we need to change your treatment, we will call you to review the results.  Testing/Procedures: Your physician has requested that you have cardiac CT. Cardiac computed tomography (CT) is a painless test that uses an x-ray machine to take clear, detailed pictures of your heart. For further information please visit [email or web address]. Please follow instruction sheet as given.  Your physician has recommended that you have an ablation. Catheter ablation is a medical procedure used to treat some cardiac arrhythmias (irregular heartbeats). During catheter ablation, a long, thin, flexible tube is put into a blood vessel in your groin (upper thigh), or neck. This tube is called an ablation catheter. It is then guided to your heart through the blood vessel. Radio frequency waves destroy small areas of heart tissue where abnormal heartbeats may cause an arrhythmia to start. Please see the instruction sheet given to you today.  Follow-Up: At [facility], you and your health needs are our priority.  As part of our continuing mission to provide you with exceptional heart care, we have created designated Provider Care Teams.  These Care Teams include your primary Cardiologist (physician) and Advanced Practice Providers (APPs -  Physician Assistants and Nurse Practitioners) who all work together to provide you with the  care you need, when you need it.  Your next appointment:   We will call you to arrange follow up appointments  Important Information About Sugar

## 2022-07-31 NOTE — Progress Notes (Signed)
Electrophysiology Office Note:    Date:  07/31/2022   ID:  [name], DOB 1941-01-31, MRN [id number]  PCP:  Burnard Bunting, [facility] Cardiologist:  [name], MD Electrophysiologist:  Melida Quitter, MD     Referring MD: Oliver Barre, PA   History of Present Illness:    Lucas [name] is a 81 y.o. male with a hx listed below, significant for paroxysmal atrial fibrillation, referred for arrhythmia management.  He was diagnosed with AF in about 2014 and has had a few ER visits with [facility] in the past. He is more facile now with controlling his rate with an extra dose of metoprolol and has not needed the ER in several years.  In the past 2 years, he has had 4 or 5 AF episodes per year.  He recently wore a monitor that showed AF with variable rates, up to 160 bpm.  He is symptomatic and knows instantly when he goes into atrial fibrillation. Exacerbations are often attributable to dehydration and have occurred with travel and outdoor activity.  Today, he is doing well.  No recent palpitations, chest pain, syncope, presyncope.   Past Medical History:  Diagnosis Date   Atrial fibrillation ([place])    paroxysmal; Normal Coronary Arteries on Cath Jan 2013   Cataract    CLL (chronic lymphoblastic leukemia) 09/03/2011   dx 1999   Diabetes mellitus,borderline 09/10/2011   Glaucoma    Goals of care, counseling/discussion 09/17/2021    Past Surgical History:  Procedure Laterality Date   CARDIOVASCULAR STRESS TEST  09/15/2011   Mild-moderate perfusion defect due to infarct/scar w/ moderate perinfarct ischemia seen in basal inferoseptal, basal inferior, mid inferoseptal, mid inferior and apical inferior regions. Observed defect may in part be attributable to diagphragmatic attenuation, however there is also reversibility that would suggest a true defect. EKG negative for ischemia.   CATARACT EXTRACTION  2012   COLONOSCOPY     CYST REMOVAL TRUNK  2010    back   GUM SURGERY  111/2021   for a crown   LEFT HEART CATHETERIZATION WITH CORONARY ANGIOGRAM N/A 09/17/2011   Procedure: LEFT HEART CATHETERIZATION WITH CORONARY ANGIOGRAM;  Surgeon: [name], MD;  Location: [facility] CATH LAB: Normal Coronary Arteries. EF ~60%. No RWMA   ROTATOR CUFF REPAIR  1999   TONSILLECTOMY     TRANSTHORACIC ECHOCARDIOGRAM  09/15/2011; Dec 2014   EF >55%, Normal LV systolic function; EF 24-23%. GR 1 DD. No RWMA.  Borderline left atrial dilation. Otherwise normal    Current Medications: Current Meds  Medication Sig   brimonidine (ALPHAGAN) 0.2 % ophthalmic solution Place 1 drop into the left eye 2 (two) times daily.    diltiazem (CARDIZEM) 30 MG tablet Take 1 tablet every 4 hours AS NEEDED for AFIB heart rate >100 as long as top BP >100.   ELIQUIS 5 MG TABS tablet TAKE 1 TABLET BY MOUTH TWICE  DAILY   ketoconazole (NIZORAL) 2 % cream Apply 1 Application topically daily as needed for irritation (rash).   levothyroxine (SYNTHROID) 25 MCG tablet 1 tablet in the morning on an empty stomach Orally Once a day for 90 days   metFORMIN (GLUCOPHAGE-XR) 500 MG 24 hr tablet Take 500 mg by mouth daily with supper.   metoprolol succinate (TOPROL-XL) 25 MG 24 hr tablet TAKE 1 TABLET BY MOUTH DAILY   Probiotic Product (PROBIOTIC PO) Take 1 capsule by mouth at bedtime.   timolol (TIMOPTIC) 0.5 %  ophthalmic solution Place 1 drop into the left eye daily. Left eye     Allergies:   Nsaids, Shellfish allergy, Adhesive [tape], and Latex   Social History   Socioeconomic History   Marital status: Married    Spouse name: Not on file   Number of children: 1   Years of education: Not on file   Highest education level: Not on file  Occupational History    Employer: RETIRED  Tobacco Use   Smoking status: Former    Packs/day: 1.00    Years: 3.00    Total pack years: 3.00    Types: Cigarettes    Start date: 09/19/1962    Quit date: 12/17/1965    Years since quitting: 56.6    Smokeless tobacco: Never   Tobacco comments:    quit 91 yeras ago  Vaping Use   Vaping Use: Never used  Substance and Sexual Activity   Alcohol use: Yes    Alcohol/week: 5.0 standard drinks of alcohol    Types: 5 Glasses of wine per week    Comment: 5 glasses of wine weekly 06/29/22   Drug use: No   Sexual activity: Yes  Other Topics Concern   Not on file  Social History Narrative   Married Caucasian male, father to 1 stepchild, grandfather to 2 step-grandchildren.   Lives with his wife.   Travels a lot & enjoys photography.   Social Determinants of Health   Financial Resource Strain: Not on file  Food Insecurity: Not on file  Transportation Needs: Not on file  Physical Activity: Not on file  Stress: Not on file  Social Connections: Not on file     Family History: The patient's family history includes Cancer in his mother, paternal grandmother, and sister; Colon cancer in his mother; Heart attack in his father and paternal grandmother; Stroke in his father and paternal grandfather. There is no history of Esophageal cancer, Rectal cancer, or Stomach cancer.  ROS:   Please see the history of present illness.    All other systems reviewed and are negative.  EKGs/Labs/Other Studies Reviewed Today:     I personally reviewed strips from the cardiac monitor of 07/28/2022: There is AF with uncontrolled rates.  EKG:  Last EKG results: today - sinus rhythm   Recent Labs: 06/04/2022: ALT 12 06/05/2022: BUN 12; Creatinine, Ser 0.87; Hemoglobin 13.2; Magnesium 2.0; Platelets 65; Potassium 4.1; Sodium 139     Physical Exam:    VS:  BP 128/68   Pulse 65   Ht 6' (1.829 m)   Wt 172 lb (78 kg)   SpO2 95%   BMI 23.33 kg/m     Wt Readings from Last 3 Encounters:  07/31/22 172 lb (78 kg)  07/31/22 171 lb 12.8 oz (77.9 kg)  06/29/22 167 lb 9.6 oz (76 kg)     GEN:  Well nourished, well developed in no acute distress CARDIAC: RRR, no murmurs, rubs, gallops RESPIRATORY:   Normal work of breathing MUSCULOSKELETAL: no edema    ASSESSMENT & PLAN:    Paroxysmal atrial fibrillation: Very symptomatic, having multiple events per year.  We discussed management options.  He does not want to take medication. We discussed the indication, rationale, logistics, anticipated benefits, and potential risks of the ablation procedure including but not limited to -- bleed at the groin access site, chest pain, damage to nearby organs such as the diaphragm, lungs, or esophagus, need for a drainage tube, or prolonged hospitalization. I explained that the risk  for stroke, heart attack, need for open chest surgery, or even death is very low but not zero. he  expressed understanding and wishes to proceed. Secondary hypercoagulable state: continue eliquis        Medication Adjustments/Labs and Tests Ordered: Current medicines are reviewed at length with the patient today.  Concerns regarding medicines are outlined above.  No orders of the defined types were placed in this encounter.  No orders of the defined types were placed in this encounter.    Signed, Melida Quitter, MD  07/31/2022 3:27 PM    [place]

## 2022-10-07 ENCOUNTER — Ambulatory Visit: Payer: Medicare Other | Attending: Internal Medicine

## 2022-10-07 DIAGNOSIS — I480 Paroxysmal atrial fibrillation: Secondary | ICD-10-CM

## 2022-10-08 LAB — CBC WITH DIFFERENTIAL/PLATELET
Basophils Absolute: 0 10*3/uL (ref 0.0–0.2)
Basos: 0 %
EOS (ABSOLUTE): 0 10*3/uL (ref 0.0–0.4)
Eos: 0 %
Hematocrit: 47.1 % (ref 37.5–51.0)
Hemoglobin: 15.1 g/dL (ref 13.0–17.7)
Immature Grans (Abs): 0 10*3/uL (ref 0.0–0.1)
Immature Granulocytes: 0 %
Lymphocytes Absolute: 11.6 10*3/uL — ABNORMAL HIGH (ref 0.7–3.1)
Lymphs: 79 %
MCH: 29.2 pg (ref 26.6–33.0)
MCHC: 32.1 g/dL (ref 31.5–35.7)
MCV: 91 fL (ref 79–97)
Monocytes Absolute: 0.5 10*3/uL (ref 0.1–0.9)
Monocytes: 3 %
Neutrophils Absolute: 2.7 10*3/uL (ref 1.4–7.0)
Neutrophils: 18 %
Platelets: 95 10*3/uL — CL (ref 150–450)
RBC: 5.18 x10E6/uL (ref 4.14–5.80)
RDW: 12.5 % (ref 11.6–15.4)
WBC: 14.9 10*3/uL — ABNORMAL HIGH (ref 3.4–10.8)

## 2022-10-08 LAB — BASIC METABOLIC PANEL
BUN/Creatinine Ratio: 16 (ref 10–24)
BUN: 15 mg/dL (ref 8–27)
CO2: 29 mmol/L (ref 20–29)
Calcium: 8.9 mg/dL (ref 8.6–10.2)
Chloride: 96 mmol/L (ref 96–106)
Creatinine, Ser: 0.92 mg/dL (ref 0.76–1.27)
Glucose: 162 mg/dL — ABNORMAL HIGH (ref 70–99)
Potassium: 4.5 mmol/L (ref 3.5–5.2)
Sodium: 136 mmol/L (ref 134–144)
eGFR: 84 mL/min/{1.73_m2} (ref >59)

## 2022-10-09 ENCOUNTER — Telehealth (HOSPITAL_COMMUNITY): Payer: Self-pay | Admitting: *Deleted

## 2022-10-09 NOTE — Telephone Encounter (Signed)
Reaching out to patient to offer assistance regarding upcoming cardiac imaging study; pt verbalizes understanding of appt date/time, parking situation and where to check in, pre-test NPO status and verified current allergies; name and call back number provided for further questions should they arise  Lucas Clement RN Navigator Cardiac Imaging [facility] [phone] office [phone] cell  Patient to take his daily medications.

## 2022-10-12 ENCOUNTER — Encounter: Payer: Self-pay | Admitting: Hematology & Oncology

## 2022-10-12 ENCOUNTER — Inpatient Hospital Stay: Payer: Medicare Other | Admitting: Hematology & Oncology

## 2022-10-12 ENCOUNTER — Inpatient Hospital Stay: Payer: Medicare Other | Attending: Hematology & Oncology

## 2022-10-12 VITALS — BP 138/69 | HR 57 | Temp 97.6°F | Resp 20 | Ht 72.0 in | Wt 167.0 lb

## 2022-10-12 DIAGNOSIS — Z886 Allergy status to analgesic agent status: Secondary | ICD-10-CM | POA: Insufficient documentation

## 2022-10-12 DIAGNOSIS — C9110 Chronic lymphocytic leukemia of B-cell type not having achieved remission: Secondary | ICD-10-CM | POA: Diagnosis present

## 2022-10-12 DIAGNOSIS — Z7901 Long term (current) use of anticoagulants: Secondary | ICD-10-CM | POA: Insufficient documentation

## 2022-10-12 LAB — CBC WITH DIFFERENTIAL (CANCER CENTER ONLY)
Abs Immature Granulocytes: 0.03 10*3/uL (ref 0.00–0.07)
Basophils Absolute: 0 10*3/uL (ref 0.0–0.1)
Basophils Relative: 0 %
Eosinophils Absolute: 0 10*3/uL (ref 0.0–0.5)
Eosinophils Relative: 0 %
HCT: 46.6 % (ref 39.0–52.0)
Hemoglobin: 15 g/dL (ref 13.0–17.0)
Immature Granulocytes: 0 %
Lymphocytes Relative: 70 %
Lymphs Abs: 11.2 10*3/uL — ABNORMAL HIGH (ref 0.7–4.0)
MCH: 29.7 pg (ref 26.0–34.0)
MCHC: 32.2 g/dL (ref 30.0–36.0)
MCV: 92.3 fL (ref 80.0–100.0)
Monocytes Absolute: 0.5 10*3/uL (ref 0.1–1.0)
Monocytes Relative: 3 %
Neutro Abs: 4.3 10*3/uL (ref 1.7–7.7)
Neutrophils Relative %: 27 %
Platelet Count: 96 10*3/uL — ABNORMAL LOW (ref 150–400)
RBC: 5.05 MIL/uL (ref 4.22–5.81)
RDW: 13.8 % (ref 11.5–15.5)
WBC Count: 16.1 10*3/uL — ABNORMAL HIGH (ref 4.0–10.5)
nRBC: 0 % (ref 0.0–0.2)

## 2022-10-12 LAB — CMP (CANCER CENTER ONLY)
ALT: 11 U/L (ref 0–44)
AST: 12 U/L — ABNORMAL LOW (ref 15–41)
Albumin: 4.7 g/dL (ref 3.5–5.0)
Alkaline Phosphatase: 68 U/L (ref 38–126)
Anion gap: 6 (ref 5–15)
BUN: 15 mg/dL (ref 8–23)
CO2: 32 mmol/L (ref 22–32)
Calcium: 9.6 mg/dL (ref 8.9–10.3)
Chloride: 100 mmol/L (ref 98–111)
Creatinine: 0.99 mg/dL (ref 0.61–1.24)
GFR, Estimated: >60 mL/min (ref >60)
Glucose, Bld: 187 mg/dL — ABNORMAL HIGH (ref 70–99)
Potassium: 5.1 mmol/L (ref 3.5–5.1)
Sodium: 138 mmol/L (ref 135–145)
Total Bilirubin: 0.9 mg/dL (ref 0.3–1.2)
Total Protein: 7.2 g/dL (ref 6.5–8.1)

## 2022-10-12 LAB — LACTATE DEHYDROGENASE: LDH: 145 U/L (ref 98–192)

## 2022-10-12 LAB — SAVE SMEAR(SSMR), FOR PROVIDER SLIDE REVIEW

## 2022-10-12 NOTE — Progress Notes (Signed)
Hematology and Oncology Follow Up Visit  Lucas [name] CJ:[id number] 04/15/1941 82 y.o. 10/12/2022   Principle Diagnosis:  1. Chronic lymphocytic leukemia - stage C  Current Therapy:   Observation    Interim History:  Lucas [name] is here today for a follow-up.  A lot has been going on with him.  Unfortunately, his wife had a ruptured appendix over the weekend.  She had surgery on Saturday.  It sounds like she will be in the hospital for a little bit.  Thankfully, this happened when they were not overseas.  They were just over in [place].  He really enjoyed himself over there.  The next trip is going be to [place] in June-July.  He gets his cardiac ablation I think in a week or so.  He is on Eliquis.  He is doing well on the Eliquis.  He has had no problems with fever.  He has had no issues with COVID.  There is been no change in bowel or bladder habits.  He has had no cough.  He has had no rashes.  There is been no bleeding.  Overall, I would say that his performance status is probably ECOG 0.   Medications:  Allergies as of 10/12/2022       Reactions   Nsaids Other (See Comments)   Told to avoid all NSAIDs while taking Eliquis   Shellfish Allergy Anaphylaxis, Swelling   Adhesive [tape] Rash, Other (See Comments)   Redness Irritation    Latex Rash        Medication List        Accurate as of October 12, 2022  9:00 AM. If you have any questions, ask your nurse or doctor.          Accu-Chek Aviva Plus test strip Generic drug: glucose blood 1 each daily.   brimonidine 0.2 % ophthalmic solution Commonly known as: ALPHAGAN Place 1 drop into the left eye 2 (two) times daily.   diltiazem 30 MG tablet Commonly known as: Cardizem Take 1 tablet every 4 hours AS NEEDED for AFIB heart rate >100 as long as top BP >100.   Eliquis 5 MG Tabs tablet Generic drug: apixaban TAKE 1 TABLET BY MOUTH TWICE  DAILY   EPINEPHrine 0.3 mg/0.3 mL Soaj  injection Commonly known as: EPI-PEN as needed.   ketoconazole 2 % cream Commonly known as: NIZORAL Apply 1 Application topically daily as needed for irritation (rash).   metFORMIN 500 MG 24 hr tablet Commonly known as: GLUCOPHAGE-XR Take 500 mg by mouth daily with supper.   metoprolol succinate 25 MG 24 hr tablet Commonly known as: TOPROL-XL TAKE 1 TABLET BY MOUTH DAILY   PROBIOTIC PO Take 1 capsule by mouth at bedtime.   Synthroid 25 MCG tablet Generic drug: levothyroxine 1 tablet in the morning on an empty stomach Orally Once a day for 90 days   timolol 0.5 % ophthalmic solution Commonly known as: TIMOPTIC Place 1 drop into the left eye daily. Left eye        Allergies:  Allergies  Allergen Reactions   Nsaids Other (See Comments)    Told to avoid all NSAIDs while taking Eliquis   Shellfish Allergy Anaphylaxis and Swelling   Adhesive [Tape] Rash and Other (See Comments)    Redness Irritation    Latex Rash    Past Medical History, Surgical history, Social history, and Family History were reviewed and updated.  Review of Systems: Review of Systems  Constitutional: Negative.  HENT: Negative.    Eyes: Negative.   Respiratory: Negative.    Cardiovascular: Negative.   Gastrointestinal: Negative.   Genitourinary: Negative.   Musculoskeletal: Negative.   Skin: Negative.   Neurological: Negative.   Endo/Heme/Allergies: Negative.   Psychiatric/Behavioral: Negative.       Physical Exam:  height is 6' (1.829 m) and weight is 167 lb (75.8 kg). His oral temperature is 97.6 F (36.4 C). His blood pressure is 138/69 and his pulse is 57 (abnormal). His respiration is 20 and oxygen saturation is 99%.   Wt Readings from Last 3 Encounters:  10/12/22 167 lb (75.8 kg)  07/31/22 172 lb (78 kg)  07/31/22 171 lb 12.8 oz (77.9 kg)    Physical Exam Vitals reviewed.  HENT:     Head: Normocephalic and atraumatic.  Eyes:     Pupils: Pupils are equal, round, and  reactive to light.  Cardiovascular:     Rate and Rhythm: Normal rate and regular rhythm.     Heart sounds: Normal heart sounds.  Pulmonary:     Effort: Pulmonary effort is normal.     Breath sounds: Normal breath sounds.  Abdominal:     General: Bowel sounds are normal.     Palpations: Abdomen is soft.  Musculoskeletal:        General: No tenderness or deformity. Normal range of motion.     Cervical back: Normal range of motion.  Lymphadenopathy:     Cervical: No cervical adenopathy.  Skin:    General: Skin is warm and dry.     Findings: No erythema or rash.  Neurological:     Mental Status: He is alert and oriented to person, place, and time.  Psychiatric:        Behavior: Behavior normal.        Thought Content: Thought content normal.        Judgment: Judgment normal.     Lab Results  Component Value Date   WBC 16.1 (H) 10/12/2022   HGB 15.0 10/12/2022   HCT 46.6 10/12/2022   MCV 92.3 10/12/2022   PLT 96 (L) 10/12/2022   No results found for: "FERRITIN", "IRON", "TIBC", "UIBC", "IRONPCTSAT" Lab Results  Component Value Date   RETICCTPCT 1.0 06/15/2013   RBC 5.05 10/12/2022   RETICCTABS 47.6 06/15/2013   Lab Results  Component Value Date   KPAFRELGTCHN 22.0 (H) 01/19/2020   LAMBDASER 10.7 01/19/2020   KAPLAMBRATIO 2.06 (H) 01/19/2020   Lab Results  Component Value Date   IGGSERUM 993 04/24/2022   IGA 108 04/24/2022   IGMSERUM 122 04/24/2022   Lab Results  Component Value Date   TOTALPROTELP 6.5 06/15/2013   ALBUMINELP 61.7 06/15/2013   A1GS 3.5 06/15/2013   A2GS 9.8 06/15/2013   BETS 5.3 06/15/2013   BETA2SER 8.7 (H) 06/15/2013   GAMS 11.0 (L) 06/15/2013   MSPIKE Not Observed 01/25/2017   SPEI * 06/15/2013     Chemistry      Component Value Date/Time   NA 136 10/07/2022 0812   NA 142 06/28/2017 0855   K 4.5 10/07/2022 0812   K 4.5 06/28/2017 0855   CL 96 10/07/2022 0812   CL 99 06/28/2017 0855   CO2 29 10/07/2022 0812   CO2 32 06/28/2017  0855   BUN 15 10/07/2022 0812   BUN 15 06/28/2017 0855   CREATININE 0.92 10/07/2022 0812   CREATININE 0.96 04/24/2022 0808   CREATININE 1.0 06/28/2017 0855      Component Value Date/Time  CALCIUM 8.9 10/07/2022 0812   CALCIUM 9.2 06/28/2017 0855   ALKPHOS 58 06/04/2022 0213   ALKPHOS 74 06/28/2017 0855   AST 16 06/04/2022 0213   AST 12 (L) 04/24/2022 0808   ALT 12 06/04/2022 0213   ALT 9 04/24/2022 0808   ALT 13 06/28/2017 0855   BILITOT 0.5 06/04/2022 0213   BILITOT 0.9 04/24/2022 0808     Impression and Plan: Lucas [name] is very pleasant 82 year old male with CLL. We have not had to treat him now for over 21 years.    Everything looks quite stable from my point of view.  His platelet count is slowly rising.  His hemoglobin is stable.  His white cell count is slowly going up but still low I think is asymptomatic.  I think he has other health issues that are more active.  I did look at his blood smear on the microscope.  Again I do not see anything that looked suspicious.  Again, we will plan to see him in another 6 months.    Volanda Napoleon, MD 2/26/20249:00 AM

## 2022-10-13 ENCOUNTER — Ambulatory Visit (HOSPITAL_BASED_OUTPATIENT_CLINIC_OR_DEPARTMENT_OTHER)
Admission: RE | Admit: 2022-10-13 | Discharge: 2022-10-13 | Disposition: A | Discharge status: Home / Self Care | Payer: Medicare Other | Source: Ambulatory Visit | Attending: Cardiovascular Disease | Admitting: Cardiovascular Disease

## 2022-10-13 ENCOUNTER — Encounter (HOSPITAL_BASED_OUTPATIENT_CLINIC_OR_DEPARTMENT_OTHER): Payer: Self-pay

## 2022-10-13 DIAGNOSIS — I480 Paroxysmal atrial fibrillation: Secondary | ICD-10-CM

## 2022-10-13 MED ORDER — IOHEXOL 350 MG/ML SOLN
100.0000 mL | Freq: Once | INTRAVENOUS | Status: AC | PRN
  Administered 2022-10-13: 80 mL via INTRAVENOUS

## 2022-10-19 NOTE — Pre-Procedure Instructions (Signed)
Instructed patient on the following items: Arrival time 0515 Nothing to eat or drink after midnight No meds AM of procedure Responsible person to drive you home and stay with you for 24 hrs  Have you missed any doses of anti-coagulant Eliquis-  hasn't missed any doses

## 2022-10-20 ENCOUNTER — Other Ambulatory Visit: Payer: Self-pay

## 2022-10-20 ENCOUNTER — Other Ambulatory Visit (HOSPITAL_COMMUNITY): Payer: Self-pay

## 2022-10-20 ENCOUNTER — Ambulatory Visit (HOSPITAL_BASED_OUTPATIENT_CLINIC_OR_DEPARTMENT_OTHER): Payer: Medicare Other | Admitting: Certified Registered Nurse Anesthetist

## 2022-10-20 ENCOUNTER — Ambulatory Visit (HOSPITAL_COMMUNITY): Payer: Medicare Other | Admitting: Certified Registered Nurse Anesthetist

## 2022-10-20 ENCOUNTER — Ambulatory Visit (HOSPITAL_COMMUNITY)
Admission: RE | Admit: 2022-10-20 | Discharge: 2022-10-20 | Disposition: A | Discharge status: Home / Self Care | Payer: Medicare Other | Attending: Cardiovascular Disease | Admitting: Cardiovascular Disease

## 2022-10-20 ENCOUNTER — Encounter (HOSPITAL_COMMUNITY): Payer: Self-pay | Admitting: Cardiovascular Disease

## 2022-10-20 ENCOUNTER — Ambulatory Visit (HOSPITAL_COMMUNITY)
Admission: RE | Disposition: A | Discharge status: Home / Self Care | Payer: Self-pay | Source: Home / Self Care | Attending: Cardiovascular Disease

## 2022-10-20 DIAGNOSIS — I4891 Unspecified atrial fibrillation: Secondary | ICD-10-CM | POA: Diagnosis not present

## 2022-10-20 DIAGNOSIS — I480 Paroxysmal atrial fibrillation: Secondary | ICD-10-CM | POA: Insufficient documentation

## 2022-10-20 DIAGNOSIS — Z7901 Long term (current) use of anticoagulants: Secondary | ICD-10-CM | POA: Diagnosis not present

## 2022-10-20 DIAGNOSIS — Z79899 Other long term (current) drug therapy: Secondary | ICD-10-CM | POA: Insufficient documentation

## 2022-10-20 DIAGNOSIS — D6869 Other thrombophilia: Secondary | ICD-10-CM | POA: Diagnosis not present

## 2022-10-20 HISTORY — PX: ATRIAL FIBRILLATION ABLATION: EP1191

## 2022-10-20 LAB — GLUCOSE, CAPILLARY
Glucose-Capillary: 148 mg/dL — ABNORMAL HIGH (ref 70–99)
Glucose-Capillary: 170 mg/dL — ABNORMAL HIGH (ref 70–99)

## 2022-10-20 LAB — POCT ACTIVATED CLOTTING TIME
Activated Clotting Time: 309 seconds
Activated Clotting Time: 331 seconds

## 2022-10-20 SURGERY — ATRIAL FIBRILLATION ABLATION
Anesthesia: General

## 2022-10-20 MED ORDER — DOBUTAMINE INFUSION FOR EP/ECHO/NUC (1000 MCG/ML)
INTRAVENOUS | Status: DC | PRN
  Administered 2022-10-20: 20 ug/kg/min via INTRAVENOUS

## 2022-10-20 MED ORDER — HEPARIN SODIUM (PORCINE) 1000 UNIT/ML IJ SOLN
INTRAMUSCULAR | Status: DC | PRN
  Administered 2022-10-20: 1000 [IU] via INTRAVENOUS

## 2022-10-20 MED ORDER — DEXAMETHASONE SODIUM PHOSPHATE 10 MG/ML IJ SOLN
INTRAMUSCULAR | Status: DC | PRN
  Administered 2022-10-20: 4 mg via INTRAVENOUS

## 2022-10-20 MED ORDER — PROPOFOL 10 MG/ML IV BOLUS
INTRAVENOUS | Status: DC | PRN
  Administered 2022-10-20 (×2): 70 mg via INTRAVENOUS

## 2022-10-20 MED ORDER — ACETAMINOPHEN 325 MG PO TABS: 650.0000 mg | ORAL_TABLET | ORAL | Status: DC | PRN

## 2022-10-20 MED ORDER — LIDOCAINE 2% (20 MG/ML) 5 ML SYRINGE
INTRAMUSCULAR | Status: DC | PRN
  Administered 2022-10-20: 40 mg via INTRAVENOUS

## 2022-10-20 MED ORDER — FENTANYL CITRATE (PF) 100 MCG/2ML IJ SOLN
INTRAMUSCULAR | Status: AC
  Filled 2022-10-20: qty 2

## 2022-10-20 MED ORDER — SODIUM CHLORIDE 0.9% FLUSH: 3.0000 mL | INTRAVENOUS | Status: DC | PRN

## 2022-10-20 MED ORDER — SODIUM CHLORIDE 0.9 % IV SOLN: 250.0000 mL | INTRAVENOUS | Status: DC | PRN

## 2022-10-20 MED ORDER — PHENYLEPHRINE HCL-NACL 20-0.9 MG/250ML-% IV SOLN
INTRAVENOUS | Status: DC | PRN
  Administered 2022-10-20: 25 ug/min via INTRAVENOUS

## 2022-10-20 MED ORDER — HEPARIN (PORCINE) IN NACL 1000-0.9 UT/500ML-% IV SOLN
INTRAVENOUS | Status: DC | PRN
  Administered 2022-10-20 (×4): 500 mL

## 2022-10-20 MED ORDER — HEPARIN SODIUM (PORCINE) 1000 UNIT/ML IJ SOLN
INTRAMUSCULAR | Status: DC | PRN
  Administered 2022-10-20: 13000 [IU] via INTRAVENOUS

## 2022-10-20 MED ORDER — HEPARIN SODIUM (PORCINE) 1000 UNIT/ML IJ SOLN
INTRAMUSCULAR | Status: AC
  Filled 2022-10-20: qty 10

## 2022-10-20 MED ORDER — PROTAMINE SULFATE 10 MG/ML IV SOLN
INTRAVENOUS | Status: DC | PRN
  Administered 2022-10-20: 50 mg via INTRAVENOUS

## 2022-10-20 MED ORDER — ONDANSETRON HCL 4 MG/2ML IJ SOLN
INTRAMUSCULAR | Status: DC | PRN
  Administered 2022-10-20: 4 mg via INTRAVENOUS

## 2022-10-20 MED ORDER — ROCURONIUM BROMIDE 10 MG/ML (PF) SYRINGE
PREFILLED_SYRINGE | INTRAVENOUS | Status: DC | PRN
  Administered 2022-10-20: 10 mg via INTRAVENOUS
  Administered 2022-10-20: 60 mg via INTRAVENOUS

## 2022-10-20 MED ORDER — SUGAMMADEX SODIUM 200 MG/2ML IV SOLN
INTRAVENOUS | Status: DC | PRN
  Administered 2022-10-20: 200 mg via INTRAVENOUS

## 2022-10-20 MED ORDER — DOBUTAMINE INFUSION FOR EP/ECHO/NUC (1000 MCG/ML)
INTRAVENOUS | Status: AC
  Filled 2022-10-20: qty 250

## 2022-10-20 MED ORDER — PANTOPRAZOLE SODIUM 40 MG PO TBEC
40.0000 mg | DELAYED_RELEASE_TABLET | Freq: Every day | ORAL | 0 refills | Status: DC
  Filled 2022-10-20: qty 45, 45d supply, fill #0

## 2022-10-20 MED ORDER — COLCHICINE 0.6 MG PO TABS
0.6000 mg | ORAL_TABLET | Freq: Two times a day (BID) | ORAL | 0 refills | Status: DC
  Filled 2022-10-20: qty 10, 5d supply, fill #0

## 2022-10-20 MED ORDER — ONDANSETRON HCL 4 MG/2ML IJ SOLN: 4.0000 mg | Freq: Four times a day (QID) | INTRAMUSCULAR | Status: DC | PRN

## 2022-10-20 MED ORDER — PHENYLEPHRINE 80 MCG/ML (10ML) SYRINGE FOR IV PUSH (FOR BLOOD PRESSURE SUPPORT)
PREFILLED_SYRINGE | INTRAVENOUS | Status: DC | PRN
  Administered 2022-10-20: 80 ug via INTRAVENOUS

## 2022-10-20 MED ORDER — SODIUM CHLORIDE 0.9% FLUSH: 3.0000 mL | Freq: Two times a day (BID) | INTRAVENOUS | Status: DC

## 2022-10-20 MED ORDER — FENTANYL CITRATE (PF) 250 MCG/5ML IJ SOLN
INTRAMUSCULAR | Status: DC | PRN
  Administered 2022-10-20: 50 ug via INTRAVENOUS

## 2022-10-20 MED ORDER — SODIUM CHLORIDE 0.9 % IV SOLN: INTRAVENOUS | Status: DC

## 2022-10-20 SURGICAL SUPPLY — 20 items
BAG SNAP BAND KOVER 36X36 (MISCELLANEOUS) IMPLANT
CATH 8FR REPROCESSED SOUNDSTAR (CATHETERS) ×1 IMPLANT
CATH 8FR SOUNDSTAR REPROCESSED (CATHETERS) IMPLANT
CATH ABLAT QDOT MICRO BI TC FJ (CATHETERS) IMPLANT
CATH OCTARAY 2.0 F 3-3-3-3-3 (CATHETERS) IMPLANT
CATH PIGTAIL STEERABLE D1 8.7 (WIRE) IMPLANT
CATH S-M CIRCA TEMP PROBE (CATHETERS) IMPLANT
CATH WEB BI DIR CSDF CRV REPRO (CATHETERS) IMPLANT
CLOSURE PERCLOSE PROSTYLE (VASCULAR PRODUCTS) IMPLANT
COVER SWIFTLINK CONNECTOR (BAG) ×1 IMPLANT
DEVICE CLOSURE MYNXGRIP 6/7F (Vascular Products) IMPLANT
PACK EP LATEX FREE (CUSTOM PROCEDURE TRAY) ×1
PACK EP LF (CUSTOM PROCEDURE TRAY) ×1 IMPLANT
PAD DEFIB RADIO PHYSIO CONN (PAD) ×1 IMPLANT
PATCH CARTO3 (PAD) IMPLANT
SHEATH CARTO VIZIGO MED CURVE (SHEATH) IMPLANT
SHEATH PINNACLE 8F 10CM (SHEATH) IMPLANT
SHEATH PINNACLE 9F 10CM (SHEATH) IMPLANT
SHEATH PROBE COVER 6X72 (BAG) IMPLANT
TUBING SMART ABLATE COOLFLOW (TUBING) IMPLANT

## 2022-10-20 NOTE — Anesthesia Preprocedure Evaluation (Signed)
Anesthesia Evaluation  Patient identified by MRN, date of birth, ID band Patient awake    Reviewed: Allergy & Precautions, NPO status , Patient's Chart, lab work & pertinent test results  History of Anesthesia Complications Negative for: history of anesthetic complications  Airway Mallampati: II  TM Distance: >3 FB Neck ROM: Full    Dental  (+) Dental Advisory Given, Chipped,    Pulmonary neg shortness of breath, neg sleep apnea, neg COPD, neg recent URI, former smoker   breath sounds clear to auscultation       Cardiovascular negative cardio ROS  Rhythm:Regular     Neuro/Psych negative neurological ROS  negative psych ROS   GI/Hepatic negative GI ROS, Neg liver ROS,,,  Endo/Other  diabetesHypothyroidism    Renal/GU negative Renal ROS     Musculoskeletal negative musculoskeletal ROS (+)    Abdominal   Peds  Hematology  (+) Blood dyscrasia Eliquis  Lab Results      Component                Value               Date                      WBC                      16.1 (H)            10/12/2022                HGB                      15.0                10/12/2022                HCT                      46.6                10/12/2022                MCV                      92.3                10/12/2022                PLT                      96 (L)              10/12/2022              Anesthesia Other Findings   Reproductive/Obstetrics                             Anesthesia Physical Anesthesia Plan  ASA: 2  Anesthesia Plan: General   Post-op Pain Management: Minimal or no pain anticipated   Induction: Intravenous  PONV Risk Score and Plan: 2 and Propofol infusion, TIVA and Ondansetron  Airway Management Planned: Oral ETT  Additional Equipment: None  Intra-op Plan:   Post-operative Plan: Extubation in OR  Informed Consent: I have reviewed the patients History and Physical,  chart, labs and discussed the procedure including the risks, benefits and alternatives for the  proposed anesthesia with the patient or authorized representative who has indicated his/her understanding and acceptance.     Dental advisory given  Plan Discussed with: CRNA  Anesthesia Plan Comments:        Anesthesia Quick Evaluation

## 2022-10-20 NOTE — Anesthesia Procedure Notes (Signed)
Procedure Name: Intubation Date/Time: 10/20/2022 7:54 AM  Performed by: Inda Coke, CRNAPre-anesthesia Checklist: Patient identified, Emergency Drugs available, Suction available, Timeout performed and Patient being monitored Patient Re-evaluated:Patient Re-evaluated prior to induction Oxygen Delivery Method: Circle system utilized Preoxygenation: Pre-oxygenation with 100% oxygen Induction Type: IV induction Ventilation: Mask ventilation without difficulty Laryngoscope Size: Mac and 4 Grade View: Grade II Tube type: Oral Tube size: 7.5 mm Number of attempts: 1 Airway Equipment and Method: Stylet Placement Confirmation: ETT inserted through vocal cords under direct vision, positive ETCO2, CO2 detector and breath sounds checked- equal and bilateral Secured at: 23 cm Tube secured with: Tape Dental Injury: Teeth and Oropharynx as per pre-operative assessment

## 2022-10-20 NOTE — Transfer of Care (Signed)
Immediate Anesthesia Transfer of Care Note  Patient: Lucas [name]  Procedure(s) Performed: ATRIAL FIBRILLATION ABLATION  Patient Location: PACU  Anesthesia Type:General  Level of Consciousness: awake, alert , and oriented  Airway & Oxygen Therapy: Patient Spontanous Breathing and Patient connected to nasal cannula oxygen  Post-op Assessment: Report given to RN and Post -op Vital signs reviewed and stable  Post vital signs: Reviewed and stable  Last Vitals:  Vitals Value Taken Time  BP 114/62 10/20/22 1007  Temp    Pulse 84 10/20/22 1008  Resp 18 10/20/22 1008  SpO2 94 % 10/20/22 1008  Vitals shown include unvalidated device data.  Last Pain:  Vitals:   10/20/22 0543  TempSrc: Temporal  PainSc:          Complications: There were no known notable events for this encounter.

## 2022-10-20 NOTE — H&P (Signed)
Electrophysiology Office Note:    Date:  10/20/2022   ID:  Lucas [name], DOB April 29, 1941, MRN [id number]  PCP:  Burnard Bunting, MD   [place] Prov[facility] Cardiologist:  [name], MD Electrophysiologist:  Melida Quitter, MD     Referring MD: No ref. provider found   History of Present Illness:    Lucas [name] is a 82 y.o. male with a hx listed below, significant for paroxysmal atrial fibrillation, referred for arrhythmia management.  He was diagnosed with AF in about 2014 and has had a few ER visits with [facility] in the past. He is more facile now with controlling his rate with an extra dose of metoprolol and has not needed the ER in several years.  In the past 2 years, he has had 4 or 5 AF episodes per year.  He recently wore a monitor that showed AF with variable rates, up to 160 bpm.  He is symptomatic and knows instantly when he goes into atrial fibrillation. Exacerbations are often attributable to dehydration and have occurred with travel and outdoor activity.  Today, he is doing well.  No recent palpitations, chest pain, syncope, presyncope.  I reviewed the patient's CT and labs. There was no LAA thrombus. he  has not missed any doses of anticoagulation, and he took his dose last night. There have been no changes in the patient's diagnoses, medications, or condition since our recent clinic visit. He saw his oncologist last month and appears to be doing well from that standpoint.    Past Medical History:  Diagnosis Date   Atrial fibrillation ([place])    paroxysmal; Normal Coronary Arteries on Cath Jan 2013   Cataract    CLL (chronic lymphoblastic leukemia) 09/03/2011   dx 1999   Diabetes mellitus,borderline 09/10/2011   Glaucoma    Goals of care, counseling/discussion 09/17/2021    Past Surgical History:  Procedure Laterality Date   CARDIOVASCULAR STRESS TEST  09/15/2011   Mild-moderate perfusion defect due to infarct/scar w/ moderate perinfarct ischemia  seen in basal inferoseptal, basal inferior, mid inferoseptal, mid inferior and apical inferior regions. Observed defect may in part be attributable to diagphragmatic attenuation, however there is also reversibility that would suggest a true defect. EKG negative for ischemia.   CATARACT EXTRACTION  2012   COLONOSCOPY     CYST REMOVAL TRUNK  2010   back   GUM SURGERY  111/2021   for a crown   LEFT HEART CATHETERIZATION WITH CORONARY ANGIOGRAM N/A 09/17/2011   Procedure: LEFT HEART CATHETERIZATION WITH CORONARY ANGIOGRAM;  Surgeon: [name], MD;  Location: [facility] CATH LAB: Normal Coronary Arteries. EF ~60%. No RWMA   ROTATOR CUFF REPAIR  1999   TONSILLECTOMY     TRANSTHORACIC ECHOCARDIOGRAM  09/15/2011; Dec 2014   EF >55%, Normal LV systolic function; EF 123456. GR 1 DD. No RWMA.  Borderline left atrial dilation. Otherwise normal    Current Medications: Current Meds  Medication Sig   ACCU-CHEK AVIVA PLUS test strip 1 each daily.   brimonidine (ALPHAGAN) 0.2 % ophthalmic solution Place 1 drop into the left eye 2 (two) times daily.    ELIQUIS 5 MG TABS tablet TAKE 1 TABLET BY MOUTH TWICE  DAILY   EPINEPHrine 0.3 mg/0.3 mL IJ SOAJ injection Inject 0.3 mg into the muscle as needed for anaphylaxis.   levothyroxine (SYNTHROID) 25 MCG tablet Take 25 mcg by mouth daily before breakfast.   metFORMIN (GLUCOPHAGE-XR) 500 MG 24 hr tablet Take 500 mg  by mouth at bedtime.   metoprolol succinate (TOPROL-XL) 25 MG 24 hr tablet TAKE 1 TABLET BY MOUTH DAILY   Probiotic Product (PROBIOTIC PO) Take 1 capsule by mouth at bedtime.   timolol (TIMOPTIC) 0.5 % ophthalmic solution Place 1 drop into the left eye daily.     Allergies:   Nsaids, Shellfish allergy, Adhesive [tape], and Latex   Social History   Socioeconomic History   Marital status: Married    Spouse name: Not on file   Number of children: 1   Years of education: Not on file   Highest education level: Not on file  Occupational History     Employer: RETIRED  Tobacco Use   Smoking status: Former    Packs/day: 1.00    Years: 3.00    Total pack years: 3.00    Types: Cigarettes    Start date: 09/19/1962    Quit date: 12/17/1965    Years since quitting: 56.8   Smokeless tobacco: Never   Tobacco comments:    quit 74 yeras ago  Vaping Use   Vaping Use: Never used  Substance and Sexual Activity   Alcohol use: Yes    Alcohol/week: 5.0 standard drinks of alcohol    Types: 5 Glasses of wine per week    Comment: 5 glasses of wine weekly 06/29/22   Drug use: No   Sexual activity: Yes  Other Topics Concern   Not on file  Social History Narrative   Married Caucasian male, father to 1 stepchild, grandfather to 2 step-grandchildren.   Lives with his wife.   Travels a lot & enjoys photography.   Social Determinants of Health   Financial Resource Strain: Not on file  Food Insecurity: Not on file  Transportation Needs: Not on file  Physical Activity: Not on file  Stress: Not on file  Social Connections: Not on file     Family History: The patient's family history includes Cancer in his mother, paternal grandmother, and sister; Colon cancer in his mother; Heart attack in his father and paternal grandmother; Stroke in his father and paternal grandfather. There is no history of Esophageal cancer, Rectal cancer, or Stomach cancer.  ROS:   Please see the history of present illness.    All other systems reviewed and are negative.  EKGs/Labs/Other Studies Reviewed Today:     I personally reviewed strips from the cardiac monitor of 07/28/2022: There is AF with uncontrolled rates.  EKG:  Last EKG results: today - sinus rhythm   Recent Labs: 06/05/2022: Magnesium 2.0 10/12/2022: ALT 11; BUN 15; Creatinine 0.99; Hemoglobin 15.0; Platelet Count 96; Potassium 5.1; Sodium 138     Physical Exam:    VS:  BP 116/83   Pulse 62   Temp (!) 97.1 F (36.2 C) (Temporal)   Resp 20   Ht 6' (1.829 m)   Wt 74.8 kg   SpO2 98%   BMI  22.38 kg/m     Wt Readings from Last 3 Encounters:  10/20/22 74.8 kg  10/12/22 75.8 kg  07/31/22 78 kg     GEN:  Well nourished, well developed in no acute distress CARDIAC: RRR, no murmurs, rubs, gallops RESPIRATORY:  Normal work of breathing MUSCULOSKELETAL: no edema    ASSESSMENT & PLAN:    Paroxysmal atrial fibrillation: Very symptomatic, having multiple events per year -- most recently this past weekend. Plan for ablation today. Consent has been signed. All questions answered. Secondary hypercoagulable state: continue eliquis  Medication Adjustments/Labs and Tests Ordered: Current medicines are reviewed at length with the patient today.  Concerns regarding medicines are outlined above.  Orders Placed This Encounter  Procedures   Glucose, capillary   Informed Consent Details: Physician/Practitioner Attestation; Transcribe to consent form and obtain patient signature   Initiate Pre-op Protocol   Void on call to EP Lab   Confirm CBC and BMP (or CMP) results within 7 days for inpatient and 30 days for outpatient:   Clip right and left femoral area PM before surgery   Clip right internal jugular area PM before surgery   Pre-admission testing diagnosis   EP STUDY   Insert peripheral IV   Meds ordered this encounter  Medications   0.9 %  sodium chloride infusion     Signed, Melida Quitter, MD  10/20/2022 6:55 AM    [place]

## 2022-10-20 NOTE — Progress Notes (Signed)
Pt ambulated to and from bathroom with no oozing from bilateral groin sites

## 2022-10-20 NOTE — Discharge Instructions (Signed)

## 2022-10-21 ENCOUNTER — Encounter (HOSPITAL_COMMUNITY): Payer: Self-pay | Admitting: Cardiovascular Disease

## 2022-10-22 MED FILL — Fentanyl Citrate Preservative Free (PF) Inj 100 MCG/2ML: INTRAMUSCULAR | Qty: 1 | Status: AC

## 2022-10-22 NOTE — Anesthesia Postprocedure Evaluation (Signed)
Anesthesia Post Note  Patient: Lucas [name]  Procedure(s) Performed: ATRIAL FIBRILLATION ABLATION     Patient location during evaluation: Cath Lab Anesthesia Type: General Level of consciousness: awake and alert Pain management: pain level controlled Vital Signs Assessment: post-procedure vital signs reviewed and stable Respiratory status: spontaneous breathing, nonlabored ventilation and respiratory function stable Cardiovascular status: blood pressure returned to baseline and stable Postop Assessment: no apparent nausea or vomiting Anesthetic complications: no   There were no known notable events for this encounter.  Last Vitals:  Vitals:   10/20/22 1230 10/20/22 1300  BP: 113/75 (!) 110/93  Pulse: 66 67  Resp: 12 (!) 21  Temp:    SpO2: 99% 97%    Last Pain:  Vitals:   10/21/22 1438  TempSrc:   PainSc: 0-No pain   Pain Goal:                   Kohler Pellerito

## 2022-10-23 ENCOUNTER — Ambulatory Visit: Payer: Medicare Other | Admitting: Hematology & Oncology

## 2022-10-23 ENCOUNTER — Inpatient Hospital Stay: Payer: Medicare Other

## 2022-10-25 ENCOUNTER — Encounter: Payer: Self-pay | Admitting: Cardiovascular Disease

## 2022-10-26 NOTE — Telephone Encounter (Signed)
Pt reports quarter size lump at his right groin. Denies pain, redness or bleeding from  the site. There is some edema.  Discussed w/ Dr. Myles Gip. Pt advised to continue monitoring for now and notify office if gets larger and/or pain occurs. Patient verbalized understanding and agreeable to plan.

## 2022-11-17 ENCOUNTER — Encounter (HOSPITAL_COMMUNITY): Payer: Self-pay | Admitting: Physician Assistant

## 2022-11-17 ENCOUNTER — Ambulatory Visit (HOSPITAL_COMMUNITY)
Admission: RE | Admit: 2022-11-17 | Discharge: 2022-11-17 | Disposition: A | Discharge status: Home / Self Care | Payer: Medicare Other | Source: Ambulatory Visit | Attending: Physician Assistant | Admitting: Physician Assistant

## 2022-11-17 VITALS — BP 98/70 | HR 70 | Ht 72.0 in | Wt 170.0 lb

## 2022-11-17 DIAGNOSIS — I480 Paroxysmal atrial fibrillation: Secondary | ICD-10-CM

## 2022-11-17 DIAGNOSIS — Z7984 Long term (current) use of oral hypoglycemic drugs: Secondary | ICD-10-CM | POA: Diagnosis not present

## 2022-11-17 DIAGNOSIS — D6869 Other thrombophilia: Secondary | ICD-10-CM | POA: Diagnosis not present

## 2022-11-17 DIAGNOSIS — Z79899 Other long term (current) drug therapy: Secondary | ICD-10-CM | POA: Insufficient documentation

## 2022-11-17 DIAGNOSIS — Z7901 Long term (current) use of anticoagulants: Secondary | ICD-10-CM | POA: Diagnosis not present

## 2022-11-17 DIAGNOSIS — E118 Type 2 diabetes mellitus with unspecified complications: Secondary | ICD-10-CM | POA: Diagnosis not present

## 2022-11-17 NOTE — Progress Notes (Signed)
Primary Care Physician: Burnard Bunting, MD Primary Cardiologist: Dr Gwenlyn Found Primary Electrophysiologist: Dr Myles Gip Referring Physician: Dr Enrigue Catena is a 82 y.o. male with a history of CLL, DM, atrial fibrillation who presents for follow up in the [facility].  Patient is on Eliquis for a CHADS2VASC score of 3. He was hospitalized overnight 06/03/2022 after an episode of syncope felt related to Viagra use.  He felt well after using Viagra but then became acutely lightheaded while using the restroom at 1:30 AM.  He had a brief LOC with movement suspicious for seizure-like activity.  Given chronic anticoagulation with Eliquis, he underwent CT imaging that was negative for acute findings.  Echocardiogram that admission on 06/04/2022 revealed LVEF 55-60%, no RWMA, grade 1 DD, normal RV, no significant valvular disease. He had a 30 day event monitor placed which showed an episode of rapid afib with heart rate 170s. He was symptomatic with palpitations and lightheadedness. Over the past two years, he has had 4-5 afib episodes each year that can last 3-4 hours. Travel and dehydration seem to be a trigger for him.   Patient was referred to Dr Myles Gip and he underwent afib ablation 10/20/22.  On follow up today, patient reports that in the first two weeks post ablation he did have 3 episodes of afib but has not had any since. He denies any chest pain, swallowing pain, or groin issues.   Today, he denies symptoms of palpitations, chest pain, shortness of breath, orthopnea, PND, lower extremity edema, presyncope, syncope, snoring, daytime somnolence, bleeding, or neurologic sequela. The patient is tolerating medications without difficulties and is otherwise without complaint today.    Atrial Fibrillation Risk Factors:  he does not have symptoms or diagnosis of sleep apnea. he does not have a history of rheumatic fever.   he has a BMI of Body mass index is 23.06  kg/m.Marland Kitchen Filed Weights   11/17/22 1122  Weight: 77.1 kg    Family History  Problem Relation Age of Onset   Cancer Mother        Multiple myeloma   Colon cancer Mother    Heart attack Father        Died age 47   Stroke Father    Cancer Sister        Breast cancer   Heart attack Paternal Grandmother    Cancer Paternal Grandmother        Breast cancer   Stroke Paternal Grandfather    Esophageal cancer Neg Hx    Rectal cancer Neg Hx    Stomach cancer Neg Hx      Atrial Fibrillation Management history:  Previous antiarrhythmic drugs: none Previous cardioversions: none Previous ablations: 10/20/22 CHADS2VASC score: 3 Anticoagulation history: Eliquis   Past Medical History:  Diagnosis Date   Atrial fibrillation    paroxysmal; Normal Coronary Arteries on Cath Jan 2013   Cataract    CLL (chronic lymphoblastic leukemia) 09/03/2011   dx 1999   Diabetes mellitus,borderline 09/10/2011   Glaucoma    Goals of care, counseling/discussion 09/17/2021   Past Surgical History:  Procedure Laterality Date   ATRIAL FIBRILLATION ABLATION N/A 10/20/2022   Procedure: ATRIAL FIBRILLATION ABLATION;  Surgeon: Melida Quitter, MD;  Location: [facility];  Service: Cardiovascular;  Laterality: N/A;   CARDIOVASCULAR STRESS TEST  09/15/2011   Mild-moderate perfusion defect due to infarct/scar w/ moderate perinfarct ischemia seen in basal inferoseptal, basal inferior, mid inferoseptal, mid inferior and  apical inferior regions. Observed defect may in part be attributable to diagphragmatic attenuation, however there is also reversibility that would suggest a true defect. EKG negative for ischemia.   CATARACT EXTRACTION  2012   COLONOSCOPY     CYST REMOVAL TRUNK  2010   back   GUM SURGERY  111/2021   for a crown   LEFT HEART CATHETERIZATION WITH CORONARY ANGIOGRAM N/A 09/17/2011   Procedure: LEFT HEART CATHETERIZATION WITH CORONARY ANGIOGRAM;  Surgeon: [name], MD;  Location[facility] CATH  LAB: Normal Coronary Arteries. EF ~60%. No RWMA   ROTATOR CUFF REPAIR  1999   TONSILLECTOMY     TRANSTHORACIC ECHOCARDIOGRAM  09/15/2011; Dec 2014   EF >55%, Normal LV systolic function; EF 123456. GR 1 DD. No RWMA.  Borderline left atrial dilation. Otherwise normal    Current Outpatient Medications  Medication Sig Dispense Refill   ACCU-CHEK AVIVA PLUS test strip 1 each daily.     brimonidine (ALPHAGAN) 0.2 % ophthalmic solution Place 1 drop into the left eye 2 (two) times daily.      diltiazem (CARDIZEM) 30 MG tablet Take 1 tablet every 4 hours AS NEEDED for AFIB heart rate >100 as long as top BP >100. 30 tablet 1   ELIQUIS 5 MG TABS tablet TAKE 1 TABLET BY MOUTH TWICE  DAILY 180 tablet 3   EPINEPHrine 0.3 mg/0.3 mL IJ SOAJ injection Inject 0.3 mg into the muscle as needed for anaphylaxis.     ketoconazole (NIZORAL) 2 % cream Apply 1 Application topically daily as needed for irritation (anti-fungal).     levothyroxine (SYNTHROID) 25 MCG tablet Take 25 mcg by mouth daily before breakfast.     metFORMIN (GLUCOPHAGE-XR) 500 MG 24 hr tablet Take 500 mg by mouth at bedtime.     metoprolol succinate (TOPROL-XL) 25 MG 24 hr tablet TAKE 1 TABLET BY MOUTH DAILY 90 tablet 3   pantoprazole (PROTONIX) 40 MG tablet Take 1 tablet (40 mg total) by mouth daily. 45 tablet 0   Probiotic Product (PROBIOTIC PO) Take 1 capsule by mouth at bedtime.     timolol (TIMOPTIC) 0.5 % ophthalmic solution Place 1 drop into the left eye daily.     colchicine 0.6 MG tablet Take 1 tablet (0.6 mg total) by mouth 2 (two) times daily for 5 days. 10 tablet 0   No current facility-administered medications for this encounter.    Allergies  Allergen Reactions   Nsaids Other (See Comments)    Told to avoid all NSAIDs while taking Eliquis   Shellfish Allergy Anaphylaxis and Swelling   Adhesive [Tape] Rash and Other (See Comments)    Redness Irritation    Latex Rash    tape    Social History   Socioeconomic History    Marital status: Married    Spouse name: Not on file   Number of children: 1   Years of education: Not on file   Highest education level: Not on file  Occupational History    Employer: RETIRED  Tobacco Use   Smoking status: Former    Packs/day: 1.00    Years: 3.00    Additional pack years: 0.00    Total pack years: 3.00    Types: Cigarettes    Start date: 09/19/1962    Quit date: 12/17/1965    Years since quitting: 56.9   Smokeless tobacco: Never   Tobacco comments:    Former smoker 11/17/22  Vaping Use   Vaping Use: Never used  Substance  and Sexual Activity   Alcohol use: Yes    Alcohol/week: 5.0 standard drinks of alcohol    Types: 5 Glasses of wine per week    Comment: 5 glasses of wine weekly 06/29/22   Drug use: No   Sexual activity: Yes  Other Topics Concern   Not on file  Social History Narrative   Married Caucasian male, father to 1 stepchild, grandfather to 2 step-grandchildren.   Lives with his wife.   Travels a lot & enjoys photography.   Social Determinants of Health   Financial Resource Strain: Not on file  Food Insecurity: Not on file  Transportation Needs: Not on file  Physical Activity: Not on file  Stress: Not on file  Social Connections: Not on file  Intimate Partner Violence: Not on file     ROS- All systems are reviewed and negative except as per the HPI above.  Physical Exam: Vitals:   11/17/22 1122  BP: 98/70  Pulse: 70  Weight: 77.1 kg  Height: 6' (1.829 m)    GEN- The patient is a well appearing elderly male, alert and oriented x 3 today.   HEENT-head normocephalic, atraumatic, sclera clear, conjunctiva pink, hearing intact, trachea midline. Lungs- Clear to ausculation bilaterally, normal work of breathing Heart- Regular rate and rhythm, no murmurs, rubs or gallops  GI- soft, NT, ND, + BS Extremities- no clubbing, cyanosis, or edema MS- no significant deformity or atrophy Skin- no rash or lesion Psych- euthymic mood, full  affect Neuro- strength and sensation are intact   Wt Readings from Last 3 Encounters:  11/17/22 77.1 kg  10/20/22 74.8 kg  10/12/22 75.8 kg    EKG today demonstrates  SR Vent. rate 70 BPM PR interval 176 ms QRS duration 96 ms QT/QTcB 386/416 ms  Echo 06/04/22 demonstrated   1. Technically difficult due to poor sound wave transmission.   2. Left ventricular ejection fraction, by estimation, is 55 to 60%. The  left ventricle has normal function. The left ventricle has no regional  wall motion abnormalities. There is mild concentric left ventricular  hypertrophy. Left ventricular diastolic parameters are consistent with Grade I diastolic dysfunction (impaired relaxation).   3. Right ventricular systolic function is normal. The right ventricular  size is normal.   4. The mitral valve is normal in structure. No evidence of mitral valve  regurgitation. No evidence of mitral stenosis.   5. The aortic valve was not well visualized. Aortic valve regurgitation  is not visualized. No aortic stenosis is present.   6. Pulmonic valve regurgitation Not assessed.   Epic records are reviewed at length today  CHA2DS2-VASc Score = 3  The patient's score is based upon: CHF History: 0 HTN History: 0 Diabetes History: 1 Stroke History: 0 Vascular Disease History: 0 Age Score: 2 Gender Score: 0       ASSESSMENT AND PLAN: 1. Paroxysmal Atrial Fibrillation (ICD10:  I48.0) The patient's CHA2DS2-VASc score is 3, indicating a 3.2% annual risk of stroke.   S/p afib ablation 10/20/22 Patient did have 3 episodes of afib in the first two weeks post ablation, has done well since. Continue diltiazem 30 mg PRN q 4 hours for heart racing. Continue Toprol 25 mg daily Continue Eliquis 5 mg BID with no missed doses for 3 months post ablation.   2. Secondary Hypercoagulable State (ICD10:  D68.69) The patient is at significant risk for stroke/thromboembolism based upon his CHA2DS2-VASc Score of 3.   Continue Apixaban (Eliquis).     Follow  up with Dr Myles Gip as scheduled.    [facility] [address] [place], [place] [address] [phone] 11/17/2022 11:52 AM

## 2022-11-23 ENCOUNTER — Other Ambulatory Visit: Payer: Self-pay | Admitting: Internal Medicine

## 2022-11-23 DIAGNOSIS — R160 Hepatomegaly, not elsewhere classified: Secondary | ICD-10-CM

## 2022-12-22 ENCOUNTER — Other Ambulatory Visit: Payer: Medicare Other

## 2022-12-22 ENCOUNTER — Ambulatory Visit
Admission: RE | Admit: 2022-12-22 | Discharge: 2022-12-22 | Disposition: A | Discharge status: Home / Self Care | Payer: Medicare Other | Source: Ambulatory Visit | Attending: Internal Medicine | Admitting: Internal Medicine

## 2022-12-22 DIAGNOSIS — R160 Hepatomegaly, not elsewhere classified: Secondary | ICD-10-CM

## 2022-12-22 MED ORDER — GADOPICLENOL 0.5 MMOL/ML IV SOLN
7.5000 mL | Freq: Once | INTRAVENOUS | Status: AC | PRN
  Administered 2022-12-22: 7.5 mL via INTRAVENOUS

## 2022-12-31 ENCOUNTER — Other Ambulatory Visit: Payer: Self-pay | Admitting: Internal Medicine

## 2022-12-31 DIAGNOSIS — R935 Abnormal findings on diagnostic imaging of other abdominal regions, including retroperitoneum: Secondary | ICD-10-CM

## 2022-12-31 DIAGNOSIS — K769 Liver disease, unspecified: Secondary | ICD-10-CM

## 2023-01-07 ENCOUNTER — Other Ambulatory Visit: Payer: Self-pay | Admitting: Cardiovascular Disease

## 2023-01-07 NOTE — Telephone Encounter (Signed)
Prescription refill request for Eliquis received. Indication: afib  Last office visit: [place], 11/17/2022 Scr: 0.99, 10/12/2022 Age: 82 yo  Weight: 77.1 kg   Refill sent.

## 2023-01-12 ENCOUNTER — Ambulatory Visit: Payer: Medicare Other | Attending: Physician Assistant | Admitting: Physician Assistant

## 2023-01-12 ENCOUNTER — Encounter: Payer: Self-pay | Admitting: Physician Assistant

## 2023-01-12 VITALS — BP 112/60 | HR 62 | Ht 72.0 in | Wt 167.0 lb

## 2023-01-12 DIAGNOSIS — I480 Paroxysmal atrial fibrillation: Secondary | ICD-10-CM

## 2023-01-12 DIAGNOSIS — R55 Syncope and collapse: Secondary | ICD-10-CM

## 2023-01-12 DIAGNOSIS — D6869 Other thrombophilia: Secondary | ICD-10-CM | POA: Diagnosis not present

## 2023-01-12 MED ORDER — METOPROLOL SUCCINATE ER 25 MG PO TB24: 37.5000 mg | ORAL_TABLET | Freq: Every day | ORAL | 2 refills | Status: DC

## 2023-01-12 NOTE — Patient Instructions (Addendum)
Medication Instructions:    START TAKING:  METOPROLOL 37.5 MG ONCE A DAY    *If you need a refill on your cardiac medications before your next appointment, please call your pharmacy*   Lab Work: NONE ORDERED  TODAY  If you have labs (blood work) drawn today and your tests are completely normal, you will receive your results only by: MyChart Message (if you have MyChart) OR A paper copy in the mail If you have any lab test that is abnormal or we need to change your treatment, we will call you to review the results.   Testing/Procedures:  NONE ORDERED  TODAY     Follow-Up: At [facility], you and your health needs are our priority.  As part of our continuing mission to provide you with exceptional heart care, we have created designated Provider Care Teams.  These Care Teams include your primary Cardiologist (physician) and Advanced Practice Providers (APPs -  Physician Assistants and Nurse Practitioners) who all work together to provide you with the care you need, when you need it.  We recommend signing up for the patient portal called "MyChart".  Sign up information is provided on this After Visit Summary.  MyChart is used to connect with patients for Virtual Visits (Telemedicine).  Patients are able to view lab/test results, encounter notes, upcoming appointments, etc.  Non-urgent messages can be sent to your provider as well.   To learn more about what you can do with MyChart, go to [email or web address].    Your next appointment:   AS SCHEDULED   Provider:    Nanetta Batty, MD    Other Instructions

## 2023-01-12 NOTE — Progress Notes (Signed)
Office Visit    Patient Name: Lucas [name] Date of Encounter: 01/12/2023  PCP:  Geoffry Paradise, MD   [facility]  Cardiologist:  Nanetta Batty, MD  Advanced Practice Provider:  No care team member to display Electrophysiologist:  Maurice Small, MD   HPI    Lucas [name] is a 82 y.o. male with a past medical history of CLL, DM, atrial fibrillation presents today for follow-up appointment.   He was last seen by the atrial fibrillation clinic April 2024 and at that time was on Eliquis for CHA2DS2-VASc score of 3.  Hospitalized 06/03/2022 overnight for an episode of syncope felt to be related to Viagra use.  After using Viagra became acutely lightheaded while using the restroom at 1:30 AM.  Had a brief LOC with movement suspicious for seizure-like activity.  Given chronic anticoagulation with Eliquis, he underwent CT imaging which was negative for acute findings.  Echocardiogram that admission 06/04/2022 revealed LVEF 55 to 60%, no RWMA, grade 1 DD, normal RV, no significant valvular disease.  He had a 30-day event monitor placed which showed an episode of rapid A-fib with heart rates 170s.  Was symptomatic with palpitations and lightheadedness.  Over the past 2 years, had 4-5 episodes of atrial fibrillation each year that last 3 to 4 hours.  Travel and dehydration seem to be the triggers for him.  Patient was referred to Dr. Nelly Laurence and he underwent atrial fibrillation ablation 10/20/2022.  On follow-up, patient reported first 2 weeks post ablation he did have 3 episodes of A-fib but none since.  Denied chest pain, swallowing pain, or groin issues.  When he was last seen a month ago by the atrial fibrillation clinic he denies symptoms of palpitations, chest pain, shortness of breath, orthopnea, PND, lower extremity edema, presyncope, syncope, snoring, daytime somnolence, bleeding, or neurologic sequelae.  Tolerating medications.  Today, he tells me that  nightly.  This morning he felt like he had a pulse.  He frequently checks his smart watch.  Also stated that his pulse was 121 at 2 PM today.  His wife informed me that that was when the shower stopped up and he was trying to unclog the drain.  Likely exerting himself.  He has never needed to use the Cardizem.  Dr. Nelly Laurence advised him to take an extra metoprolol if he ever feels like his heart rhythm.  He has not needed this.  Compliant with his Eliquis.  He states he had about 3 bouts of atrial fibrillation since his ablation but none recently before today.  His EKG was reviewed today which shows normal sinus rhythm.  No extra beats heard on auscultation.  He did have a history of PACs found on previous heart monitor (follow-up last year).  He states that he typically has dizziness/lightheadedness with atrial fibrillation but he has not felt this way.  Reports no shortness of breath nor dyspnea on exertion. Reports no chest pain, pressure, or tightness. No edema, orthopnea, PND.   Past Medical History    Past Medical History:  Diagnosis Date   Atrial fibrillation (HCC)    paroxysmal; Normal Coronary Arteries on Cath Jan 2013   Cataract    CLL (chronic lymphoblastic leukemia) 09/03/2011   dx 1999   Diabetes mellitus,borderline 09/10/2011   Glaucoma    Goals of care, counseling/discussion 09/17/2021   Past Surgical History:  Procedure Laterality Date   ATRIAL FIBRILLATION ABLATION N/A 10/20/2022   Procedure: ATRIAL FIBRILLATION ABLATION;  Surgeon: Maurice Small, MD;  Location: [facility] INVASIVE CV LAB;  Service: Cardiovascular;  Laterality: N/A;   CARDIOVASCULAR STRESS TEST  09/15/2011   Mild-moderate perfusion defect due to infarct/scar w/ moderate perinfarct ischemia seen in basal inferoseptal, basal inferior, mid inferoseptal, mid inferior and apical inferior regions. Observed defect may in part be attributable to diagphragmatic attenuation, however there is also reversibility that would suggest a  true defect. EKG negative for ischemia.   CATARACT EXTRACTION  2012   COLONOSCOPY     CYST REMOVAL TRUNK  2010   back   GUM SURGERY  111/2021   for a crown   LEFT HEART CATHETERIZATION WITH CORONARY ANGIOGRAM N/A 09/17/2011   Procedure: LEFT HEART CATHETERIZATION WITH CORONARY ANGIOGRAM;  Surgeon: Runell Gess, MD;  Location: [facility] CATH LAB: Normal Coronary Arteries. EF ~60%. No RWMA   ROTATOR CUFF REPAIR  1999   TONSILLECTOMY     TRANSTHORACIC ECHOCARDIOGRAM  09/15/2011; Dec 2014   EF >55%, Normal LV systolic function; EF 60-65%. GR 1 DD. No RWMA.  Borderline left atrial dilation. Otherwise normal    Allergies  Allergies  Allergen Reactions   Nsaids Other (See Comments)    Told to avoid all NSAIDs while taking Eliquis   Shellfish Allergy Anaphylaxis and Swelling   Adhesive [Tape] Rash and Other (See Comments)    Redness Irritation    Latex Rash    tape    EKGs/Labs/Other Studies Reviewed:   The following studies were reviewed today: Cardiac Studies & Procedures     STRESS TESTS  NM MYOCAR MULTI W/SPECT W 07/21/2013   ECHOCARDIOGRAM  ECHOCARDIOGRAM COMPLETE 06/04/2022  Narrative ECHOCARDIOGRAM REPORT    Patient Name:   Lucas [name] Date of Exam: 06/04/2022 Medical Rec #:  [id number]         Height:       72.0 in Accession #:    [id number]        Weight:       167.8 lb Date of Birth:  [birth year]        BSA:          1.977 m Patient Age:    81 years          BP:           120/65 mmHg Patient Gender: M                 HR:           64 bpm. Exam Location:  Inpatient  Procedure: 2D Echo, Color Doppler and Cardiac Doppler  Indications:    Syncope  History:        Patient has prior history of Echocardiogram examinations, most recent 07/18/2013. Arrythmias:Atrial Fibrillation; Risk Factors:Diabetes.  Sonographer:    Gaynell Face Referring Phys: [id number] Lucas [name]  IMPRESSIONS   1. Technically difficult due to poor sound wave transmission. 2. Left  ventricular ejection fraction, by estimation, is 55 to 60%. The left ventricle has normal function. The left ventricle has no regional wall motion abnormalities. There is mild concentric left ventricular hypertrophy. Left ventricular diastolic parameters are consistent with Grade I diastolic dysfunction (impaired relaxation). 3. Right ventricular systolic function is normal. The right ventricular size is normal. 4. The mitral valve is normal in structure. No evidence of mitral valve regurgitation. No evidence of mitral stenosis. 5. The aortic valve was not well visualized. Aortic valve regurgitation is not visualized. No aortic stenosis is present. 6. Pulmonic valve regurgitation Not assessed.  Comparison(s): No prior Echocardiogram.  FINDINGS Left Ventricle: Left ventricular ejection fraction, by estimation, is 55 to 60%. The left ventricle has normal function. The left ventricle has no regional wall motion abnormalities. The left ventricular internal cavity size was normal in size. There is mild concentric left ventricular hypertrophy. Left ventricular diastolic parameters are consistent with Grade I diastolic dysfunction (impaired relaxation).  Right Ventricle: The right ventricular size is normal. Right ventricular systolic function is normal.  Left Atrium: Left atrial size was normal in size.  Right Atrium: Right atrial size was normal in size.  Pericardium: There is no evidence of pericardial effusion.  Mitral Valve: The mitral valve is normal in structure. No evidence of mitral valve regurgitation. No evidence of mitral valve stenosis.  Tricuspid Valve: The tricuspid valve is not well visualized. Tricuspid valve regurgitation is not demonstrated. No evidence of tricuspid stenosis.  Aortic Valve: The aortic valve was not well visualized. Aortic valve regurgitation is not visualized. No aortic stenosis is present. Aortic valve mean gradient measures 3.0 mmHg. Aortic valve peak gradient  measures 4.4 mmHg. Aortic valve area, by VTI measures 2.26 cm.  Pulmonic Valve: The pulmonic valve was not well visualized. Pulmonic valve regurgitation Not assessed.  Aorta: The aortic root is normal in size and structure.  Venous: The inferior vena cava was not well visualized.  IAS/Shunts: The interatrial septum was not well visualized.  Additional Comments: Technically difficult due to poor sound wave transmission.   LEFT VENTRICLE PLAX 2D LVIDd:         3.00 cm   Diastology LVIDs:         2.10 cm   LV e' medial:    5.98 cm/s LV PW:         1.20 cm   LV E/e' medial:  8.8 LV IVS:        1.20 cm   LV e' lateral:   10.20 cm/s LVOT diam:     2.00 cm   LV E/e' lateral: 5.2 LV SV:         56 LV SV Index:   28 LVOT Area:     3.14 cm   RIGHT VENTRICLE TAPSE (M-mode): 1.8 cm  LEFT ATRIUM             Index        RIGHT ATRIUM           Index LA Vol (A2C):   50.6 ml 25.59 ml/m  RA Area:     10.70 cm LA Vol (A4C):   47.8 ml 24.17 ml/m  RA Volume:   21.00 ml  10.62 ml/m LA Biplane Vol: 49.6 ml 25.08 ml/m AORTIC VALVE AV Area (Vmax):    2.32 cm AV Area (Vmean):   2.38 cm AV Area (VTI):     2.26 cm AV Vmax:           105.00 cm/s AV Vmean:          75.500 cm/s AV VTI:            0.247 m AV Peak Grad:      4.4 mmHg AV Mean Grad:      3.0 mmHg LVOT Vmax:         77.70 cm/s LVOT Vmean:        57.100 cm/s LVOT VTI:          0.178 m LVOT/AV VTI ratio: 0.72  AORTA Ao Root diam: 3.50 cm  MITRAL VALVE MV Area (PHT): 3.91 cm  SHUNTS MV Decel Time: 194 msec    Systemic VTI:  0.18 m MV E velocity: 52.70 cm/s  Systemic Diam: 2.00 cm MV A velocity: 72.00 cm/s MV E/A ratio:  0.73  Olga Millers MD Electronically signed by Olga Millers MD Signature Date/Time: 06/04/2022/12:33:11 PM    Final    MONITORS  CARDIAC EVENT MONITOR 06/23/2022            EKG:  EKG is  ordered today.  The ekg ordered today demonstrates normal sinus rhythm, rate 62 bpm  Recent  Labs: 06/05/2022: Magnesium 2.0 10/12/2022: ALT 11; BUN 15; Creatinine 0.99; Hemoglobin 15.0; Platelet Count 96; Potassium 5.1; Sodium 138  Recent Lipid Panel    Component Value Date/Time   CHOL 168 09/10/2011 0420   TRIG 84 09/10/2011 0420   HDL 44 09/10/2011 0420   CHOLHDL 3.8 09/10/2011 0420   VLDL 17 09/10/2011 0420   LDLCALC 107 (H) 09/10/2011 0420    Risk Assessment/Calculations:   CHA2DS2-VASc Score = 3   This indicates a 3.2% annual risk of stroke. The patient's score is based upon: CHF History: 0 HTN History: 0 Diabetes History: 1 Stroke History: 0 Vascular Disease History: 0 Age Score: 2 Gender Score: 0    Home Medications   Current Meds  Medication Sig   ACCU-CHEK AVIVA PLUS test strip 1 each daily.   apixaban (ELIQUIS) 5 MG TABS tablet TAKE 1 TABLET BY MOUTH TWICE  DAILY   brimonidine (ALPHAGAN) 0.2 % ophthalmic solution Place 1 drop into the left eye 2 (two) times daily.    diltiazem (CARDIZEM) 30 MG tablet Take 1 tablet every 4 hours AS NEEDED for AFIB heart rate >100 as long as top BP >100.   EPINEPHrine 0.3 mg/0.3 mL IJ SOAJ injection Inject 0.3 mg into the muscle as needed for anaphylaxis.   ketoconazole (NIZORAL) 2 % cream Apply 1 Application topically daily as needed for irritation (anti-fungal).   levothyroxine (SYNTHROID) 25 MCG tablet Take 25 mcg by mouth daily before breakfast.   metFORMIN (GLUCOPHAGE-XR) 500 MG 24 hr tablet Take 500 mg by mouth at bedtime.   Probiotic Product (PROBIOTIC PO) Take 1 capsule by mouth at bedtime.   timolol (TIMOPTIC) 0.5 % ophthalmic solution Place 1 drop into the left eye daily.   [DISCONTINUED] metoprolol succinate (TOPROL-XL) 25 MG 24 hr tablet TAKE 1 TABLET BY MOUTH DAILY     Review of Systems      All other systems reviewed and are otherwise negative except as noted above.  Physical Exam    VS:  BP 112/60   Pulse 62   Ht 6' (1.829 m)   Wt 167 lb (75.8 kg)   SpO2 97%   BMI 22.65 kg/m  , BMI Body mass  index is 22.65 kg/m.  Wt Readings from Last 3 Encounters:  01/12/23 167 lb (75.8 kg)  11/17/22 170 lb (77.1 kg)  10/20/22 165 lb (74.8 kg)     GEN: Well nourished, well developed, in no acute distress. HEENT: normal. Neck: Supple, no JVD, carotid bruits, or masses. Cardiac: RRR, no murmurs, rubs, or gallops. No clubbing, cyanosis, edema.  Radials/PT 2+ and equal bilaterally.  Respiratory:  Respirations regular and unlabored, clear to auscultation bilaterally. GI: Soft, nontender, nondistended. MS: No deformity or atrophy. Skin: Warm and dry, no rash. Neuro:  Strength and sensation are intact. Psych: Normal affect.  Assessment & Plan    paroxysmal atrial fibrillation s/p Ablation -Metoprolol increased to 37.5 mg daily -Continue Eliquis 5 mg  twice a day -Has not needed to take his as needed Cardizem -Discussed proper hydration and maintaining 64 ounces of water a day -Discussed limiting caffeine   Secondary  hypercoagulable state -On Eliquis 5 mg twice a day, no issues with bleeding        Disposition: Follow up 1 month  with Nanetta Batty, MD or APP.  Signed, Sharlene Dory, PA-C 01/12/2023, 4:07 PM [facility]

## 2023-01-18 ENCOUNTER — Encounter: Payer: Self-pay | Admitting: Cardiovascular Disease

## 2023-01-18 ENCOUNTER — Ambulatory Visit: Payer: Medicare Other | Attending: Cardiovascular Disease | Admitting: Cardiovascular Disease

## 2023-01-18 ENCOUNTER — Ambulatory Visit: Payer: Medicare Other | Attending: Cardiovascular Disease

## 2023-01-18 VITALS — BP 108/66 | HR 62 | Ht 72.0 in | Wt 166.4 lb

## 2023-01-18 DIAGNOSIS — I493 Ventricular premature depolarization: Secondary | ICD-10-CM | POA: Diagnosis not present

## 2023-01-18 NOTE — Progress Notes (Unsigned)
Enrolled for [employer] to mail a ZIO XT long term holter monitor to the patients address on file.  

## 2023-01-18 NOTE — Progress Notes (Signed)
Electrophysiology Office Note:    Date:  01/18/2023   ID:  FINCH PFUHL, DOB 1941/05/04, MRN [id number]  PCP:  Geoffry Paradise, MD   [facility] Providers Cardiologist:  Nanetta Batty, MD Electrophysiologist:  Maurice Small, MD     Referring MD: Geoffry Paradise, MD   History of Present Illness:    Lucas [name] is a 82 y.o. male with a hx listed below, significant for paroxysmal atrial fibrillation, referred for arrhythmia management.  He was diagnosed with AF in about 2014 and has had a few ER visits with [facility] in the past. He is more facile now with controlling his rate with an extra dose of metoprolol and has not needed the ER in several years.  In the past 2 years, he has had 4 or 5 AF episodes per year.  He recently wore a monitor that showed AF with variable rates, up to 160 bpm.  He is symptomatic and knows instantly when he goes into atrial fibrillation. Exacerbations are often attributable to dehydration and have occurred with travel and outdoor activity.  He underwent AF ablation in March, 2024.  He had a few episodes of atrial fibrillation in the 2 weeks following his ablation, but no recurrence since.  In the past week, he has noticed some skipped beats, particularly bad on 1 day.  There were no EKG findings to suggest the cause.  Today, there is a single PVC on his twelve-lead EKG.  Today, he is doing well.  No recent palpitations, chest pain, syncope, presyncope.   Past Medical History:  Diagnosis Date   Atrial fibrillation (HCC)    paroxysmal; Normal Coronary Arteries on Cath Jan 2013   Cataract    CLL (chronic lymphoblastic leukemia) 09/03/2011   dx 1999   Diabetes mellitus,borderline 09/10/2011   Glaucoma    Goals of care, counseling/discussion 09/17/2021    Past Surgical History:  Procedure Laterality Date   ATRIAL FIBRILLATION ABLATION N/A 10/20/2022   Procedure: ATRIAL FIBRILLATION ABLATION;  Surgeon: Maurice Small, MD;  Location: [facility]  INVASIVE CV LAB;  Service: Cardiovascular;  Laterality: N/A;   CARDIOVASCULAR STRESS TEST  09/15/2011   Mild-moderate perfusion defect due to infarct/scar w/ moderate perinfarct ischemia seen in basal inferoseptal, basal inferior, mid inferoseptal, mid inferior and apical inferior regions. Observed defect may in part be attributable to diagphragmatic attenuation, however there is also reversibility that would suggest a true defect. EKG negative for ischemia.   CATARACT EXTRACTION  2012   COLONOSCOPY     CYST REMOVAL TRUNK  2010   back   GUM SURGERY  111/2021   for a crown   LEFT HEART CATHETERIZATION WITH CORONARY ANGIOGRAM N/A 09/17/2011   Procedure: LEFT HEART CATHETERIZATION WITH CORONARY ANGIOGRAM;  Surgeon: Runell Gess, MD;  Location: [facility] CATH LAB: Normal Coronary Arteries. EF ~60%. No RWMA   ROTATOR CUFF REPAIR  1999   TONSILLECTOMY     TRANSTHORACIC ECHOCARDIOGRAM  09/15/2011; Dec 2014   EF >55%, Normal LV systolic function; EF 60-65%. GR 1 DD. No RWMA.  Borderline left atrial dilation. Otherwise normal    Current Medications: Current Meds  Medication Sig   ACCU-CHEK AVIVA PLUS test strip 1 each daily.   apixaban (ELIQUIS) 5 MG TABS tablet TAKE 1 TABLET BY MOUTH TWICE  DAILY   brimonidine (ALPHAGAN) 0.2 % ophthalmic solution Place 1 drop into the left eye 2 (two) times daily.    diltiazem (CARDIZEM) 30 MG tablet Take 1 tablet every 4  hours AS NEEDED for AFIB heart rate >100 as long as top BP >100.   EPINEPHrine 0.3 mg/0.3 mL IJ SOAJ injection Inject 0.3 mg into the muscle as needed for anaphylaxis.   ketoconazole (NIZORAL) 2 % cream Apply 1 Application topically daily as needed for irritation (anti-fungal).   levothyroxine (SYNTHROID) 25 MCG tablet Take 25 mcg by mouth daily before breakfast.   metFORMIN (GLUCOPHAGE-XR) 500 MG 24 hr tablet Take 500 mg by mouth at bedtime.   metoprolol succinate (TOPROL-XL) 25 MG 24 hr tablet Take 1.5 tablets (37.5 mg total) by mouth daily.    Probiotic Product (PROBIOTIC PO) Take 1 capsule by mouth at bedtime.   timolol (TIMOPTIC) 0.5 % ophthalmic solution Place 1 drop into the left eye daily.     Allergies:   Nsaids, Shellfish allergy, Adhesive [tape], and Latex   Social History   Socioeconomic History   Marital status: Married    Spouse name: Not on file   Number of children: 1   Years of education: Not on file   Highest education level: Not on file  Occupational History    Employer: RETIRED  Tobacco Use   Smoking status: Former    Packs/day: 1.00    Years: 3.00    Additional pack years: 0.00    Total pack years: 3.00    Types: Cigarettes    Start date: 09/19/1962    Quit date: 12/17/1965    Years since quitting: 57.1   Smokeless tobacco: Never   Tobacco comments:    Former smoker 11/17/22  Vaping Use   Vaping Use: Never used  Substance and Sexual Activity   Alcohol use: Yes    Alcohol/week: 5.0 standard drinks of alcohol    Types: 5 Glasses of wine per week    Comment: 5 glasses of wine weekly 06/29/22   Drug use: No   Sexual activity: Yes  Other Topics Concern   Not on file  Social History Narrative   Married Caucasian male, father to 1 stepchild, grandfather to 2 step-grandchildren.   Lives with his wife.   Travels a lot & enjoys photography.   Social Determinants of Health   Financial Resource Strain: Not on file  Food Insecurity: Not on file  Transportation Needs: Not on file  Physical Activity: Not on file  Stress: Not on file  Social Connections: Not on file     Family History: The patient's family history includes Cancer in his mother, paternal grandmother, and sister; Colon cancer in his mother; Heart attack in his father and paternal grandmother; Stroke in his father and paternal grandfather. There is no history of Esophageal cancer, Rectal cancer, or Stomach cancer.  ROS:   Please see the history of present illness.    All other systems reviewed and are negative.  EKGs/Labs/Other  Studies Reviewed Today:     I personally reviewed strips from the cardiac monitor of 07/28/2022: There is AF with uncontrolled rates.  EKG:  Last EKG results: today - sinus rhythm, single PVC, rate 66 bpm   Recent Labs: 06/05/2022: Magnesium 2.0 10/12/2022: ALT 11; BUN 15; Creatinine 0.99; Hemoglobin 15.0; Platelet Count 96; Potassium 5.1; Sodium 138     Physical Exam:    VS:  BP 108/66   Pulse 62   Ht 6' (1.829 m)   Wt 166 lb 6.4 oz (75.5 kg)   SpO2 98%   BMI 22.57 kg/m     Wt Readings from Last 3 Encounters:  01/18/23 166 lb 6.4 oz (  75.5 kg)  01/12/23 167 lb (75.8 kg)  11/17/22 170 lb (77.1 kg)     GEN:  Well nourished, well developed in no acute distress CARDIAC: RRR, no murmurs, rubs, gallops RESPIRATORY:  Normal work of breathing MUSCULOSKELETAL: no edema    ASSESSMENT & PLAN:    Paroxysmal atrial fibrillation: Prior to the ablation, he was very symptomatic and having multiple events per year.  He has not had any recurrence of atrial fibrillation after the ablation with the exception of 3 nonsustained episodes within the first 2 weeks following the ablation Secondary hypercoagulable state: continue eliquis PVCs: Suspect these are the cause of his skipped beat symptoms of recent.  Will place 3-day monitor to quantify burden        Medication Adjustments/Labs and Tests Ordered: Current medicines are reviewed at length with the patient today.  Concerns regarding medicines are outlined above.  No orders of the defined types were placed in this encounter.  No orders of the defined types were placed in this encounter.    Signed, Maurice Small, MD  01/18/2023 12:16 PM    [facility]

## 2023-01-18 NOTE — Patient Instructions (Signed)
Medication Instructions:  Your physician recommends that you continue on your current medications as directed. Please refer to the Current Medication list given to you today. *If you need a refill on your cardiac medications before your next appointment, please call your pharmacy*   Testing/Procedures: Zio Cardiac Monitor Your physician has recommended that you wear an event monitor. Event monitors are medical devices that record the heart's electrical activity. Doctors most often [place] these monitors to diagnose arrhythmias. Arrhythmias are problems with the speed or rhythm of the heartbeat. The monitor is a small, portable device. You can wear one while you do your normal daily activities. This is usually used to diagnose what is causing palpitations/syncope (passing out).    Follow-Up: At [facility], you and your health needs are our priority.  As part of our continuing mission to provide you with exceptional heart care, we have created designated Provider Care Teams.  These Care Teams include your primary Cardiologist (physician) and Advanced Practice Providers (APPs -  Physician Assistants and Nurse Practitioners) who all work together to provide you with the care you need, when you need it.  We recommend signing up for the patient portal called "MyChart".  Sign up information is provided on this After Visit Summary.  MyChart is used to connect with patients for Virtual Visits (Telemedicine).  Patients are able to view lab/test results, encounter notes, upcoming appointments, etc.  Non-urgent messages can be sent to your provider as well.   To learn more about what you can do with MyChart, go to [email or web address].    Your next appointment:   6-8 week(s)  Provider:   York Pellant, MD   Christena Deem- Long Term Monitor Instructions  Your physician has requested you wear a ZIO patch monitor for 14 days.  This is a single patch monitor. [employer] supplies one patch monitor per  enrollment. Additional stickers are not available. Please do not apply patch if you will be having a Nuclear Stress Test,  Echocardiogram, Cardiac CT, MRI, or Chest Xray during the period you would be wearing the  monitor. The patch cannot be worn during these tests. You cannot remove and re-apply the  ZIO XT patch monitor.  Your ZIO patch monitor will be mailed 3 day USPS to your address on file. It may take 3-5 days  to receive your monitor after you have been enrolled.  Once you have received your monitor, please review the enclosed instructions. Your monitor  has already been registered assigning a specific monitor serial # to you.  Billing and Patient Assistance Program Information  We have supplied [employer] with any of your insurance information on file for billing purposes. [employer] offers a sliding scale Patient Assistance Program for patients that do not have  insurance, or whose insurance does not completely cover the cost of the ZIO monitor.  You must apply for the Patient Assistance Program to qualify for this discounted rate.  To apply, please call [employer] at [phone], select option 4, select option 2, ask to apply for  Patient Assistance Program. Meredeth Ide will ask your household income, and how many people  are in your household. They will quote your out-of-pocket cost based on that information.  [employer] will also be able to set up a 39-month, interest-free payment plan if needed.  Applying the monitor   Shave hair from upper left chest.  Hold abrader disc by orange tab. Rub abrader in 40 strokes over the upper left chest as  indicated in your monitor  instructions.  Clean area with 4 enclosed alcohol pads. Let dry.  Apply patch as indicated in monitor instructions. Patch will be placed under collarbone on left  side of chest with arrow pointing upward.  Rub patch adhesive wings for 2 minutes. Remove white label marked "1". Remove the white  label marked "2". Rub patch  adhesive wings for 2 additional minutes.  While looking in a mirror, press and release button in center of patch. A small green light will  flash 3-4 times. This will be your only indicator that the monitor has been turned on.  Do not shower for the first 24 hours. You may shower after the first 24 hours.  Press the button if you feel a symptom. You will hear a small click. Record Date, Time and  Symptom in the Patient Logbook.  When you are ready to remove the patch, follow instructions on the last 2 pages of Patient  Logbook. Stick patch monitor onto the last page of Patient Logbook.  Place Patient Logbook in the blue and white box. Use locking tab on box and tape box closed  securely. The blue and white box has prepaid postage on it. Please place it in the mailbox as  soon as possible. Your physician should have your test results approximately 7 days after the  monitor has been mailed back to [facility].  Call [facility] Customer Care at [phone] if you have questions regarding  your ZIO XT patch monitor. Call them immediately if you see an orange light blinking on your  monitor.  If your monitor falls off in less than 4 days, contact our Monitor department at [phone].  If your monitor becomes loose or falls off after 4 days call [employer] at [phone] for  suggestions on securing your monitor

## 2023-01-21 DIAGNOSIS — I493 Ventricular premature depolarization: Secondary | ICD-10-CM | POA: Diagnosis not present

## 2023-01-23 ENCOUNTER — Other Ambulatory Visit: Payer: Self-pay | Admitting: Cardiovascular Disease

## 2023-02-03 ENCOUNTER — Encounter: Payer: Self-pay | Admitting: Cardiovascular Disease

## 2023-02-03 ENCOUNTER — Ambulatory Visit: Payer: Medicare Other | Attending: Cardiovascular Disease | Admitting: Cardiovascular Disease

## 2023-02-03 VITALS — BP 120/60 | HR 56 | Ht 72.0 in | Wt 167.0 lb

## 2023-02-03 DIAGNOSIS — I480 Paroxysmal atrial fibrillation: Secondary | ICD-10-CM | POA: Diagnosis not present

## 2023-02-03 NOTE — Progress Notes (Signed)
Think about the result     02/03/2023 Lucas [name]   11/14/1940  [id number]  Primary Physician Geoffry Paradise, MD Primary Cardiologist: Runell Gess MD FACP, [place], [place], [place]  HPI:  [name] is a 82 y.o.  thin and fit-appearing, married Caucasian male, father to 1 stepchild, grandfather to 2 step-grandchildren who I last saw in the office 07/31/2022.  He is accompanied by his wife Vernona Rieger today.  He had diagnostic cath performed by me in January after a positive Myoview that revealed normal coronary arteries, normal LV function suggesting a false positive test. He has PAF with RVR in the past with atypical chest pain. He also has CLL which is quiescent followed by Dr. Arlan Organ. He did have a 3-week photo shoot safari in [place] in the [place] after I cath'd him and has taken [employer]. He is otherwise active and asymptomatic. His most recent lab work performed in January revealed total cholesterol 168, LDL 107, HDL 44. He was admitted to [facility] ER on 06/27/13 by Dr. Antoine Poche for A. Fib. Plans were to convert him with oral flecainide however he converted on his own. He was seen in the [facility] ER by Dr. Royann Shivers. Since I saw him in the office one year ago he has had one episode of PAF in March of 2015 while he was in [place]. His travel plans in the upcoming future included [place], [place], [place]. [place] in [place] in May followed by [place] in September.  he was recently in [place] and had an episode of PAF on July 23 well on a train and a dining car. His most recent episode was on 04/13/17 after being woken up from a nap with rapid heart rate. This took 5 hours to spontaneously convert.    His travels continued to  [place] and [place] as well as [place] and [place] in August.  2019 .   Unfortunately, because of COVID-19 his traveling has been put on hold although he is still active.  He continues to play golf and pickleball.  He had arthroscopic surgery on  his right knee in June for torn meniscus.    He did travel to [place] since I saw him last to [place] in [place] and is planning to leave this coming Friday to [place] and [place].  He is completely asymptomatic.  Did have 5 episodes of A-fib over the last year the last 1 being 08/14/2021 lasting for several hours, and resolving spontaneously.  He is on Eliquis oral anticoagulation.   He had an episode of syncope and was evaluated in the emergency room 06/04/2022.  He had a 2D echo which was essentially normal and an event monitor performed 06/15/2022 that showed runs of A-fib with RVR associated with dizziness.  He .  He traveled to [place], [place] in [place] on January 7 and is scheduled [place] and [place] in July.  He had A-fib ablation 10/20/2022 and had several short episodes of A-fib in the weeks following that but none since.  He did wear a Holter monitor.  Otherwise he is very active and denies chest pain or shortness of breath.   Current Meds  Medication Sig   ACCU-CHEK AVIVA PLUS test strip 1 each daily.   apixaban (ELIQUIS) 5 MG TABS tablet TAKE 1 TABLET BY MOUTH TWICE  DAILY   brimonidine (ALPHAGAN) 0.2 % ophthalmic solution Place 1 drop into the left eye 2 (two) times daily.    diltiazem (CARDIZEM) 30 MG tablet Take  1 tablet every 4 hours AS NEEDED for AFIB heart rate >100 as long as top BP >100.   EPINEPHrine 0.3 mg/0.3 mL IJ SOAJ injection Inject 0.3 mg into the muscle as needed for anaphylaxis.   ketoconazole (NIZORAL) 2 % cream Apply 1 Application topically daily as needed for irritation (anti-fungal).   levothyroxine (SYNTHROID) 25 MCG tablet Take 25 mcg by mouth daily before breakfast.   metFORMIN (GLUCOPHAGE-XR) 500 MG 24 hr tablet Take 500 mg by mouth at bedtime.   metoprolol succinate (TOPROL-XL) 25 MG 24 hr tablet Take 1.5 tablets (37.5 mg total) by mouth daily.   Probiotic Product (PROBIOTIC PO) Take 1 capsule by mouth at bedtime.   timolol  (TIMOPTIC) 0.5 % ophthalmic solution Place 1 drop into the left eye daily.     Allergies  Allergen Reactions   Nsaids Other (See Comments)    Told to avoid all NSAIDs while taking Eliquis   Shellfish Allergy Anaphylaxis and Swelling   Adhesive [Tape] Rash and Other (See Comments)    Redness Irritation    Latex Rash    tape    Social History   Socioeconomic History   Marital status: Married    Spouse name: Not on file   Number of children: 1   Years of education: Not on file   Highest education level: Not on file  Occupational History    Employer: RETIRED  Tobacco Use   Smoking status: Former    Packs/day: 1.00    Years: 3.00    Additional pack years: 0.00    Total pack years: 3.00    Types: Cigarettes    Start date: 09/19/1962    Quit date: 12/17/1965    Years since quitting: 57.1   Smokeless tobacco: Never   Tobacco comments:    Former smoker 11/17/22  Vaping Use   Vaping Use: Never used  Substance and Sexual Activity   Alcohol use: Yes    Alcohol/week: 5.0 standard drinks of alcohol    Types: 5 Glasses of wine per week    Comment: 5 glasses of wine weekly 06/29/22   Drug use: No   Sexual activity: Yes  Other Topics Concern   Not on file  Social History Narrative   Married Caucasian male, father to 1 stepchild, grandfather to 2 step-grandchildren.   Lives with his wife.   Travels a lot & enjoys photography.   Social Determinants of Health   Financial Resource Strain: Not on file  Food Insecurity: Not on file  Transportation Needs: Not on file  Physical Activity: Not on file  Stress: Not on file  Social Connections: Not on file  Intimate Partner Violence: Not on file     Review of Systems: General: negative for chills, fever, night sweats or weight changes.  Cardiovascular: negative for chest pain, dyspnea on exertion, edema, orthopnea, palpitations, paroxysmal nocturnal dyspnea or shortness of breath Dermatological: negative for rash Respiratory:  negative for cough or wheezing Urologic: negative for hematuria Abdominal: negative for nausea, vomiting, diarrhea, bright red blood per rectum, melena, or hematemesis Neurologic: negative for visual changes, syncope, or dizziness All other systems reviewed and are otherwise negative except as noted above.    Blood pressure 120/60, pulse (!) 56, height 6' (1.829 m), weight 167 lb (75.8 kg).  General appearance: alert and no distress Neck: no adenopathy, no carotid bruit, no JVD, supple, symmetrical, trachea midline, and thyroid not enlarged, symmetric, no tenderness/mass/nodules Lungs: clear to auscultation bilaterally  EKG not performed today  ASSESSMENT AND PLAN:   Paroxysmal atrial fibrillation (HCC) History of PAF status post A-fib ablation 10/20/2022 by Dr. Nelly Laurence   He remains on Eliquis anticoagulation.  He had several episodes in the weeks following ablation but none since.  He did wear a Holter monitor.  His beta-blocker was adjusted.     Runell Gess MD FACP,FACC,FAHA, [facility] 02/03/2023 10:11 AM

## 2023-02-03 NOTE — Assessment & Plan Note (Signed)
History of PAF status post A-fib ablation 10/20/2022 by Dr. Nelly Laurence   He remains on Eliquis anticoagulation.  He had several episodes in the weeks following ablation but none since.  He did wear a Holter monitor.  His beta-blocker was adjusted.

## 2023-02-03 NOTE — Patient Instructions (Signed)
Medication Instructions:  Your physician recommends that you continue on your current medications as directed. Please refer to the Current Medication list given to you today.  *If you need a refill on your cardiac medications before your next appointment, please call your pharmacy*   Follow-Up: At [facility], you and your health needs are our priority.  As part of our continuing mission to provide you with exceptional heart care, we have created designated Provider Care Teams.  These Care Teams include your primary Cardiologist (physician) and Advanced Practice Providers (APPs -  Physician Assistants and Nurse Practitioners) who all work together to provide you with the care you need, when you need it.  We recommend signing up for the patient portal called "MyChart".  Sign up information is provided on this After Visit Summary.  MyChart is used to connect with patients for Virtual Visits (Telemedicine).  Patients are able to view lab/test results, encounter notes, upcoming appointments, etc.  Non-urgent messages can be sent to your provider as well.   To learn more about what you can do with MyChart, go to [email or web address].    Your next appointment:   12 month(s)  Provider:   Jonathan Berry, MD    

## 2023-02-15 ENCOUNTER — Encounter: Payer: Self-pay | Admitting: Cardiovascular Disease

## 2023-03-08 ENCOUNTER — Ambulatory Visit: Payer: Medicare Other | Attending: Cardiovascular Disease | Admitting: Cardiovascular Disease

## 2023-03-08 ENCOUNTER — Encounter: Payer: Self-pay | Admitting: Cardiovascular Disease

## 2023-03-08 VITALS — BP 128/70 | HR 69 | Ht 72.0 in | Wt 166.8 lb

## 2023-03-08 DIAGNOSIS — I480 Paroxysmal atrial fibrillation: Secondary | ICD-10-CM | POA: Diagnosis not present

## 2023-03-08 NOTE — Progress Notes (Signed)
Electrophysiology Office Note:    Date:  03/08/2023   ID:  Lucas [name], DOB Feb 11, 1941, MRN [id number]  PCP:  Geoffry Paradise, MD    HeartCare Providers Cardiologist:  Nanetta Batty, MD Electrophysiologist:  Maurice Small, MD     Referring MD: Geoffry Paradise, MD   History of Present Illness:    Lucas [name] is a 82 y.o. male with a hx listed below, significant for paroxysmal atrial fibrillation, referred for arrhythmia management.  He was diagnosed with AF in about 2014 and has had a few ER visits with RVR in the past. He is more facile now with controlling his rate with an extra dose of metoprolol and has not needed the ER in several years.  In the past 2 years, he has had 4 or 5 AF episodes per year.  He recently wore a monitor that showed AF with variable rates, up to 160 bpm.  He is symptomatic and knows instantly when he goes into atrial fibrillation. Exacerbations are often attributable to dehydration and have occurred with travel and outdoor activity.  He underwent AF ablation in March, 2024.  He had a few episodes of atrial fibrillation in the 2 weeks following his ablation, but no recurrence since.  In the past week, he has noticed some skipped beats, particularly bad on 1 day.  There were no EKG findings to suggest the cause.  Today, there is a single PVC on his twelve-lead EKG.  Today, he is doing well.  No recent palpitations, chest pain, syncope, presyncope.   Past Medical History:  Diagnosis Date   Atrial fibrillation (HCC)    paroxysmal; Normal Coronary Arteries on Cath Jan 2013   Cataract    CLL (chronic lymphoblastic leukemia) 09/03/2011   dx 1999   Diabetes mellitus,borderline 09/10/2011   Glaucoma    Goals of care, counseling/discussion 09/17/2021    Past Surgical History:  Procedure Laterality Date   ATRIAL FIBRILLATION ABLATION N/A 10/20/2022   Procedure: ATRIAL FIBRILLATION ABLATION;  Surgeon: Maurice Small, MD;  Location: [facility]  INVASIVE CV LAB;  Service: Cardiovascular;  Laterality: N/A;   CARDIOVASCULAR STRESS TEST  09/15/2011   Mild-moderate perfusion defect due to infarct/scar w/ moderate perinfarct ischemia seen in basal inferoseptal, basal inferior, mid inferoseptal, mid inferior and apical inferior regions. Observed defect may in part be attributable to diagphragmatic attenuation, however there is also reversibility that would suggest a true defect. EKG negative for ischemia.   CATARACT EXTRACTION  2012   COLONOSCOPY     CYST REMOVAL TRUNK  2010   back   GUM SURGERY  111/2021   for a crown   LEFT HEART CATHETERIZATION WITH CORONARY ANGIOGRAM N/A 09/17/2011   Procedure: LEFT HEART CATHETERIZATION WITH CORONARY ANGIOGRAM;  Surgeon: Runell Gess, MD;  Location: [facility] CATH LAB: Normal Coronary Arteries. EF ~60%. No RWMA   ROTATOR CUFF REPAIR  1999   TONSILLECTOMY     TRANSTHORACIC ECHOCARDIOGRAM  09/15/2011; Dec 2014   EF >55%, Normal LV systolic function; EF 60-65%. GR 1 DD. No RWMA.  Borderline left atrial dilation. Otherwise normal    Current Medications: Current Meds  Medication Sig   ACCU-CHEK AVIVA PLUS test strip 1 each daily.   apixaban (ELIQUIS) 5 MG TABS tablet TAKE 1 TABLET BY MOUTH TWICE  DAILY   brimonidine (ALPHAGAN) 0.2 % ophthalmic solution Place 1 drop into the left eye 2 (two) times daily.    EPINEPHrine 0.3 mg/0.3 mL IJ SOAJ injection Inject 0.3 mg  into the muscle as needed for anaphylaxis.   ketoconazole (NIZORAL) 2 % cream Apply 1 Application topically daily as needed for irritation (anti-fungal).   levothyroxine (SYNTHROID) 25 MCG tablet Take 25 mcg by mouth daily before breakfast.   metFORMIN (GLUCOPHAGE-XR) 500 MG 24 hr tablet Take 500 mg by mouth at bedtime.   metoprolol succinate (TOPROL-XL) 25 MG 24 hr tablet Take 1.5 tablets (37.5 mg total) by mouth daily.   Probiotic Product (PROBIOTIC PO) Take 1 capsule by mouth at bedtime.   timolol (TIMOPTIC) 0.5 % ophthalmic solution Place 1  drop into the left eye daily.     Allergies:   Nsaids, Shellfish allergy, Adhesive [tape], and Latex   Social History   Socioeconomic History   Marital status: Married    Spouse name: Not on file   Number of children: 1   Years of education: Not on file   Highest education level: Not on file  Occupational History    Employer: RETIRED  Tobacco Use   Smoking status: Former    Current packs/day: 0.00    Average packs/day: 1 pack/day for 3.2 years (3.2 ttl pk-yrs)    Types: Cigarettes    Start date: 09/19/1962    Quit date: 12/17/1965    Years since quitting: 57.2   Smokeless tobacco: Never   Tobacco comments:    Former smoker 11/17/22  Vaping Use   Vaping status: Never Used  Substance and Sexual Activity   Alcohol use: Yes    Alcohol/week: 5.0 standard drinks of alcohol    Types: 5 Glasses of wine per week    Comment: 5 glasses of wine weekly 06/29/22   Drug use: No   Sexual activity: Yes  Other Topics Concern   Not on file  Social History Narrative   Married Caucasian male, father to 1 stepchild, grandfather to 2 step-grandchildren.   Lives with his wife.   Travels a lot & enjoys photography.   Social Determinants of Health   Financial Resource Strain: Not on file  Food Insecurity: Not on file  Transportation Needs: Not on file  Physical Activity: Not on file  Stress: Not on file  Social Connections: Not on file     Family History: The patient's family history includes Cancer in his mother, paternal grandmother, and sister; Colon cancer in his mother; Heart attack in his father and paternal grandmother; Stroke in his father and paternal grandfather. There is no history of Esophageal cancer, Rectal cancer, or Stomach cancer.  ROS:   Please see the history of present illness.    All other systems reviewed and are negative.  EKGs/Labs/Other Studies Reviewed Today:     I personally reviewed strips from the cardiac monitor of 07/28/2022: There is AF with  uncontrolled rates.  EKG:  Last EKG results: today - sinus rhythm, single PVC, rate 66 bpm   Recent Labs: 06/05/2022: Magnesium 2.0 10/12/2022: ALT 11; BUN 15; Creatinine 0.99; Hemoglobin 15.0; Platelet Count 96; Potassium 5.1; Sodium 138     Physical Exam:    VS:  BP 128/70   Pulse 69   Ht 6' (1.829 m)   Wt 166 lb 12.8 oz (75.7 kg)   SpO2 97%   BMI 22.62 kg/m     Wt Readings from Last 3 Encounters:  03/08/23 166 lb 12.8 oz (75.7 kg)  02/03/23 167 lb (75.8 kg)  01/18/23 166 lb 6.4 oz (75.5 kg)     GEN:  Well nourished, well developed in no acute distress CARDIAC:  RRR, no murmurs, rubs, gallops RESPIRATORY:  Normal work of breathing MUSCULOSKELETAL: no edema    ASSESSMENT & PLAN:    Paroxysmal atrial fibrillation:  Prior to the ablation, he was very symptomatic and having multiple events per year.   He has not had any recurrence of atrial fibrillation after the ablation with the exception of 3 nonsustained episodes within the first 2 weeks following the ablation  Secondary hypercoagulable state: continue eliquis  PVCs: Essentially no PVCs on monitor        Medication Adjustments/Labs and Tests Ordered: Current medicines are reviewed at length with the patient today.  Concerns regarding medicines are outlined above.  Orders Placed This Encounter  Procedures   EKG 12-Lead   No orders of the defined types were placed in this encounter.    Signed, Maurice Small, MD  03/08/2023 3:04 PM    [facility]

## 2023-03-08 NOTE — Patient Instructions (Signed)

## 2023-04-12 ENCOUNTER — Inpatient Hospital Stay: Payer: Medicare Other | Attending: Hematology & Oncology

## 2023-04-12 ENCOUNTER — Encounter: Payer: Self-pay | Admitting: Hematology & Oncology

## 2023-04-12 ENCOUNTER — Inpatient Hospital Stay (HOSPITAL_BASED_OUTPATIENT_CLINIC_OR_DEPARTMENT_OTHER): Payer: Medicare Other | Admitting: Hematology & Oncology

## 2023-04-12 VITALS — BP 139/76 | HR 54 | Temp 97.7°F | Resp 20 | Ht 72.0 in | Wt 165.0 lb

## 2023-04-12 DIAGNOSIS — C9110 Chronic lymphocytic leukemia of B-cell type not having achieved remission: Secondary | ICD-10-CM | POA: Insufficient documentation

## 2023-04-12 DIAGNOSIS — Z7901 Long term (current) use of anticoagulants: Secondary | ICD-10-CM | POA: Insufficient documentation

## 2023-04-12 LAB — CBC WITH DIFFERENTIAL (CANCER CENTER ONLY)
Abs Immature Granulocytes: 0.11 10*3/uL — ABNORMAL HIGH (ref 0.00–0.07)
Basophils Absolute: 0 10*3/uL (ref 0.0–0.1)
Basophils Relative: 0 %
Eosinophils Absolute: 0 10*3/uL (ref 0.0–0.5)
Eosinophils Relative: 0 %
HCT: 44.3 % (ref 39.0–52.0)
Hemoglobin: 14.1 g/dL (ref 13.0–17.0)
Immature Granulocytes: 1 %
Lymphocytes Relative: 74 %
Lymphs Abs: 13 10*3/uL — ABNORMAL HIGH (ref 0.7–4.0)
MCH: 29.4 pg (ref 26.0–34.0)
MCHC: 31.8 g/dL (ref 30.0–36.0)
MCV: 92.5 fL (ref 80.0–100.0)
Monocytes Absolute: 0.5 10*3/uL (ref 0.1–1.0)
Monocytes Relative: 3 %
Neutro Abs: 3.8 10*3/uL (ref 1.7–7.7)
Neutrophils Relative %: 22 %
Platelet Count: 87 10*3/uL — ABNORMAL LOW (ref 150–400)
RBC: 4.79 MIL/uL (ref 4.22–5.81)
RDW: 13.7 % (ref 11.5–15.5)
Smear Review: NORMAL
WBC Count: 17.5 10*3/uL — ABNORMAL HIGH (ref 4.0–10.5)
nRBC: 0 % (ref 0.0–0.2)

## 2023-04-12 LAB — CMP (CANCER CENTER ONLY)
ALT: 6 U/L (ref 0–44)
AST: 11 U/L — ABNORMAL LOW (ref 15–41)
Albumin: 4.3 g/dL (ref 3.5–5.0)
Alkaline Phosphatase: 70 U/L (ref 38–126)
Anion gap: 5 (ref 5–15)
BUN: 15 mg/dL (ref 8–23)
CO2: 31 mmol/L (ref 22–32)
Calcium: 9.4 mg/dL (ref 8.9–10.3)
Chloride: 97 mmol/L — ABNORMAL LOW (ref 98–111)
Creatinine: 1 mg/dL (ref 0.61–1.24)
GFR, Estimated: >60 mL/min (ref >60)
Glucose, Bld: 156 mg/dL — ABNORMAL HIGH (ref 70–99)
Potassium: 5.1 mmol/L (ref 3.5–5.1)
Sodium: 133 mmol/L — ABNORMAL LOW (ref 135–145)
Total Bilirubin: 0.8 mg/dL (ref 0.3–1.2)
Total Protein: 7 g/dL (ref 6.5–8.1)

## 2023-04-12 LAB — LACTATE DEHYDROGENASE: LDH: 113 U/L (ref 98–192)

## 2023-04-12 LAB — SAVE SMEAR(SSMR), FOR PROVIDER SLIDE REVIEW

## 2023-04-12 NOTE — Progress Notes (Signed)
Hematology and Oncology Follow Up Visit  Lucas [name] [id number] 05/08/1941 82 y.o. 04/12/2023   Principle Diagnosis:  1. Chronic lymphocytic leukemia - stage C  Current Therapy:   Observation    Interim History:  Lucas [name] is here today for a follow-up.  He and his wife were on a [place] safari.  That a wonderful time over there.  They went to 3 different countries.  I am so happy that they could do all of this.  Earlier in the year, they were in [place].  They done a lot of traveling.  Thankfully, his wife has done well given that she had a ruptured appendix when we last saw him.  His blood sugars have been doing pretty well.  He was a little bit worried about them when he got back from [place].  He has had no problems with infections.  He did take antimalarial pills while over in [place].  He has had no swollen lymph nodes.  He has had no cough.  He has had no nausea or vomiting.  He has had no change in bowel or bladder habits.  He has had no rashes.  Is been no leg swelling.  His cardiac situation is doing pretty well.  He is on Eliquis.  Overall, I would say that his performance status is ECOG 1.   Medications:  Allergies as of 04/12/2023       Reactions   Nsaids Other (See Comments)   Told to avoid all NSAIDs while taking Eliquis   Shellfish Allergy Anaphylaxis, Swelling   Adhesive [tape] Rash, Other (See Comments)   Redness Irritation    Latex Rash   tape        Medication List        Accurate as of April 12, 2023  9:44 AM. If you have any questions, ask your nurse or doctor.          Accu-Chek Aviva Plus test strip Generic drug: glucose blood 1 each daily.   Accu-Chek Softclix Lancets lancets USE AS DIRECTED TO TEST BLOOD SUGARS ONCE DAILY for 90 days   brimonidine 0.2 % ophthalmic solution Commonly known as: ALPHAGAN Place 1 drop into the left eye 2 (two) times daily.   Eliquis 5 MG Tabs tablet Generic drug: apixaban TAKE 1 TABLET  BY MOUTH TWICE  DAILY   EPINEPHrine 0.3 mg/0.3 mL Soaj injection Commonly known as: EPI-PEN Inject 0.3 mg into the muscle as needed for anaphylaxis.   ketoconazole 2 % cream Commonly known as: NIZORAL Apply 1 Application topically daily as needed for irritation (anti-fungal).   metFORMIN 500 MG 24 hr tablet Commonly known as: GLUCOPHAGE-XR Take 500 mg by mouth at bedtime.   metoprolol succinate 25 MG 24 hr tablet Commonly known as: TOPROL-XL Take 1.5 tablets (37.5 mg total) by mouth daily.   PROBIOTIC PO Take 1 capsule by mouth at bedtime.   Synthroid 25 MCG tablet Generic drug: levothyroxine Take 25 mcg by mouth daily before breakfast.   timolol 0.5 % ophthalmic solution Commonly known as: TIMOPTIC Place 1 drop into the left eye daily.        Allergies:  Allergies  Allergen Reactions   Nsaids Other (See Comments)    Told to avoid all NSAIDs while taking Eliquis   Shellfish Allergy Anaphylaxis and Swelling   Adhesive [Tape] Rash and Other (See Comments)    Redness Irritation    Latex Rash    tape    Past Medical History, Surgical  history, Social history, and Family History were reviewed and updated.  Review of Systems: Review of Systems  Constitutional: Negative.   HENT: Negative.    Eyes: Negative.   Respiratory: Negative.    Cardiovascular: Negative.   Gastrointestinal: Negative.   Genitourinary: Negative.   Musculoskeletal: Negative.   Skin: Negative.   Neurological: Negative.   Endo/Heme/Allergies: Negative.   Psychiatric/Behavioral: Negative.       Physical Exam:  height is 6' (1.829 m) and weight is 165 lb (74.8 kg). His oral temperature is 97.7 F (36.5 C). His blood pressure is 139/76 and his pulse is 54 (abnormal). His respiration is 20 and oxygen saturation is 98%.   Wt Readings from Last 3 Encounters:  04/12/23 165 lb (74.8 kg)  03/08/23 166 lb 12.8 oz (75.7 kg)  02/03/23 167 lb (75.8 kg)    Physical Exam Vitals reviewed.  HENT:      Head: Normocephalic and atraumatic.  Eyes:     Pupils: Pupils are equal, round, and reactive to light.  Cardiovascular:     Rate and Rhythm: Normal rate and regular rhythm.     Heart sounds: Normal heart sounds.  Pulmonary:     Effort: Pulmonary effort is normal.     Breath sounds: Normal breath sounds.  Abdominal:     General: Bowel sounds are normal.     Palpations: Abdomen is soft.  Musculoskeletal:        General: No tenderness or deformity. Normal range of motion.     Cervical back: Normal range of motion.  Lymphadenopathy:     Cervical: No cervical adenopathy.  Skin:    General: Skin is warm and dry.     Findings: No erythema or rash.  Neurological:     Mental Status: He is alert and oriented to person, place, and time.  Psychiatric:        Behavior: Behavior normal.        Thought Content: Thought content normal.        Judgment: Judgment normal.      Lab Results  Component Value Date   WBC 17.5 (H) 04/12/2023   HGB 14.1 04/12/2023   HCT 44.3 04/12/2023   MCV 92.5 04/12/2023   PLT 87 (L) 04/12/2023   No results found for: "FERRITIN", "IRON", "TIBC", "UIBC", "IRONPCTSAT" Lab Results  Component Value Date   RETICCTPCT 1.0 06/15/2013   RBC 4.79 04/12/2023   RETICCTABS 47.6 06/15/2013   Lab Results  Component Value Date   KPAFRELGTCHN 22.0 (H) 01/19/2020   LAMBDASER 10.7 01/19/2020   KAPLAMBRATIO 2.06 (H) 01/19/2020   Lab Results  Component Value Date   IGGSERUM 993 04/24/2022   IGA 108 04/24/2022   IGMSERUM 122 04/24/2022   Lab Results  Component Value Date   TOTALPROTELP 6.5 06/15/2013   ALBUMINELP 61.7 06/15/2013   A1GS 3.5 06/15/2013   A2GS 9.8 06/15/2013   BETS 5.3 06/15/2013   BETA2SER 8.7 (H) 06/15/2013   GAMS 11.0 (L) 06/15/2013   MSPIKE Not Observed 01/25/2017   SPEI * 06/15/2013     Chemistry      Component Value Date/Time   NA 133 (L) 04/12/2023 0828   NA 136 10/07/2022 0812   NA 142 06/28/2017 0855   K 5.1 04/12/2023 0828    K 4.5 06/28/2017 0855   CL 97 (L) 04/12/2023 0828   CL 99 06/28/2017 0855   CO2 31 04/12/2023 0828   CO2 32 06/28/2017 0855   BUN 15 04/12/2023 4782  BUN 15 10/07/2022 0812   BUN 15 06/28/2017 0855   CREATININE 1.00 04/12/2023 0828   CREATININE 1.0 06/28/2017 0855      Component Value Date/Time   CALCIUM 9.4 04/12/2023 0828   CALCIUM 9.2 06/28/2017 0855   ALKPHOS 70 04/12/2023 0828   ALKPHOS 74 06/28/2017 0855   AST 11 (L) 04/12/2023 0828   ALT 6 04/12/2023 0828   ALT 13 06/28/2017 0855   BILITOT 0.8 04/12/2023 0828     Impression and Plan: Mr. Cookson is very pleasant 82 year old male with CLL. We have not had to treat him now for over 21 years.   His platelet count is still on the lower side.  I know he is on Eliquis.  However, I do not think that the white cells are low enough that we have to treat him.  I find nothing on his physical exam that would suggest he needs treatment.  I am just so happy that he had a wonderful trip to [place].  He is not sure when the next trip will be for he and his wife.  I will plan to see him back in 6 months as always.   Josph Macho, MD 8/26/20249:44 AM

## 2023-04-13 ENCOUNTER — Encounter: Payer: Self-pay | Admitting: Hematology & Oncology

## 2023-04-13 LAB — BETA 2 MICROGLOBULIN, SERUM: Beta-2 Microglobulin: 2.1 mg/L (ref 0.6–2.4)

## 2023-07-08 ENCOUNTER — Other Ambulatory Visit: Payer: Medicare Other

## 2023-07-09 ENCOUNTER — Other Ambulatory Visit: Payer: Self-pay | Admitting: Cardiovascular Disease

## 2023-07-09 DIAGNOSIS — I480 Paroxysmal atrial fibrillation: Secondary | ICD-10-CM

## 2023-07-09 NOTE — Telephone Encounter (Signed)
Eliquis 5mg  refill request received. Patient is 82 years old, weight-74.8kg, Crea-1.00 on 04/12/23, Diagnosis-Afib, and last seen by Dr. Nelly Laurence on 03/08/23. Dose is appropriate based on dosing criteria. Will send in refill to requested pharmacy.

## 2023-08-17 ENCOUNTER — Ambulatory Visit
Admission: RE | Admit: 2023-08-17 | Discharge: 2023-08-17 | Disposition: A | Discharge status: Home / Self Care | Payer: Medicare Other | Source: Ambulatory Visit | Attending: Internal Medicine | Admitting: Internal Medicine

## 2023-08-17 DIAGNOSIS — K769 Liver disease, unspecified: Secondary | ICD-10-CM

## 2023-08-17 DIAGNOSIS — R935 Abnormal findings on diagnostic imaging of other abdominal regions, including retroperitoneum: Secondary | ICD-10-CM

## 2023-08-17 MED ORDER — GADOPICLENOL 0.5 MMOL/ML IV SOLN
7.0000 mL | Freq: Once | INTRAVENOUS | Status: AC | PRN
  Administered 2023-08-17: 7 mL via INTRAVENOUS

## 2023-08-27 ENCOUNTER — Encounter: Payer: Self-pay | Admitting: Hematology & Oncology

## 2023-08-27 ENCOUNTER — Encounter: Payer: Self-pay | Admitting: Cardiovascular Disease

## 2023-09-09 ENCOUNTER — Telehealth: Payer: Self-pay | Admitting: *Deleted

## 2023-09-09 NOTE — Telephone Encounter (Signed)
   Pre-operative Risk Assessment    Patient Name: Lucas [name]  DOB: February 07, 1941 MRN: [id number]   Date of last office visit: 03/08/23 DR. MEALOR Date of next office visit: NONE  Request for Surgical Clearance    Procedure:   OSSEOUS SURGERY (INVOLVES CUTTING GUMS AND TISSUE IN THE MOUTH)  (THIS WILL BE THE LOWER TEETH 18-22 TOOTH #)  Date of Surgery:  Clearance 10/06/23                                Surgeon:  DR. Elspeth Cho, DDS Surgeon's Group or Practice Name:  [facility] Cho, D[facility] Phone number:  [phone] Fax number:  [phone]   Type of Clearance Requested:   - Medical  - Pharmacy:  Hold Apixaban (Eliquis)     Type of Anesthesia:  Local    Additional requests/questions:    [name]   09/09/2023, 2:24 PM

## 2023-09-09 NOTE — Telephone Encounter (Signed)
Pharmacy please advise on holding Eliquis prior to osseous surgery INVOLVES CUTTING GUMS AND TISSUE IN THE MOUTH)  (THIS WILL BE THE LOWER TEETH 18-22 TOOTH #) scheduled for 10/06/2023. Thank you.

## 2023-09-10 ENCOUNTER — Telehealth: Payer: Self-pay | Admitting: *Deleted

## 2023-09-10 NOTE — Telephone Encounter (Signed)
    Patient Consent for Virtual Visit   MED REC AND CONSENT ARE DONE.Marland Kitchen SMO      [name] has provided verbal consent on 09/10/2023 for a virtual visit (video or telephone).   CONSENT FOR VIRTUAL VISIT FOR:  Lucas [name]  By participating in this virtual visit I agree to the following:  I hereby voluntarily request, consent and authorize [facility] and its employed or contracted physicians, physician assistants, nurse practitioners or other licensed health care professionals (the Practitioner), to provide me with telemedicine health care services (the "Services") as deemed necessary by the treating Practitioner. I acknowledge and consent to receive the Services by the Practitioner via telemedicine. I understand that the telemedicine visit will involve communicating with the Practitioner through live audiovisual communication technology and the disclosure of certain medical information by electronic transmission. I acknowledge that I have been given the opportunity to request an in-person assessment or other available alternative prior to the telemedicine visit and am voluntarily participating in the telemedicine visit.  I understand that I have the right to withhold or withdraw my consent to the use of telemedicine in the course of my care at any time, without affecting my right to future care or treatment, and that the Practitioner or I may terminate the telemedicine visit at any time. I understand that I have the right to inspect all information obtained and/or recorded in the course of the telemedicine visit and may receive copies of available information for a reasonable fee.  I understand that some of the potential risks of receiving the Services via telemedicine include:  Delay or interruption in medical evaluation due to technological equipment failure or disruption; Information transmitted may not be sufficient (e.g. poor resolution of images) to allow for appropriate  medical decision making by the Practitioner; and/or  In rare instances, security protocols could fail, causing a breach of personal health information.  Furthermore, I acknowledge that it is my responsibility to provide information about my medical history, conditions and care that is complete and accurate to the best of my ability. I acknowledge that Practitioner's advice, recommendations, and/or decision may be based on factors not within their control, such as incomplete or inaccurate data provided by me or distortions of diagnostic images or specimens that may result from electronic transmissions. I understand that the practice of medicine is not an exact science and that Practitioner makes no warranties or guarantees regarding treatment outcomes. I acknowledge that a copy of this consent can be made available to me via my patient portal [facility] MyChart), or I can request a printed copy by calling the office of [facility].    I understand that my insurance will be billed for this visit.   I have read or had this consent read to me. I understand the contents of this consent, which adequately explains the benefits and risks of the Services being provided via telemedicine.  I have been provided ample opportunity to ask questions regarding this consent and the Services and have had my questions answered to my satisfaction. I give my informed consent for the services to be provided through the use of telemedicine in my medical care

## 2023-09-10 NOTE — Telephone Encounter (Signed)
Patient with diagnosis of Afib on Eliquis for anticoagulation.    Procedure: OSSEOUS SURGERY (INVOLVES CUTTING GUMS AND TISSUE IN THE MOUTH)  (THIS WILL BE THE LOWER TEETH 18-22 TOOTH #)   Date of procedure: 10/06/2023   CHA2DS2-VASc Score = 3   This indicates a 3.2% annual risk of stroke. The patient's score is based upon: CHF History: 0 HTN History: 0 Diabetes History: 1 Stroke History: 0 Vascular Disease History: 0 Age Score: 2 Gender Score: 0     CrCl 60 mL/min Platelet count 87   Per office protocol, patient can hold Eliquis for 1-2 days prior to procedure.     **This guidance is not considered finalized until pre-operative APP has relayed final recommendations.**

## 2023-09-10 NOTE — Telephone Encounter (Signed)
Patient Scheduled 09-23-23

## 2023-09-10 NOTE — Telephone Encounter (Signed)
   Name: Lucas [name]  DOB: 09-26-1940  MRN: [id number]  Primary Cardiologist: Nanetta Batty, MD   Preoperative team, please contact this patient and set up a phone call appointment for further preoperative risk assessment. Please obtain consent and complete medication review. Thank you for your help.  I confirm that guidance regarding antiplatelet and oral anticoagulation therapy has been completed and, if necessary, noted below.   Per office protocol, patient can hold Eliquis for 1-2 days prior to procedure. Please resume Eliquis as soon as possible postprocedure, at the discretion of the surgeon.   I also confirmed the patient resides in the state of [place]. As per [facility] Medical Board telemedicine laws, the patient must reside in the state in which the provider is licensed.   Joylene Grapes, NP 09/10/2023, 8:48 AM [facility]

## 2023-09-23 ENCOUNTER — Ambulatory Visit: Payer: Medicare Other | Attending: Cardiovascular Disease | Admitting: Cardiology

## 2023-09-23 DIAGNOSIS — Z01810 Encounter for preprocedural cardiovascular examination: Secondary | ICD-10-CM

## 2023-09-23 NOTE — Progress Notes (Signed)
 Virtual Visit via Telephone Note   Because of KOLLIN UDELL co-morbid illnesses, he is at least at moderate risk for complications without adequate follow up.  This format is felt to be most appropriate for this patient at this time.  The patient did not have access to video technology/had technical difficulties with video requiring transitioning to audio format only (telephone).  All issues noted in this document were discussed and addressed.  No physical exam could be performed with this format.  Please refer to the patient's chart for his consent to telehealth for [facility].  Evaluation Performed:  Preoperative cardiovascular risk assessment _____________   Date:  09/23/2023   Patient ID:  Lucas [name], DOB 10-03-1940, MRN [id number] Patient Location:  Home Provider location:   Office  Primary Care Provider:  Shepard Ade, MD Primary Cardiologist:  Dorn Lesches, MD  Chief Complaint / Patient Profile  83 y.o. y/o male with a h/o paroxysmal atrial fibrillation s/p AF ablation in March, 2024, who is pending OSSEOUS SURGERY (INVOLVES CUTTING GUMS AND TISSUE IN THE MOUTH)  (THIS WILL BE THE LOWER TEETH 18-22 TOOTH #) with Dr. Richardson Daring and presents today for telephonic preoperative cardiovascular risk assessment. History of Present Illness   Lucas [name] is a 83 y.o. male who presents via audio/video conferencing for a telehealth visit today.  Pt was last seen in cardiology clinic on 03/08/2023 by Dr. Nancey.  At that time Lucas [name] was doing well. The patient is now pending procedure as outlined above. Since his last visit, he has done well from a cardiac standpoint. Today he denies chest pain, shortness of breath, lower extremity edema, fatigue, palpitations, melena, hematuria, hemoptysis, diaphoresis, weakness, presyncope, syncope, orthopnea, and PND.  Past Medical History    Past Medical History:  Diagnosis Date   Atrial fibrillation (HCC)     paroxysmal; Normal Coronary Arteries on Cath Jan 2013   Cataract    CLL (chronic lymphoblastic leukemia) 09/03/2011   dx 1999   Diabetes mellitus,borderline 09/10/2011   Glaucoma    Goals of care, counseling/discussion 09/17/2021   Past Surgical History:  Procedure Laterality Date   ATRIAL FIBRILLATION ABLATION N/A 10/20/2022   Procedure: ATRIAL FIBRILLATION ABLATION;  Surgeon: Nancey Eulas BRAVO, MD;  Location: [facility] INVASIVE CV LAB;  Service: Cardiovascular;  Laterality: N/A;   CARDIOVASCULAR STRESS TEST  09/15/2011   Mild-moderate perfusion defect due to infarct/scar w/ moderate perinfarct ischemia seen in basal inferoseptal, basal inferior, mid inferoseptal, mid inferior and apical inferior regions. Observed defect may in part be attributable to diagphragmatic attenuation, however there is also reversibility that would suggest a true defect. EKG negative for ischemia.   CATARACT EXTRACTION  2012   COLONOSCOPY     CYST REMOVAL TRUNK  2010   back   GUM SURGERY  111/2021   for a crown   LEFT HEART CATHETERIZATION WITH CORONARY ANGIOGRAM N/A 09/17/2011   Procedure: LEFT HEART CATHETERIZATION WITH CORONARY ANGIOGRAM;  Surgeon: Dorn JINNY Lesches, MD;  Location: [facility] CATH LAB: Normal Coronary Arteries. EF ~60%. No RWMA   ROTATOR CUFF REPAIR  1999   TONSILLECTOMY     TRANSTHORACIC ECHOCARDIOGRAM  09/15/2011; Dec 2014   EF >55%, Normal LV systolic function; EF 60-65%. GR 1 DD. No RWMA.  Borderline left atrial dilation. Otherwise normal    Allergies Allergies  Allergen Reactions   Nsaids Other (See Comments)    Told to avoid all NSAIDs while taking Eliquis    Shellfish Allergy Anaphylaxis  and Swelling   Adhesive [Tape] Rash and Other (See Comments)    Redness Irritation    Latex Rash    tape   Home Medications    Prior to Admission medications   Medication Sig Start Date End Date Taking? Authorizing Provider  ACCU-CHEK AVIVA PLUS test strip 1 each daily. 05/27/22   [provider]   Accu-Chek Softclix Lancets lancets USE AS DIRECTED TO TEST BLOOD SUGARS ONCE DAILY for 90 days    [provider]  apixaban  (ELIQUIS ) 5 MG TABS tablet TAKE 1 TABLET BY MOUTH TWICE  DAILY 07/09/23   Court Dorn PARAS, MD  brimonidine  (ALPHAGAN ) 0.2 % ophthalmic solution Place 1 drop into the left eye 2 (two) times daily.  08/05/11   [provider]  EPINEPHrine 0.3 mg/0.3 mL IJ SOAJ injection Inject 0.3 mg into the muscle as needed for anaphylaxis. 08/20/22   [provider]  ketoconazole (NIZORAL) 2 % cream Apply 1 Application topically daily as needed for irritation (anti-fungal). 07/23/20   [provider]  levothyroxine (SYNTHROID) 25 MCG tablet Take 25 mcg by mouth daily before breakfast. 06/17/22   [provider]  metFORMIN (GLUCOPHAGE-XR) 500 MG 24 hr tablet Take 500 mg by mouth at bedtime.    [provider]  metoprolol  succinate (TOPROL -XL) 25 MG 24 hr tablet Take 1.5 tablets (37.5 mg total) by mouth daily. 01/12/23   Lucien Orren SAILOR, PA-C  Probiotic Product (PROBIOTIC PO) Take 1 capsule by mouth at bedtime.    [provider]  terbinafine (LAMISIL) 250 MG tablet Take 250 mg by mouth daily. 09/02/23   [provider]  timolol  (TIMOPTIC ) 0.5 % ophthalmic solution Place 1 drop into the left eye daily. 06/25/11   [provider]    Physical Exam    Vital Signs:  Lucas [name] does not have vital signs available for review today.  Given telephonic nature of communication, physical exam is limited. AAOx3. NAD. Normal affect.  Speech and respirations are unlabored.  Accessory Clinical Findings    None  Assessment & Plan    1.  Preoperative Cardiovascular Risk Assessment: OSSEOUS SURGERY (INVOLVES CUTTING GUMS AND TISSUE IN THE MOUTH)  (THIS WILL BE THE LOWER TEETH 18-22 TOOTH #) with Dr. Richardson Daring  Lucas [name]'s perioperative risk of a major cardiac event is 0.4% according to the Revised Cardiac Risk  Index (RCRI).  Therefore, he is at low risk for perioperative complications.   His functional capacity is excellent at 8.27 METs according to the Duke Activity Status Index (DASI). Recommendations: According to ACC/AHA guidelines, no further cardiovascular testing needed.  The patient may proceed to surgery at acceptable risk.   Antiplatelet and/or Anticoagulation Recommendations: Per office protocol, patient can hold Eliquis  for 1-2 days prior to procedure. Please resume Eliquis  as soon as possible postprocedure, at the discretion of the surgeon.   The patient was advised that if he develops new symptoms prior to surgery to contact our office to arrange for a follow-up visit, and he verbalized understanding.  Patient will not require SBE prophylaxis from a cardiac standpoint.   A copy of this note will be routed to requesting surgeon.  Time:   Today, I have spent 10 minutes with the patient with telehealth technology discussing medical history, symptoms, and management plan.     Bennetta Rudden D Sachit Gilman, NP  09/23/2023, 10:28 AM

## 2023-10-06 ENCOUNTER — Encounter: Payer: Self-pay | Admitting: Cardiovascular Disease

## 2023-10-08 NOTE — Telephone Encounter (Signed)
If he's not symptomatic then I'm not worried about it Runell Gess,  , MD   10/07/23  3:19 PM

## 2023-10-13 ENCOUNTER — Inpatient Hospital Stay: Payer: Medicare Other

## 2023-10-13 ENCOUNTER — Other Ambulatory Visit: Payer: Self-pay

## 2023-10-13 ENCOUNTER — Encounter: Payer: Self-pay | Admitting: Hematology & Oncology

## 2023-10-13 ENCOUNTER — Inpatient Hospital Stay: Payer: Medicare Other | Attending: Hematology & Oncology | Admitting: Hematology & Oncology

## 2023-10-13 VITALS — BP 127/71 | HR 59 | Temp 97.6°F | Resp 18 | Ht 72.0 in | Wt 162.0 lb

## 2023-10-13 DIAGNOSIS — Z7901 Long term (current) use of anticoagulants: Secondary | ICD-10-CM | POA: Diagnosis not present

## 2023-10-13 DIAGNOSIS — C9110 Chronic lymphocytic leukemia of B-cell type not having achieved remission: Secondary | ICD-10-CM

## 2023-10-13 DIAGNOSIS — I4891 Unspecified atrial fibrillation: Secondary | ICD-10-CM | POA: Diagnosis not present

## 2023-10-13 LAB — CMP (CANCER CENTER ONLY)
ALT: 10 U/L (ref 0–44)
AST: 11 U/L — ABNORMAL LOW (ref 15–41)
Albumin: 4.7 g/dL (ref 3.5–5.0)
Alkaline Phosphatase: 66 U/L (ref 38–126)
Anion gap: 5 (ref 5–15)
BUN: 15 mg/dL (ref 8–23)
CO2: 34 mmol/L — ABNORMAL HIGH (ref 22–32)
Calcium: 10 mg/dL (ref 8.9–10.3)
Chloride: 97 mmol/L — ABNORMAL LOW (ref 98–111)
Creatinine: 0.97 mg/dL (ref 0.61–1.24)
GFR, Estimated: >60 mL/min (ref >60)
Glucose, Bld: 141 mg/dL — ABNORMAL HIGH (ref 70–99)
Potassium: 5 mmol/L (ref 3.5–5.1)
Sodium: 136 mmol/L (ref 135–145)
Total Bilirubin: 0.8 mg/dL (ref 0.0–1.2)
Total Protein: 7 g/dL (ref 6.5–8.1)

## 2023-10-13 LAB — CBC WITH DIFFERENTIAL (CANCER CENTER ONLY)
Abs Immature Granulocytes: 0.05 10*3/uL (ref 0.00–0.07)
Basophils Absolute: 0 10*3/uL (ref 0.0–0.1)
Basophils Relative: 0 %
Eosinophils Absolute: 0 10*3/uL (ref 0.0–0.5)
Eosinophils Relative: 0 %
HCT: 45.5 % (ref 39.0–52.0)
Hemoglobin: 14.8 g/dL (ref 13.0–17.0)
Immature Granulocytes: 1 %
Lymphocytes Relative: 69 %
Lymphs Abs: 7.4 10*3/uL — ABNORMAL HIGH (ref 0.7–4.0)
MCH: 29.8 pg (ref 26.0–34.0)
MCHC: 32.5 g/dL (ref 30.0–36.0)
MCV: 91.5 fL (ref 80.0–100.0)
Monocytes Absolute: 0.5 10*3/uL (ref 0.1–1.0)
Monocytes Relative: 4 %
Neutro Abs: 2.7 10*3/uL (ref 1.7–7.7)
Neutrophils Relative %: 26 %
Platelet Count: 83 10*3/uL — ABNORMAL LOW (ref 150–400)
RBC: 4.97 MIL/uL (ref 4.22–5.81)
RDW: 14.4 % (ref 11.5–15.5)
Smear Review: NORMAL
WBC Count: 10.7 10*3/uL — ABNORMAL HIGH (ref 4.0–10.5)
nRBC: 0 % (ref 0.0–0.2)

## 2023-10-13 LAB — LACTATE DEHYDROGENASE: LDH: 101 U/L (ref 98–192)

## 2023-10-13 LAB — SAVE SMEAR(SSMR), FOR PROVIDER SLIDE REVIEW

## 2023-10-13 NOTE — Progress Notes (Signed)
 Hematology and Oncology Follow Up Visit  Lucas [name] [id number] 08-19-1940 83 y.o. 10/13/2023   Principle Diagnosis:  1. Chronic lymphocytic leukemia - stage C  Current Therapy:   Observation    Interim History:  Lucas [name] is here today for a follow-up.  We see him every 6 months.  He is doing pretty well.  Really has had no complaints.  His atrial fibrillation is back in sinus rhythm now.  He continues on Eliquis.  He had no problems over the Holiday season.  Thankfully, his wife is doing okay.  He has had no problems with fever.  He has had no COVID issues.  Thankfully, his townhouse was not involved with the [facility] tragedy.  He has had no swollen lymph nodes.  He has had no mouth sores.  He has had no dysphagia or odynophagia.  There is been no change in bowel bladder habits.  Overall, I would say that his performance status is ECOG 1.   Medications:  Allergies as of 10/13/2023       Reactions   Nsaids Other (See Comments)   Told to avoid all NSAIDs while taking Eliquis   Shellfish Allergy Anaphylaxis, Swelling   Adhesive [tape] Rash, Other (See Comments)   Redness Irritation    Latex Rash   tape        Medication List        Accurate as of October 13, 2023  8:44 AM. If you have any questions, ask your nurse or doctor.          Accu-Chek Aviva Plus test strip Generic drug: glucose blood 1 each daily.   Accu-Chek Softclix Lancets lancets USE AS DIRECTED TO TEST BLOOD SUGARS ONCE DAILY for 90 days   brimonidine 0.2 % ophthalmic solution Commonly known as: ALPHAGAN Place 1 drop into the left eye 2 (two) times daily.   Eliquis 5 MG Tabs tablet Generic drug: apixaban TAKE 1 TABLET BY MOUTH TWICE  DAILY   EPINEPHrine 0.3 mg/0.3 mL Soaj injection Commonly known as: EPI-PEN Inject 0.3 mg into the muscle as needed for anaphylaxis.   ketoconazole 2 % cream Commonly known as: NIZORAL Apply 1 Application topically daily as needed  for irritation (anti-fungal).   metFORMIN 500 MG 24 hr tablet Commonly known as: GLUCOPHAGE-XR Take 500 mg by mouth at bedtime.   metoprolol succinate 25 MG 24 hr tablet Commonly known as: TOPROL-XL Take 1.5 tablets (37.5 mg total) by mouth daily.   PROBIOTIC PO Take 1 capsule by mouth at bedtime.   Synthroid 25 MCG tablet Generic drug: levothyroxine Take 25 mcg by mouth daily before breakfast.   terbinafine 250 MG tablet Commonly known as: LAMISIL Take 250 mg by mouth daily.   timolol 0.5 % ophthalmic solution Commonly known as: TIMOPTIC Place 1 drop into the left eye daily.        Allergies:  Allergies  Allergen Reactions   Nsaids Other (See Comments)    Told to avoid all NSAIDs while taking Eliquis   Shellfish Allergy Anaphylaxis and Swelling   Adhesive [Tape] Rash and Other (See Comments)    Redness Irritation    Latex Rash    tape    Past Medical History, Surgical history, Social history, and Family History were reviewed and updated.  Review of Systems: Review of Systems  Constitutional: Negative.   HENT: Negative.    Eyes: Negative.   Respiratory: Negative.    Cardiovascular: Negative.   Gastrointestinal: Negative.   Genitourinary: Negative.  Musculoskeletal: Negative.   Skin: Negative.   Neurological: Negative.   Endo/Heme/Allergies: Negative.   Psychiatric/Behavioral: Negative.       Physical Exam:  Temperature is 97.6.  Pulse 59.  Blood pressure 127/71.  Weight is 162 pounds.  Wt Readings from Last 3 Encounters:  04/12/23 165 lb (74.8 kg)  03/08/23 166 lb 12.8 oz (75.7 kg)  02/03/23 167 lb (75.8 kg)    Physical Exam Vitals reviewed.  HENT:     Head: Normocephalic and atraumatic.  Eyes:     Pupils: Pupils are equal, round, and reactive to light.  Cardiovascular:     Rate and Rhythm: Normal rate and regular rhythm.     Heart sounds: Normal heart sounds.  Pulmonary:     Effort: Pulmonary effort is normal.     Breath sounds:  Normal breath sounds.  Abdominal:     General: Bowel sounds are normal.     Palpations: Abdomen is soft.  Musculoskeletal:        General: No tenderness or deformity. Normal range of motion.     Cervical back: Normal range of motion.  Lymphadenopathy:     Cervical: No cervical adenopathy.  Skin:    General: Skin is warm and dry.     Findings: No erythema or rash.  Neurological:     Mental Status: He is alert and oriented to person, place, and time.  Psychiatric:        Behavior: Behavior normal.        Thought Content: Thought content normal.        Judgment: Judgment normal.      Lab Results  Component Value Date   WBC 17.5 (H) 04/12/2023   HGB 14.1 04/12/2023   HCT 44.3 04/12/2023   MCV 92.5 04/12/2023   PLT 87 (L) 04/12/2023   No results found for: "FERRITIN", "IRON", "TIBC", "UIBC", "IRONPCTSAT" Lab Results  Component Value Date   RETICCTPCT 1.0 06/15/2013   RBC 4.79 04/12/2023   RETICCTABS 47.6 06/15/2013   Lab Results  Component Value Date   KPAFRELGTCHN 22.0 (H) 01/19/2020   LAMBDASER 10.7 01/19/2020   KAPLAMBRATIO 2.06 (H) 01/19/2020   Lab Results  Component Value Date   IGGSERUM 993 04/24/2022   IGA 108 04/24/2022   IGMSERUM 122 04/24/2022   Lab Results  Component Value Date   TOTALPROTELP 6.5 06/15/2013   ALBUMINELP 61.7 06/15/2013   A1GS 3.5 06/15/2013   A2GS 9.8 06/15/2013   BETS 5.3 06/15/2013   BETA2SER 8.7 (H) 06/15/2013   GAMS 11.0 (L) 06/15/2013   MSPIKE Not Observed 01/25/2017   SPEI * 06/15/2013     Chemistry      Component Value Date/Time   NA 133 (L) 04/12/2023 0828   NA 136 10/07/2022 0812   NA 142 06/28/2017 0855   K 5.1 04/12/2023 0828   K 4.5 06/28/2017 0855   CL 97 (L) 04/12/2023 0828   CL 99 06/28/2017 0855   CO2 31 04/12/2023 0828   CO2 32 06/28/2017 0855   BUN 15 04/12/2023 0828   BUN 15 10/07/2022 0812   BUN 15 06/28/2017 0855   CREATININE 1.00 04/12/2023 0828   CREATININE 1.0 06/28/2017 0855      Component  Value Date/Time   CALCIUM 9.4 04/12/2023 0828   CALCIUM 9.2 06/28/2017 0855   ALKPHOS 70 04/12/2023 0828   ALKPHOS 74 06/28/2017 0855   AST 11 (L) 04/12/2023 0828   ALT 6 04/12/2023 0828   ALT 13 06/28/2017 0855  BILITOT 0.8 04/12/2023 0828     Impression and Plan: Lucas [name] is very pleasant 83 year old male with CLL. We have not had to treat him now for over 21 years.   His platelet count is still on the lower side.  I am very impressed that his white cell count is low.  I did look at his blood smear.  I do not see anything that looked suspicious.  We will still get him back in 6 months.  I do not see any indication that we have to treat him.    Josph Macho, MD 2/26/20258:44 AM

## 2023-11-04 ENCOUNTER — Other Ambulatory Visit: Payer: Self-pay

## 2023-11-04 MED ORDER — METOPROLOL SUCCINATE ER 25 MG PO TB24: 37.5000 mg | ORAL_TABLET | Freq: Every day | ORAL | 0 refills | Status: DC

## 2023-12-28 ENCOUNTER — Other Ambulatory Visit: Payer: Self-pay | Admitting: Cardiovascular Disease

## 2023-12-28 DIAGNOSIS — I480 Paroxysmal atrial fibrillation: Secondary | ICD-10-CM

## 2023-12-28 NOTE — Telephone Encounter (Signed)
 Prescription refill request for Eliquis  received. Indication:afib Last office visit:2/25 Scr:0.97  2/25 Age: 83 Weight:73.5  kg  Prescription refilled

## 2024-01-06 ENCOUNTER — Encounter: Payer: Self-pay | Admitting: Cardiovascular Disease

## 2024-01-10 ENCOUNTER — Other Ambulatory Visit: Payer: Self-pay | Admitting: Cardiovascular Disease

## 2024-02-09 ENCOUNTER — Other Ambulatory Visit: Payer: Self-pay | Admitting: Cardiovascular Disease

## 2024-04-16 ENCOUNTER — Other Ambulatory Visit: Payer: Self-pay | Admitting: Cardiovascular Disease

## 2024-04-19 ENCOUNTER — Inpatient Hospital Stay: Payer: Medicare Other | Admitting: Hematology & Oncology

## 2024-04-19 ENCOUNTER — Encounter: Payer: Self-pay | Admitting: Hematology & Oncology

## 2024-04-19 ENCOUNTER — Inpatient Hospital Stay: Payer: Medicare Other | Attending: Hematology & Oncology

## 2024-04-19 VITALS — BP 128/73 | HR 56 | Temp 97.7°F | Resp 20 | Ht 72.0 in | Wt 168.1 lb

## 2024-04-19 DIAGNOSIS — C9110 Chronic lymphocytic leukemia of B-cell type not having achieved remission: Secondary | ICD-10-CM | POA: Insufficient documentation

## 2024-04-19 LAB — CMP (CANCER CENTER ONLY)
ALT: 11 U/L (ref 0–44)
AST: 16 U/L (ref 15–41)
Albumin: 4.4 g/dL (ref 3.5–5.0)
Alkaline Phosphatase: 86 U/L (ref 38–126)
Anion gap: 7 (ref 5–15)
BUN: 15 mg/dL (ref 8–23)
CO2: 29 mmol/L (ref 22–32)
Calcium: 9.2 mg/dL (ref 8.9–10.3)
Chloride: 98 mmol/L (ref 98–111)
Creatinine: 1.04 mg/dL (ref 0.61–1.24)
GFR, Estimated: >60 mL/min (ref >60)
Glucose, Bld: 148 mg/dL — ABNORMAL HIGH (ref 70–99)
Potassium: 4.8 mmol/L (ref 3.5–5.1)
Sodium: 135 mmol/L (ref 135–145)
Total Bilirubin: 0.7 mg/dL (ref 0.0–1.2)
Total Protein: 7.1 g/dL (ref 6.5–8.1)

## 2024-04-19 LAB — CBC WITH DIFFERENTIAL (CANCER CENTER ONLY)
Abs Immature Granulocytes: 0.03 K/uL (ref 0.00–0.07)
Basophils Absolute: 0 K/uL (ref 0.0–0.1)
Basophils Relative: 0 %
Eosinophils Absolute: 0 K/uL (ref 0.0–0.5)
Eosinophils Relative: 0 %
HCT: 43.3 % (ref 39.0–52.0)
Hemoglobin: 13.9 g/dL (ref 13.0–17.0)
Immature Granulocytes: 0 %
Lymphocytes Relative: 64 %
Lymphs Abs: 6.4 K/uL — ABNORMAL HIGH (ref 0.7–4.0)
MCH: 29.2 pg (ref 26.0–34.0)
MCHC: 32.1 g/dL (ref 30.0–36.0)
MCV: 91 fL (ref 80.0–100.0)
Monocytes Absolute: 0.5 K/uL (ref 0.1–1.0)
Monocytes Relative: 5 %
Neutro Abs: 3.1 K/uL (ref 1.7–7.7)
Neutrophils Relative %: 31 %
Platelet Count: 92 K/uL — ABNORMAL LOW (ref 150–400)
RBC: 4.76 MIL/uL (ref 4.22–5.81)
RDW: 14 % (ref 11.5–15.5)
Smear Review: NORMAL
WBC Count: 10.1 K/uL (ref 4.0–10.5)
nRBC: 0 % (ref 0.0–0.2)

## 2024-04-19 LAB — LACTATE DEHYDROGENASE: LDH: 150 U/L (ref 98–192)

## 2024-04-19 LAB — SAVE SMEAR(SSMR), FOR PROVIDER SLIDE REVIEW

## 2024-04-19 NOTE — Progress Notes (Signed)
 Hematology and Oncology Follow Up Visit  Lucas [name] [id number] [birth year] 83 y.o. 04/19/2024   Principle Diagnosis:  1. Chronic lymphocytic leukemia - stage C  Current Therapy:   Observation    Interim History:  Lucas [name] is here today for a follow-up.  We see him every 6 months.  As always, he travels.  He and Lucas wife travel quite a bit.  They just got back from [place].  They are up there for 2 weeks.  As always, they had a wonderful time up there.  He is doing quite well.  Lucas [name] apparently had COVID.  Thankfully he did not get it himself.  He has had no problems with fever.  He has had no problems with nausea or vomiting.  Has had no change in bowel or bladder habits.  He has had no rashes.  He is had no bleeding.  He continues on anticoagulation for the atrial fibrillation.  Overall, I would say that Lucas performance status is ECOG 1.     Medications:  Allergies as of 04/19/2024       Reactions   Nsaids Other (See Comments)   Told to avoid all NSAIDs while taking Eliquis    Shellfish Allergy Anaphylaxis, Swelling   Adhesive [tape] Rash, Other (See Comments)   Redness Irritation    Latex Rash   tape        Medication List        Accurate as of April 19, 2024  9:07 AM. If you have any questions, ask your nurse or doctor.          Accu-Chek Aviva Plus test strip Generic drug: glucose blood 1 each daily.   Accu-Chek Softclix Lancets lancets USE AS DIRECTED TO TEST BLOOD SUGARS ONCE DAILY for 90 days   brimonidine  0.2 % ophthalmic solution Commonly known as: ALPHAGAN  Place 1 drop into the left eye 2 (two) times daily.   Eliquis  5 MG Tabs tablet Generic drug: apixaban  TAKE 1 TABLET BY MOUTH TWICE  DAILY   EPINEPHrine 0.3 mg/0.3 mL Soaj injection Commonly known as: EPI-PEN Inject 0.3 mg into the muscle as needed for anaphylaxis.   ketoconazole 2 % cream Commonly known as: NIZORAL Apply 1 Application topically daily as needed for  irritation (anti-fungal).   metFORMIN 500 MG 24 hr tablet Commonly known as: GLUCOPHAGE-XR Take 500 mg by mouth at bedtime. What changed: when to take this   metoprolol  succinate 25 MG 24 hr tablet Commonly known as: TOPROL -XL TAKE 1 AND 1/2 TABLETS BY MOUTH  DAILY   Synthroid 25 MCG tablet Generic drug: levothyroxine Take 25 mcg by mouth daily before breakfast.   timolol  0.5 % ophthalmic solution Commonly known as: TIMOPTIC  Place 1 drop into the left eye daily.        Allergies:  Allergies  Allergen Reactions   Nsaids Other (See Comments)    Told to avoid all NSAIDs while taking Eliquis    Shellfish Allergy Anaphylaxis and Swelling   Adhesive [Tape] Rash and Other (See Comments)    Redness Irritation    Latex Rash    tape    Past Medical History, Surgical history, Social history, and Family History were reviewed and updated.  Review of Systems: Review of Systems  Constitutional: Negative.   HENT: Negative.    Eyes: Negative.   Respiratory: Negative.    Cardiovascular: Negative.   Gastrointestinal: Negative.   Genitourinary: Negative.   Musculoskeletal: Negative.   Skin: Negative.   Neurological: Negative.  Endo/Heme/Allergies: Negative.   Psychiatric/Behavioral: Negative.       Physical Exam:  Temperature is 97.7.  Pulse 56.  Blood pressure 128/73.  Weight is 168 pounds.    Wt Readings from Last 3 Encounters:  04/19/24 168 lb 1.9 oz (76.3 kg)  10/13/23 162 lb (73.5 kg)  04/12/23 165 lb (74.8 kg)    Physical Exam Vitals reviewed.  HENT:     Head: Normocephalic and atraumatic.  Eyes:     Pupils: Pupils are equal, round, and reactive to light.  Cardiovascular:     Rate and Rhythm: Normal rate and regular rhythm.     Heart sounds: Normal heart sounds.  Pulmonary:     Effort: Pulmonary effort is normal.     Breath sounds: Normal breath sounds.  Abdominal:     General: Bowel sounds are normal.     Palpations: Abdomen is soft.   Musculoskeletal:        General: No tenderness or deformity. Normal range of motion.     Cervical back: Normal range of motion.  Lymphadenopathy:     Cervical: No cervical adenopathy.  Skin:    General: Skin is warm and dry.     Findings: No erythema or rash.  Neurological:     Mental Status: He is alert and oriented to person, place, and time.  Psychiatric:        Behavior: Behavior normal.        Thought Content: Thought content normal.        Judgment: Judgment normal.      Lab Results  Component Value Date   WBC 10.1 04/19/2024   HGB 13.9 04/19/2024   HCT 43.3 04/19/2024   MCV 91.0 04/19/2024   PLT 92 (L) 04/19/2024   No results found for: FERRITIN, IRON, TIBC, UIBC, IRONPCTSAT Lab Results  Component Value Date   RETICCTPCT 1.0 06/15/2013   RBC 4.76 04/19/2024   RETICCTABS 47.6 06/15/2013   Lab Results  Component Value Date   KPAFRELGTCHN 22.0 (H) 01/19/2020   LAMBDASER 10.7 01/19/2020   KAPLAMBRATIO 2.06 (H) 01/19/2020   Lab Results  Component Value Date   IGGSERUM 993 04/24/2022   IGA 108 04/24/2022   IGMSERUM 122 04/24/2022   Lab Results  Component Value Date   TOTALPROTELP 6.5 06/15/2013   ALBUMINELP 61.7 06/15/2013   A1GS 3.5 06/15/2013   A2GS 9.8 06/15/2013   BETS 5.3 06/15/2013   BETA2SER 8.7 (H) 06/15/2013   GAMS 11.0 (L) 06/15/2013   MSPIKE Not Observed 01/25/2017   SPEI * 06/15/2013     Chemistry      Component Value Date/Time   NA 136 10/13/2023 0833   NA 136 10/07/2022 0812   NA 142 06/28/2017 0855   K 5.0 10/13/2023 0833   K 4.5 06/28/2017 0855   CL 97 (L) 10/13/2023 0833   CL 99 06/28/2017 0855   CO2 34 (H) 10/13/2023 0833   CO2 32 06/28/2017 0855   BUN 15 10/13/2023 0833   BUN 15 10/07/2022 0812   BUN 15 06/28/2017 0855   CREATININE 0.97 10/13/2023 0833   CREATININE 1.0 06/28/2017 0855      Component Value Date/Time   CALCIUM 10.0 10/13/2023 0833   CALCIUM 9.2 06/28/2017 0855   ALKPHOS 66 10/13/2023 0833    ALKPHOS 74 06/28/2017 0855   AST 11 (L) 10/13/2023 0833   ALT 10 10/13/2023 0833   ALT 13 06/28/2017 0855   BILITOT 0.8 10/13/2023 0833     Impression and  Plan: Lucas [name] is very pleasant 83 year old male with CLL. We have not had to treat him now for over 21 years.   From my point of view, everything is holding nice and steady.  Lucas white cell count is holding stable at 10.  Platelet count is holding stable at 92.  I looked at Lucas blood smear under the microscope and did not see anything that looked like he was active.  As always, we will plan to get him back in another 6 months.  I know that he will have a wonderful autumn and a wonderful Holiday season.  Next year he is planning on going to [place].  I am sure that he will have a wonderful time in that part of [place].     Maude JONELLE Crease, MD 9/3/20259:07 AM

## 2024-05-18 ENCOUNTER — Other Ambulatory Visit: Payer: Self-pay | Admitting: Cardiovascular Disease

## 2024-05-23 ENCOUNTER — Encounter: Payer: Self-pay | Admitting: Cardiovascular Disease

## 2024-05-23 ENCOUNTER — Ambulatory Visit: Attending: Cardiovascular Disease | Admitting: Cardiovascular Disease

## 2024-05-23 VITALS — BP 117/71 | HR 66 | Resp 16 | Ht 72.0 in | Wt 165.4 lb

## 2024-05-23 DIAGNOSIS — I480 Paroxysmal atrial fibrillation: Secondary | ICD-10-CM | POA: Diagnosis not present

## 2024-05-23 DIAGNOSIS — I493 Ventricular premature depolarization: Secondary | ICD-10-CM | POA: Diagnosis not present

## 2024-05-23 MED ORDER — METOPROLOL SUCCINATE ER 25 MG PO TB24: 37.5000 mg | ORAL_TABLET | Freq: Every day | ORAL | 4 refills | Status: AC

## 2024-05-23 MED ORDER — METOPROLOL SUCCINATE ER 25 MG PO TB24: 37.5000 mg | ORAL_TABLET | Freq: Every day | ORAL | 4 refills | Status: DC

## 2024-05-23 NOTE — Progress Notes (Signed)
 05/23/2024 AUTHUR CUBIT   1941-08-16  [id number]  Primary Physician Shepard Ade, MD Primary Cardiologist: Dorn JINNY Lesches MD FACP, [place], [place], [place]  HPI:  Lucas [name] is a 83 y.o.    thin and fit-appearing, married Caucasian male, father to 1 stepchild, grandfather to 2 step-grandchildren who I last saw in the office 02/03/2023.  He is accompanied by his wife Leita today.  He had diagnostic cath performed by me in January after a positive Myoview  that revealed normal coronary arteries, normal LV function suggesting a false positive test. He has PAF with RVR in the past with atypical chest pain. He also has CLL which is quiescent followed by Dr. Maude Crease. He did have a 3-week photo shoot safari in [place] in the [place] after I cath'd him and has taken [employer]. He is otherwise active and asymptomatic. His most recent lab work performed in January revealed total cholesterol 168, LDL 107, HDL 44. He was admitted to [facility] on 06/27/13 by Dr. Lavona for A. Fib. Plans were to convert him with oral flecainide  however he converted on his own. He was seen in the [facility] ER by Dr. Francyne. Since I saw him in the office one year ago he has had one episode of PAF in March of 2015 while he was in [place]. His travel plans in the upcoming future included [place], [place], [place]. [place] in [place] in May followed by [place] in September.  he was recently in [place] and had an episode of PAF on July 23 well on a train and a dining car. His most recent episode was on 04/13/17 after being woken up from a nap with rapid heart rate. This took 5 hours to spontaneously convert.    His travels continued to  [place] and [place] as well as [place] and [place] in August.  2019 .   Unfortunately, because of COVID-19 his traveling has been put on hold although he is still active.  He continues to play golf and pickleball.  He had arthroscopic surgery on his right knee in June  for torn meniscus.    He did travel to [place] since I saw him last to [place] in [place] and is planning to leave this coming Friday to [place] and [place].  He is completely asymptomatic.  Did have 5 episodes of A-fib over the last year the last 1 being 08/14/2021 lasting for several hours, and resolving spontaneously.  He is on Eliquis  oral anticoagulation.   He had an episode of syncope and was evaluated in the emergency room 06/04/2022.  He had a 2D echo which was essentially normal and an event monitor performed 06/15/2022 that showed runs of A-fib with RVR associated with dizziness.  He .  He traveled to [place], [place] in [place] on January 7 and is scheduled [place] and [place] in July.   He had A-fib ablation 10/20/2022 and had several short episodes of A-fib in the weeks following that but none since.  He did wear a Holter monitor after that that showed no recurrent A-fib.  Since I saw him a year ago he is remaining active.  He still works at [employer]  He denies chest pain or shortness of breath.  His next trip he is scheduling is for [place].  Current Meds  Medication Sig   ACCU-CHEK AVIVA PLUS test strip 1 each daily.   Accu-Chek Softclix Lancets lancets USE AS DIRECTED TO TEST BLOOD SUGARS ONCE DAILY  for 90 days   brimonidine  (ALPHAGAN ) 0.2 % ophthalmic solution Place 1 drop into the left eye 2 (two) times daily.    ELIQUIS  5 MG TABS tablet TAKE 1 TABLET BY MOUTH TWICE  DAILY   EPINEPHrine 0.3 mg/0.3 mL IJ SOAJ injection Inject 0.3 mg into the muscle as needed for anaphylaxis.   ketoconazole (NIZORAL) 2 % cream Apply 1 Application topically daily as needed for irritation (anti-fungal).   levothyroxine (SYNTHROID) 25 MCG tablet Take 25 mcg by mouth daily before breakfast.   metFORMIN (GLUCOPHAGE-XR) 500 MG 24 hr tablet Take 500 mg by mouth at bedtime. (Patient taking differently: Take 500 mg by mouth 2 (two) times daily with a meal.)   timolol  (TIMOPTIC )  0.5 % ophthalmic solution Place 1 drop into the left eye daily.   [DISCONTINUED] metoprolol  succinate (TOPROL -XL) 25 MG 24 hr tablet TAKE 1 AND 1/2 TABLETS BY MOUTH  DAILY     Allergies  Allergen Reactions   Nsaids Other (See Comments)    Told to avoid all NSAIDs while taking Eliquis    Shellfish Allergy Anaphylaxis and Swelling   Adhesive [Tape] Rash and Other (See Comments)    Redness Irritation    Latex Rash    tape    Social History   Socioeconomic History   Marital status: Married    Spouse name: Not on file   Number of children: 1   Years of education: Not on file   Highest education level: Not on file  Occupational History    Employer: RETIRED  Tobacco Use   Smoking status: Former    Current packs/day: 0.00    Average packs/day: 1 pack/day for 3.2 years (3.2 ttl pk-yrs)    Types: Cigarettes    Start date: 09/19/1962    Quit date: 12/17/1965    Years since quitting: 58.4   Smokeless tobacco: Never   Tobacco comments:    Former smoker 11/17/22  Vaping Use   Vaping status: Never Used  Substance and Sexual Activity   Alcohol  use: Yes    Alcohol /week: 5.0 standard drinks of alcohol     Types: 5 Glasses of wine per week    Comment: 5 glasses of wine weekly 06/29/22   Drug use: No   Sexual activity: Yes  Other Topics Concern   Not on file  Social History Narrative   Married Caucasian male, father to 1 stepchild, grandfather to 2 step-grandchildren.   Lives with his wife.   Travels a lot & enjoys photography.   Social Drivers of Corporate investment banker Strain: Not on file  Food Insecurity: Low Risk  (05/18/2023)   Received from [facility]   Hunger Vital Sign    Within the past 12 months, you worried that your food would run out before you got money to buy more: Never true    Within the past 12 months, the food you bought just didn't last and you didn't have money to get more. : Never true  Transportation Needs: No Transportation Needs (05/18/2023)    Received from [facility]    In the past 12 months, has lack of reliable transportation kept you from medical appointments, meetings, work or from getting things needed for daily living? : No  Physical Activity: Not on file  Stress: Not on file  Social Connections: Not on file  Intimate Partner Violence: Not on file     Review of Systems: General: negative for chills, fever, night sweats or weight changes.  Cardiovascular: negative for chest pain, dyspnea on exertion, edema, orthopnea, palpitations, paroxysmal nocturnal dyspnea or shortness of breath Dermatological: negative for rash Respiratory: negative for cough or wheezing Urologic: negative for hematuria Abdominal: negative for nausea, vomiting, diarrhea, bright red blood per rectum, melena, or hematemesis Neurologic: negative for visual changes, syncope, or dizziness All other systems reviewed and are otherwise negative except as noted above.    Blood pressure 117/71, pulse 66, resp. rate 16, height 6' (1.829 m), weight 165 lb 6.4 oz (75 kg), SpO2 94%.  General appearance: alert and no distress Neck: no adenopathy, no carotid bruit, no JVD, supple, symmetrical, trachea midline, and thyroid  not enlarged, symmetric, no tenderness/mass/nodules Lungs: clear to auscultation bilaterally Heart: regular rate and rhythm, S1, S2 normal, no murmur, click, rub or gallop Extremities: extremities normal, atraumatic, no cyanosis or edema Pulses: 2+ and symmetric Skin: Skin color, texture, turgor normal. No rashes or lesions Neurologic: Grossly normal  EKG EKG Interpretation Date/Time:  Tuesday May 23 2024 11:08:14 EDT Ventricular Rate:  65 PR Interval:  174 QRS Duration:  96 QT Interval:  400 QTC Calculation: 416 R Axis:   9  Text Interpretation: Normal sinus rhythm Normal ECG When compared with ECG of 08-Mar-2023 14:46, No significant change was found Confirmed by Court Carrier [phone]) on 05/23/2024 11:38:35  AM    ASSESSMENT AND PLAN:   Paroxysmal atrial fibrillation (HCC) History of PAF status post ablation by Dr. Nancey 10/20/2023 with a subsequent monitor that showed no A-fib.  He has remained in sinus rhythm on Eliquis  oral anticoagulation.     Carrier DOROTHA Court MD FACP,FACC,FAHA, [facility] 05/23/2024 11:46 AM

## 2024-05-23 NOTE — Patient Instructions (Signed)

## 2024-05-23 NOTE — Addendum Note (Signed)
 Addended by: LORRENE FEDERICO CROME on: 05/23/2024 11:49 AM   Modules accepted: Orders

## 2024-05-23 NOTE — Assessment & Plan Note (Signed)
 History of PAF status post ablation by Dr. Nancey 10/20/2023 with a subsequent monitor that showed no A-fib.  He has remained in sinus rhythm on Eliquis  oral anticoagulation.

## 2024-08-18 NOTE — Progress Notes (Signed)
 Steroid response with IOP spikes OD  - into ~50 on prednisone, otherwise to upper 20s + fam hx of glaucoma (father) Uveitis OD and chronic lymphocytic leukemia  - diagnosed in 1999 [place] choroidopathy, per Dr. Chalice CCT=580/595 s/p phaco/PCIOL w/ Ahmed/SPG OD 10/06/10 s/p YAG capsulotomy OD 09/08/16 s/p phaco/toric w/ Dr. Luke 3/18 s/p YAG caps OS 10/14/20  Recent flare-up of iritis OD  - now using durezol tid OD  With 2mm tube erosion ST OD  IOP= 14/17 using timolol  qam/alphagan  bid OS  HVF 2/25 OD: superior cluster, MD=-5.28, Fovea=37 OS: mild nasal depression, MD=-4.14, Fovea= 33 - VFi stable OU with h/o flux OU  OCT 11/23 OD: ST/IT/G/IN ONL, general uniform thinning, probable artifact OS: artifact + ERM OD>OS  ONs 2/25 OD: 0.6 with mild IT slope OS: 0.55 with mild temporal slope/PPA  1+ PCO OS  - s/p YAG capsulotomy OS 10/14/20  Stable IOPs and HVFs Continue timolol  and brim OS - will message/call for refill Uveitis stable OD, pt OK to self-initiate durezol gtts prn  Now with 2mm tube erosion ST OD - rare cell - durezol to qDay OD  for 1 week, then d/c - restart moxifloxacin tid OD  Schedule for tube revision w/ SPG OD  RTC OR

## 2024-09-15 ENCOUNTER — Encounter: Payer: Self-pay | Admitting: Cardiovascular Disease

## 2024-09-15 NOTE — Unmapped External Note (Addendum)
 Medication instructions prior to procedure:    Medication Sig INSTRUCTIONS   brimonidine -timoloL  (COMBIGAN) 0.2-0.5 % ophthalmic solution 1 drop 2 (two) times daily Take as prescribed   ELIQUIS  5 mg tablet Take 5 mg by mouth every 12 (twelve) hours    Follow the prescriber's/cardiologist's instructions   levothyroxine (SYNTHROID) 25 MCG tablet Take 25 mcg by mouth TAKE day of procedure   metFORMIN (GLUCOPHAGE) 500 MG tablet Take 500 mg by mouth 2 (two) times daily with meals DO NOT TAKE day of procedure   metoprolol  succinate (TOPROL -XL) 25 MG XL tablet Use as directed 1 capsule in the mouth or throat Daily Takes 1.5 tab a day TAKE day of procedure   sildenafil  (VIAGRA ) 50 MG tablet Take by mouth. HOLD 48 hours prior to day of surgery   timoloL  maleate (TIMOPTIC ) 0.5 % ophthalmic solution INSTILL 1 DROP INTO THE LEFT EYE ONCE DAILY Take as prescribed       Patient Instructions  Pre-operative Instructions for [facility]:  A nurse from the [facility] will call you the afternoon of the business day prior to surgery to give you the time of arrival on the day of surgery.  That nurse will also provide eating and drinking instructions for surgery.  If you do not receive a telephone call by 5:00 pm the business day before your surgery, please call the [facility] at [phone] and ask to speak with a pre-op nurse.    If you received bathing instructions/sponges from your surgeon, please follow those instructions.  If you did not receive bathing instructions/sponges from your surgeon, please take a shower with an antibacterial soap (ie. Dial) the night before and the morning of surgery.  After that morning shower, do not apply deodorant, make up, lotion, perfumes or powder to your skin. Do not wear contact lenses.  All jewelry, including body jewelry, and nail polish should to be removed prior to surgery.  On the day of surgery, please bring a government-issued photo ID, insurance card and  any payment you may owe.  A responsible adult (51 years of age or older) must accompany each patient day of surgery and stay the entire time.    [facility] is located at [address] [place], [place]  [address]. The parking garage is on the right when you arrive at the above address.  Upon entering the building, please report to the 3rd floor and check in at the desk.

## 2024-09-18 NOTE — Telephone Encounter (Signed)
 OK to hold Eliquis  for 2-3 days prior to eye surgery  Court Dorn PARAS MD  09/18/24  2:30 PM

## 2024-10-12 ENCOUNTER — Inpatient Hospital Stay

## 2024-10-12 ENCOUNTER — Inpatient Hospital Stay: Admitting: Hematology & Oncology

## 2024-10-18 ENCOUNTER — Inpatient Hospital Stay

## 2024-10-18 ENCOUNTER — Ambulatory Visit: Admitting: Hematology & Oncology
